# Patient Record
Sex: Male | Born: 1960 | Race: White | Hispanic: No | Smoking: Never smoker
Health system: Southern US, Community
[De-identification: ages and names within clinical notes are randomized; demographics above are authoritative.]

## PROBLEM LIST (undated history)

## (undated) DIAGNOSIS — I2510 Atherosclerotic heart disease of native coronary artery without angina pectoris: Secondary | ICD-10-CM

## (undated) DIAGNOSIS — M109 Gout, unspecified: Secondary | ICD-10-CM

## (undated) DIAGNOSIS — I255 Ischemic cardiomyopathy: Secondary | ICD-10-CM

## (undated) DIAGNOSIS — G4730 Sleep apnea, unspecified: Secondary | ICD-10-CM

## (undated) DIAGNOSIS — E785 Hyperlipidemia, unspecified: Secondary | ICD-10-CM

## (undated) HISTORY — DX: Gout, unspecified: M10.9

## (undated) HISTORY — PX: CARDIAC CATHETERIZATION: SHX172

## (undated) HISTORY — PX: KNEE SURGERY: SHX244

## (undated) HISTORY — PX: KNEE ARTHROSCOPY: SHX127

## (undated) HISTORY — DX: Sleep apnea, unspecified: G47.30

## (undated) HISTORY — PX: TONSILLECTOMY AND ADENOIDECTOMY: SUR1326

---

## 1998-10-21 ENCOUNTER — Emergency Department (HOSPITAL_COMMUNITY): Admission: EM | Admit: 1998-10-21 | Discharge: 1998-10-21 | Payer: Self-pay | Admitting: Emergency Medicine

## 1998-11-30 ENCOUNTER — Emergency Department (HOSPITAL_COMMUNITY): Admission: EM | Admit: 1998-11-30 | Discharge: 1998-11-30 | Payer: Self-pay | Admitting: Internal Medicine

## 1999-01-09 ENCOUNTER — Ambulatory Visit (HOSPITAL_COMMUNITY): Admission: RE | Admit: 1999-01-09 | Discharge: 1999-01-09 | Payer: Self-pay

## 1999-10-29 ENCOUNTER — Encounter: Payer: Self-pay | Admitting: Emergency Medicine

## 1999-10-29 ENCOUNTER — Emergency Department (HOSPITAL_COMMUNITY): Admission: EM | Admit: 1999-10-29 | Discharge: 1999-10-29 | Payer: Self-pay | Admitting: *Deleted

## 2000-06-23 ENCOUNTER — Encounter: Admission: RE | Admit: 2000-06-23 | Discharge: 2000-09-21 | Payer: Self-pay | Admitting: Internal Medicine

## 2003-01-13 ENCOUNTER — Encounter: Payer: Self-pay | Admitting: Internal Medicine

## 2003-01-13 ENCOUNTER — Encounter: Admission: RE | Admit: 2003-01-13 | Discharge: 2003-01-13 | Payer: Self-pay | Admitting: Internal Medicine

## 2004-03-29 ENCOUNTER — Emergency Department (HOSPITAL_COMMUNITY): Admission: EM | Admit: 2004-03-29 | Discharge: 2004-03-29 | Payer: Self-pay | Admitting: Emergency Medicine

## 2006-08-18 ENCOUNTER — Emergency Department (HOSPITAL_COMMUNITY): Admission: EM | Admit: 2006-08-18 | Discharge: 2006-08-19 | Payer: Self-pay | Admitting: Emergency Medicine

## 2009-05-06 HISTORY — PX: SHOULDER SURGERY: SHX246

## 2009-10-05 ENCOUNTER — Ambulatory Visit (HOSPITAL_COMMUNITY): Admission: RE | Admit: 2009-10-05 | Discharge: 2009-10-05 | Payer: Self-pay | Admitting: Specialist

## 2010-07-23 LAB — CBC
MCHC: 34.3 g/dL (ref 30.0–36.0)
RBC: 5.17 MIL/uL (ref 4.22–5.81)
WBC: 5 10*3/uL (ref 4.0–10.5)

## 2010-07-23 LAB — BASIC METABOLIC PANEL
CO2: 28 mEq/L (ref 19–32)
Calcium: 9.3 mg/dL (ref 8.4–10.5)
GFR calc non Af Amer: >60 mL/min (ref >60)
Potassium: 4.1 mEq/L (ref 3.5–5.1)

## 2011-11-28 ENCOUNTER — Ambulatory Visit
Admission: RE | Admit: 2011-11-28 | Discharge: 2011-11-28 | Disposition: A | Discharge status: Home / Self Care | Payer: BC Managed Care – PPO | Source: Ambulatory Visit | Attending: Internal Medicine | Admitting: Internal Medicine

## 2011-11-28 ENCOUNTER — Other Ambulatory Visit: Payer: Self-pay | Admitting: Internal Medicine

## 2011-11-28 DIAGNOSIS — R609 Edema, unspecified: Secondary | ICD-10-CM

## 2011-11-28 DIAGNOSIS — R52 Pain, unspecified: Secondary | ICD-10-CM

## 2013-01-22 ENCOUNTER — Ambulatory Visit: Payer: BC Managed Care – PPO | Admitting: Family Medicine

## 2013-02-17 ENCOUNTER — Telehealth: Payer: Self-pay

## 2013-02-17 NOTE — Telephone Encounter (Signed)
New Patient-LM for CB 

## 2013-02-18 ENCOUNTER — Encounter: Payer: Self-pay | Admitting: Family Medicine

## 2013-02-18 ENCOUNTER — Ambulatory Visit (INDEPENDENT_AMBULATORY_CARE_PROVIDER_SITE_OTHER): Payer: BC Managed Care – PPO | Admitting: Family Medicine

## 2013-02-18 VITALS — BP 114/64 | HR 86 | Temp 97.8°F | Ht 71.0 in | Wt 222.0 lb

## 2013-02-18 DIAGNOSIS — Z23 Encounter for immunization: Secondary | ICD-10-CM

## 2013-02-18 DIAGNOSIS — E669 Obesity, unspecified: Secondary | ICD-10-CM | POA: Insufficient documentation

## 2013-02-18 DIAGNOSIS — M109 Gout, unspecified: Secondary | ICD-10-CM

## 2013-02-18 DIAGNOSIS — Z0000 Encounter for general adult medical examination without abnormal findings: Secondary | ICD-10-CM

## 2013-02-18 LAB — HEPATIC FUNCTION PANEL
ALT: 23 U/L (ref 0–53)
Bilirubin, Direct: 0.1 mg/dL (ref 0.0–0.3)
Total Bilirubin: 0.9 mg/dL (ref 0.3–1.2)

## 2013-02-18 LAB — CBC WITH DIFFERENTIAL/PLATELET
Basophils Absolute: 0 10*3/uL (ref 0.0–0.1)
Basophils Relative: 0.5 % (ref 0.0–3.0)
Eosinophils Relative: 2 % (ref 0.0–5.0)
HCT: 45.6 % (ref 39.0–52.0)
Lymphocytes Relative: 21.7 % (ref 12.0–46.0)
MCHC: 34.8 g/dL (ref 30.0–36.0)
MCV: 85.8 fl (ref 78.0–100.0)
Monocytes Absolute: 0.6 10*3/uL (ref 0.1–1.0)
Monocytes Relative: 11.2 % (ref 3.0–12.0)
Platelets: 209 10*3/uL (ref 150.0–400.0)
RDW: 12.6 % (ref 11.5–14.6)
WBC: 5.4 10*3/uL (ref 4.5–10.5)

## 2013-02-18 LAB — POCT URINALYSIS DIPSTICK
Glucose, UA: NEGATIVE
Protein, UA: NEGATIVE
Spec Grav, UA: 1.01
Urobilinogen, UA: 0.2
pH, UA: 6

## 2013-02-18 LAB — BASIC METABOLIC PANEL
Chloride: 101 mEq/L (ref 96–112)
GFR: 83.82 mL/min (ref >60.00)
Sodium: 139 mEq/L (ref 135–145)

## 2013-02-18 LAB — LIPID PANEL
Cholesterol: 228 mg/dL — ABNORMAL HIGH (ref 0–200)
Total CHOL/HDL Ratio: 6
Triglycerides: 170 mg/dL — ABNORMAL HIGH (ref 0.0–149.0)

## 2013-02-18 LAB — URIC ACID: Uric Acid, Serum: 7.8 mg/dL (ref 4.0–7.8)

## 2013-02-18 LAB — TSH: TSH: 1.91 u[IU]/mL (ref 0.35–5.50)

## 2013-02-18 LAB — PSA: PSA: 0.82 ng/mL (ref 0.10–4.00)

## 2013-02-18 MED ORDER — ALLOPURINOL 100 MG PO TABS: 100.0000 mg | ORAL_TABLET | Freq: Every day | ORAL | Status: DC

## 2013-02-18 NOTE — Assessment & Plan Note (Signed)
Check uric acid. 

## 2013-02-18 NOTE — Progress Notes (Signed)
Subjective:    Patient ID: Leroy Sou., male    DOB: 07-01-1960, 52 y.o.   MRN: [id number]  HPI Pt here for cpe  No complaintsl  Past Medical History  Diagnosis Date  . Gout    Family History  Problem Relation Age of Onset  . Heart disease Maternal Grandfather   . Diabetes Paternal Grandmother   . Heart disease Mother     Blockage   History   Social History  . Marital Status: Married    Spouse Name: N/A    Number of Children: N/A  . Years of Education: N/A   Occupational History  . Not on file.   Social History Main Topics  . Smoking status: Never Smoker   . Smokeless tobacco: Never Used  . Alcohol Use: Yes     Comment: Rarely  . Drug Use: No  . Sexual Activity: Yes    Partners: Female   Other Topics Concern  . Not on file   Social History Narrative   Exercise-- bike, coaching basketball,  softball   Family History  Problem Relation Age of Onset  . Heart disease Maternal Grandfather   . Diabetes Paternal Grandmother   . Heart disease Mother     Blockage   No current outpatient prescriptions on file prior to visit.   No current facility-administered medications on file prior to visit.     Review of Systems                                                                                                                                                                                                                                                                         \\\\\\\\\\\   Review of Systems  Constitutional: Negative for activity change, appetite change and fatigue.  HENT: Negative for hearing loss, congestion, tinnitus and ear discharge.  dentist q69m Eyes: Negative for visual disturbance (see optho q1y -- vision corrected to 20/20 with glasses).  Respiratory: Negative for cough, chest tightness and shortness of breath.   Cardiovascular: Negative for chest pain, palpitations and leg swelling.  Gastrointestinal: Negative for abdominal  pain, diarrhea, constipation and abdominal distention.  Genitourinary: Negative for urgency, frequency, decreased urine volume and difficulty urinating.  Musculoskeletal: Negative for back pain, arthralgias and gait problem.  Skin: Negative for color change, pallor and rash.  Neurological: Negative for dizziness, light-headedness, numbness and headaches.  Hematological: Negative for adenopathy. Does not bruise/bleed easily.  Psychiatric/Behavioral: Negative for suicidal ideas, confusion, sleep disturbance, self-injury, dysphoric mood, decreased concentration and agitation.       }     Objective:   Physical Exam BP 114/64  Pulse 86  Temp(Src) 97.8 F (36.6 C) (Oral)  Ht 5\' 11"  (1.803 m)  Wt 222 lb (100.699 kg)  BMI 30.98 kg/m2  SpO2 98% General appearance: alert, cooperative, appears stated age and no distress Head: Normocephalic, without obvious abnormality, atraumatic Eyes: conjunctivae/corneas clear. PERRL, EOM's intact. Fundi benign. Ears: normal TM's and external ear canals both ears Nose: Nares normal. Septum midline. Mucosa normal. No drainage or sinus tenderness. Throat: lips, mucosa, and tongue normal; teeth and gums normal Neck: no adenopathy, no carotid bruit, no JVD, supple, symmetrical, trachea midline and thyroid not enlarged, symmetric, no tenderness/mass/nodules Back: symmetric, no curvature. ROM normal. No CVA tenderness. Lungs: clear to auscultation bilaterally Chest wall: no tenderness Heart: regular rate and rhythm, S1, S2 normal, no murmur, click, rub or gallop Abdomen: soft, non-tender; bowel sounds normal; no masses,  no organomegaly Male genitalia: normal, penis: no lesions or discharge. testes: no masses or tenderness. no hernias Rectal: normal tone, normal prostate, no masses or tenderness and soft brown guaiac negative stool noted Extremities: extremities normal, atraumatic, no cyanosis or edema Pulses: 2+ and symmetric Skin: Skin color, texture,  turgor normal. No rashes or lesions Lymph nodes: Cervical, supraclavicular, and axillary nodes normal. Neurologic: Alert and oriented X 3, normal strength and tone. Normal symmetric reflexes. Normal coordination and gait Psych- no anxietyl, , no depression        Assessment & Plan:  cpe---ghm utd     Check labs    See avs

## 2013-02-18 NOTE — Telephone Encounter (Signed)
Unable to reach prior to visit  

## 2013-02-18 NOTE — Patient Instructions (Signed)
Preventive Care for Adults, Male  A healthy lifestyle and preventive care can promote health and wellness. Preventive health guidelines for men include the following key practices:  · A routine yearly physical is a good way to check with your caregiver about your health and preventative screening. It is a chance to share any concerns and updates on your health, and to receive a thorough exam.  · Visit your dentist for a routine exam and preventative care every 6 months. Brush your teeth twice a day and floss once a day. Good oral hygiene prevents tooth decay and gum disease.  · The frequency of eye exams is based on your age, health, family medical history, use of contact lenses, and other factors. Follow your caregiver's recommendations for frequency of eye exams.  · Eat a healthy diet. Foods like vegetables, fruits, whole grains, low-fat dairy products, and lean protein foods contain the nutrients you need without too many calories. Decrease your intake of foods high in solid fats, added sugars, and salt. Eat the right amount of calories for you. Get information about a proper diet from your caregiver, if necessary.  · Regular physical exercise is one of the most important things you can do for your health. Most adults should get at least 150 minutes of moderate-intensity exercise (any activity that increases your heart rate and causes you to sweat) each week. In addition, most adults need muscle-strengthening exercises on 2 or more days a week.  · Maintain a healthy weight. The body mass index (BMI) is a screening tool to identify possible weight problems. It provides an estimate of body fat based on height and weight. Your caregiver can help determine your BMI, and can help you achieve or maintain a healthy weight. For adults 20 years and older:  · A BMI below 18.5 is considered underweight.  · A BMI of 18.5 to 24.9 is normal.  · A BMI of 25 to 29.9 is considered overweight.  · A BMI of 30 and above is  considered obese.  · Maintain normal blood lipids and cholesterol levels by exercising and minimizing your intake of saturated fat. Eat a balanced diet with plenty of fruit and vegetables. Blood tests for lipids and cholesterol should begin at age 20 and be repeated every 5 years. If your lipid or cholesterol levels are high, you are over 50, or you are a high risk for heart disease, you may need your cholesterol levels checked more frequently. Ongoing high lipid and cholesterol levels should be treated with medicines if diet and exercise are not effective.  · If you smoke, find out from your caregiver how to quit. If you do not use tobacco, do not start.  · If you choose to drink alcohol, do not exceed 2 drinks per day. One drink is considered to be 12 ounces (355 mL) of beer, 5 ounces (148 mL) of wine, or 1.5 ounces (44 mL) of liquor.  · Avoid use of street drugs. Do not share needles with anyone. Ask for help if you need support or instructions about stopping the use of drugs.  · High blood pressure causes heart disease and increases the risk of stroke. Your blood pressure should be checked at least every 1 to 2 years. Ongoing high blood pressure should be treated with medicines, if weight loss and exercise are not effective.  · If you are 45 to 50 years old, ask your caregiver if you should take aspirin to prevent heart disease.  · Diabetes screening involves taking   a blood sample to check your fasting blood sugar level. This should be done once every 3 years, after age 45, if you are within normal weight and without risk factors for diabetes. Testing should be considered at a younger age or be carried out more frequently if you are overweight and have at least 1 risk factor for diabetes.  · Colorectal cancer can be detected and often prevented. Most routine colorectal cancer screening begins at the age of 50 and continues through age 75. However, your caregiver may recommend screening at an earlier age if you  have risk factors for colon cancer. On a yearly basis, your caregiver may provide home test kits to check for hidden blood in the stool. Use of a small camera at the end of a tube, to directly examine the colon (sigmoidoscopy or colonoscopy), can detect the earliest forms of colorectal cancer. Talk to your caregiver about this at age 50, when routine screening begins.  Direct examination of the colon should be repeated every 5 to 10 years through age 75, unless early forms of pre-cancerous polyps or small growths are found.  · Hepatitis C blood testing is recommended for all people born from 1945 through 1965 and any individual with known risks for hepatitis C.  · Practice safe sex. Use condoms and avoid high-risk sexual practices to reduce the spread of sexually transmitted infections (STIs). STIs include gonorrhea, chlamydia, syphilis, trichomonas, herpes, HPV, and human immunodeficiency virus (HIV). Herpes, HIV, and HPV are viral illnesses that have no cure. They can result in disability, cancer, and death.  · A one-time screening for abdominal aortic aneurysm (AAA) and surgical repair of large AAAs by sound wave imaging (ultrasonography) is recommended for ages 65 to 75 years who are current or former smokers.  · Healthy men should no longer receive prostate-specific antigen (PSA) blood tests as part of routine cancer screening. Consult with your caregiver about prostate cancer screening.  · Testicular cancer screening is not recommended for adult males who have no symptoms. Screening includes self-exam, caregiver exam, and other screening tests. Consult with your caregiver about any symptoms you have or any concerns you have about testicular cancer.  · Use sunscreen with skin protection factor (SPF) of 30 or more. Apply sunscreen liberally and repeatedly throughout the day. You should seek shade when your shadow is shorter than you. Protect yourself by wearing long sleeves, pants, a wide-brimmed hat, and  sunglasses year round, whenever you are outdoors.  · Once a month, do a whole body skin exam, using a mirror to look at the skin on your back. Notify your caregiver of new moles, moles that have irregular borders, moles that are larger than a pencil eraser, or moles that have changed in shape or color.  · Stay current with required immunizations.  · Influenza. You need a dose every fall (or winter). The composition of the flu vaccine changes each year, so being vaccinated once is not enough.  · Pneumococcal polysaccharide. You need 1 to 2 doses if you smoke cigarettes or if you have certain chronic medical conditions. You need 1 dose at age 65 (or older) if you have never been vaccinated.  · Tetanus, diphtheria, pertussis (Tdap, Td). Get 1 dose of Tdap vaccine if you are younger than age 65 years, are over 65 and have contact with an infant, are a healthcare worker, or simply want to be protected from whooping cough. After that, you need a Td booster dose every 10 years. Consult your   caregiver if you have not had at least 3 tetanus and diphtheria-containing shots sometime in your life or have a deep or dirty wound.  · HPV. This vaccine is recommended for males 13 through 50 years of age. This vaccine may be given to men 22 through 50 years of age who have not completed the 3 dose series. It is recommended for men through age 26 who have sex with men or whose immune system is weakened because of HIV infection, other illness, or medications. The vaccine is given in 3 doses over 6 months.  · Measles, mumps, rubella (MMR). You need at least 1 dose of MMR if you were born in 1957 or later. You may also need a 2nd dose.  · Meningococcal. If you are age 19 to 21 years and a first-year college student living in a residence hall, or have one of several medical conditions, you need to get vaccinated against meningococcal disease. You may also need additional booster doses.  · Zoster (shingles). If you are age 60 years or  older, you should get this vaccine.  · Varicella (chickenpox). If you have never had chickenpox or you were vaccinated but received only 1 dose, talk to your caregiver to find out if you need this vaccine.  · Hepatitis A. You need this vaccine if you have a specific risk factor for hepatitis A virus infection, or you simply wish to be protected from this disease. The vaccine is usually given as 2 doses, 6 to 18 months apart.  · Hepatitis B. You need this vaccine if you have a specific risk factor for hepatitis B virus infection or you simply wish to be protected from this disease. The vaccine is given in 3 doses, usually over 6 months.  Preventative Service / Frequency  Ages 19 to 39  · Blood pressure check.** / Every 1 to 2 years.  · Lipid and cholesterol check.** / Every 5 years beginning at age 20.  · Hepatitis C blood test.** / For any individual with known risks for hepatitis C.  · Skin self-exam. / Monthly.  · Influenza immunization.** / Every year.  · Pneumococcal polysaccharide immunization.** / 1 to 2 doses if you smoke cigarettes or if you have certain chronic medical conditions.  · Tetanus, diphtheria, pertussis (Tdap,Td) immunization. / A one-time dose of Tdap vaccine. After that, you need a Td booster dose every 10 years.  · HPV immunization. / 3 doses over 6 months, if 26 and younger.  · Measles, mumps, rubella (MMR) immunization. / You need at least 1 dose of MMR if you were born in 1957 or later. You may also need a 2nd dose.  · Meningococcal immunization. / 1 dose if you are age 19 to 21 years and a first-year college student living in a residence hall, or have one of several medical conditions, you need to get vaccinated against meningococcal disease. You may also need additional booster doses.  · Varicella immunization.** / Consult your caregiver.  · Hepatitis A immunization.** / Consult your caregiver. 2 doses, 6 to 18 months apart.  · Hepatitis B immunization.** / Consult your caregiver. 3 doses  usually over 6 months.  Ages 40 to 64  · Blood pressure check.** / Every 1 to 2 years.  · Lipid and cholesterol check.** / Every 5 years beginning at age 20.  · Fecal occult blood test (FOBT) of stool. / Every year beginning at age 50 and continuing until age 75. You may not have   to do this test if you get colonoscopy every 10 years.  · Flexible sigmoidoscopy** or colonoscopy.** / Every 5 years for a flexible sigmoidoscopy or every 10 years for a colonoscopy beginning at age 50 and continuing until age 75.  · Hepatitis C blood test.** / For all people born from 1945 through 1965 and any individual with known risks for hepatitis C.  · Skin self-exam. / Monthly.  · Influenza immunization.** / Every year.  · Pneumococcal polysaccharide immunization.** / 1 to 2 doses if you smoke cigarettes or if you have certain chronic medical conditions.  · Tetanus, diphtheria, pertussis (Tdap/Td) immunization.** / A one-time dose of Tdap vaccine. After that, you need a Td booster dose every 10 years.  · Measles, mumps, rubella (MMR) immunization.  / You need at least 1 dose of MMR if you were born in 1957 or later. You may also need a 2nd dose.  · Varicella immunization.**/ Consult your caregiver.  · Meningococcal immunization.** / Consult your caregiver.  · Hepatitis A immunization.** / Consult your caregiver. 2 doses, 6 to 18 months apart.  · Hepatitis B immunization.** / Consult your caregiver. 3 doses, usually over 6 months.  Ages 65 and over  · Blood pressure check.** / Every 1 to 2 years.  · Lipid and cholesterol check.**/ Every 5 years beginning at age 20.  · Fecal occult blood test (FOBT) of stool. / Every year beginning at age 50 and continuing until age 75. You may not have to do this test if you get colonoscopy every 10 years.  · Flexible sigmoidoscopy** or colonoscopy.** / Every 5 years for a flexible sigmoidoscopy or every 10 years for a colonoscopy beginning at age 50 and continuing until age 75.  · Hepatitis C blood  test.** / For all people born from 1945 through 1965 and any individual with known risks for hepatitis C.  · Abdominal aortic aneurysm (AAA) screening.** / A one-time screening for ages 65 to 75 years who are current or former smokers.  · Skin self-exam. / Monthly.  · Influenza immunization.** / Every year.  · Pneumococcal polysaccharide immunization.** / 1 dose at age 65 (or older) if you have never been vaccinated.  · Tetanus, diphtheria, pertussis (Tdap, Td) immunization. / A one-time dose of Tdap vaccine if you are over 65 and have contact with an infant, are a healthcare worker, or simply want to be protected from whooping cough. After that, you need a Td booster dose every 10 years.  · Varicella immunization. ** / Consult your caregiver.  · Meningococcal immunization.** / Consult your caregiver.  · Hepatitis A immunization. ** / Consult your caregiver. 2 doses, 6 to 18 months apart.  · Hepatitis B immunization.** / Check with your caregiver. 3 doses, usually over 6 months.  **Family history and personal history of risk and conditions may change your caregiver's recommendations.  Document Released: 06/18/2001 Document Revised: 07/15/2011 Document Reviewed: 09/17/2010  [employer]® Patient Information ©2014 [employer].

## 2013-04-06 ENCOUNTER — Telehealth: Payer: Self-pay | Admitting: Family Medicine

## 2013-04-06 DIAGNOSIS — Z0000 Encounter for general adult medical examination without abnormal findings: Secondary | ICD-10-CM

## 2013-04-06 DIAGNOSIS — E785 Hyperlipidemia, unspecified: Secondary | ICD-10-CM

## 2013-04-06 MED ORDER — ALLOPURINOL 100 MG PO TABS: 200.0000 mg | ORAL_TABLET | Freq: Every day | ORAL | Status: DC

## 2013-04-06 NOTE — Telephone Encounter (Signed)
New Rx sent. Orders are in     [place]

## 2013-04-06 NOTE — Telephone Encounter (Addendum)
Patient wife called to request a refill for allopurinol (ZYLOPRIM) 100 MG tablet but states that it suppose to be 2 tablets twice a day according to dr Laury Axon message about his recent lab work. Also patient wife states that he is due for a 3 month lab apt in January.   Pharmacy [facility] on [address]

## 2013-05-21 ENCOUNTER — Other Ambulatory Visit: Payer: BC Managed Care – PPO

## 2013-05-25 ENCOUNTER — Other Ambulatory Visit (INDEPENDENT_AMBULATORY_CARE_PROVIDER_SITE_OTHER): Payer: BC Managed Care – PPO

## 2013-05-25 DIAGNOSIS — E785 Hyperlipidemia, unspecified: Secondary | ICD-10-CM

## 2013-05-25 LAB — BASIC METABOLIC PANEL
BUN: 12 mg/dL (ref 6–23)
CALCIUM: 9.2 mg/dL (ref 8.4–10.5)
CO2: 28 mEq/L (ref 19–32)
Chloride: 104 mEq/L (ref 96–112)
Creatinine, Ser: 1 mg/dL (ref 0.4–1.5)
GFR: 82.78 mL/min (ref >60.00)
Glucose, Bld: 121 mg/dL — ABNORMAL HIGH (ref 70–99)
POTASSIUM: 4.1 meq/L (ref 3.5–5.1)
SODIUM: 138 meq/L (ref 135–145)

## 2013-05-25 LAB — LIPID PANEL
CHOL/HDL RATIO: 5
CHOLESTEROL: 202 mg/dL — AB (ref 0–200)
HDL: 38.2 mg/dL — AB (ref >39.00)
TRIGLYCERIDES: 153 mg/dL — AB (ref 0.0–149.0)
VLDL: 30.6 mg/dL (ref 0.0–40.0)

## 2013-05-25 LAB — HEPATIC FUNCTION PANEL
ALK PHOS: 98 U/L (ref 39–117)
ALT: 36 U/L (ref 0–53)
AST: 36 U/L (ref 0–37)
Albumin: 4.3 g/dL (ref 3.5–5.2)
BILIRUBIN TOTAL: 1.1 mg/dL (ref 0.3–1.2)
Bilirubin, Direct: 0.1 mg/dL (ref 0.0–0.3)
Total Protein: 7.2 g/dL (ref 6.0–8.3)

## 2013-05-25 LAB — LDL CHOLESTEROL, DIRECT: LDL DIRECT: 142 mg/dL

## 2013-05-27 ENCOUNTER — Ambulatory Visit (INDEPENDENT_AMBULATORY_CARE_PROVIDER_SITE_OTHER): Payer: BC Managed Care – PPO | Admitting: Physician Assistant

## 2013-05-27 ENCOUNTER — Encounter: Payer: Self-pay | Admitting: Physician Assistant

## 2013-05-27 VITALS — BP 118/80 | HR 60 | Temp 98.4°F | Wt 222.8 lb

## 2013-05-27 DIAGNOSIS — J209 Acute bronchitis, unspecified: Secondary | ICD-10-CM

## 2013-05-27 MED ORDER — HYDROCOD POLST-CHLORPHEN POLST 10-8 MG/5ML PO LQCR: 5.0000 mL | Freq: Two times a day (BID) | ORAL | Status: DC | PRN

## 2013-05-27 MED ORDER — AZITHROMYCIN 250 MG PO TABS: ORAL_TABLET | ORAL | Status: DC

## 2013-05-27 NOTE — Patient Instructions (Signed)
Take medications as prescribed.  Use Mucinex for chest congestion.  Increase fluid intake.  Rest.  Saline nasal spray.  Take a multivitamin daily. Please call or return to clinic if symptoms not improving.

## 2013-05-28 DIAGNOSIS — J209 Acute bronchitis, unspecified: Secondary | ICD-10-CM | POA: Insufficient documentation

## 2013-05-28 NOTE — Progress Notes (Signed)
Patient presents to clinic today c/o sinus pressure and sinus pain for the past few days. Patient also endorses a cough that is occasionally productive. Patient endorses fever, and bilateral ear pain and tooth pain. Patient denies recent travel sick contact. Denies history of asthma or significant allergies. Patient does state he has pneumonia in the past as a child to get checked out in the office as soon as the symptoms start, to prevent getting "full blown" pneumonia.  Past Medical History  Diagnosis Date  . Gout     Current Outpatient Prescriptions on File Prior to Visit  Medication Sig Dispense Refill  . allopurinol (ZYLOPRIM) 100 MG tablet Take 2 tablets (200 mg total) by mouth daily.  180 tablet  1  . indomethacin (INDOCIN) 50 MG capsule Take 50 mg by mouth 2 (two) times daily with a meal.       No current facility-administered medications on file prior to visit.    No Known Allergies  Family History  Problem Relation Age of Onset  . Heart disease Maternal Grandfather   . Diabetes Paternal Grandmother   . Heart disease Mother     Blockage    History   Social History  . Marital Status: Married    Spouse Name: N/A    Number of Children: N/A  . Years of Education: N/A   Social History Main Topics  . Smoking status: Never Smoker   . Smokeless tobacco: Never Used  . Alcohol Use: Yes     Comment: Rarely  . Drug Use: No  . Sexual Activity: Yes    Partners: Female   Other Topics Concern  . None   Social History Narrative   Exercise-- bike, coaching basketball,  softball   Review of Systems - See HPI.  All other ROS are negative.  Filed Vitals:   05/27/13 0906  BP: 118/80  Pulse: 60  Temp: 98.4 F (36.9 C)   Physical Exam  Vitals reviewed. Constitutional: He is oriented to person, place, and time and well-developed, well-nourished, and in no distress.  HENT:  Head: Normocephalic and atraumatic.  Right Ear: External ear normal.  Left Ear: External ear  normal.  Nose: Nose normal.  Mouth/Throat: Oropharynx is clear and moist. No oropharyngeal exudate.  Tympanic membranes within normal limits bilaterally. Tenderness noted on percussion of sinuses.  Eyes: Conjunctivae are normal. Pupils are equal, round, and reactive to light.  Neck: Neck supple.  Cardiovascular: Normal rate, regular rhythm, normal heart sounds and intact distal pulses.   Pulmonary/Chest: Effort normal and breath sounds normal. No respiratory distress. He has no wheezes. He has no rales. He exhibits no tenderness.  Lymphadenopathy:    He has no cervical adenopathy.  Neurological: He is alert and oriented to person, place, and time.  Skin: Skin is warm and dry. No rash noted.  Psychiatric: Affect normal.    Recent Results (from the past 2160 hour(s))  LIPID PANEL     Status: Abnormal   Collection Time    05/25/13  8:44 AM      Result Value Range   Cholesterol 202 (*) 0 - 200 mg/dL   Comment: ATP III Classification       Desirable:  < 200 mg/dL               Borderline High:  200 - 239 mg/dL          High:  > = 240 mg/dL   Triglycerides 153.0 (*) 0.0 - 149.0 mg/dL  Comment: Normal:  <150 mg/dLBorderline High:  150 - 199 mg/dL   HDL 38.20 (*) >39.00 mg/dL   VLDL 30.6  0.0 - 40.0 mg/dL   Total CHOL/HDL Ratio 5     Comment:                Men          Women1/2 Average Risk     3.4          3.3Average Risk          5.0          4.42X Average Risk          9.6          7.13X Average Risk          15.0          11.0                      HEPATIC FUNCTION PANEL     Status: None   Collection Time    05/25/13  8:44 AM      Result Value Range   Total Bilirubin 1.1  0.3 - 1.2 mg/dL   Bilirubin, Direct 0.1  0.0 - 0.3 mg/dL   Alkaline Phosphatase 98  39 - 117 U/L   AST 36  0 - 37 U/L   ALT 36  0 - 53 U/L   Total Protein 7.2  6.0 - 8.3 g/dL   Albumin 4.3  3.5 - 5.2 g/dL  BASIC METABOLIC PANEL     Status: Abnormal   Collection Time    05/25/13  8:44 AM      Result Value Range    Sodium 138  135 - 145 mEq/L   Potassium 4.1  3.5 - 5.1 mEq/L   Chloride 104  96 - 112 mEq/L   CO2 28  19 - 32 mEq/L   Glucose, Bld 121 (*) 70 - 99 mg/dL   BUN 12  6 - 23 mg/dL   Creatinine, Ser 1.0  0.4 - 1.5 mg/dL   Calcium 9.2  8.4 - 10.5 mg/dL   GFR 82.78  >60.00 mL/min  LDL CHOLESTEROL, DIRECT     Status: None   Collection Time    05/25/13  8:44 AM      Result Value Range   Direct LDL 142.0     Comment: Optimal:  <100 mg/dLNear or Above Optimal:  100-129 mg/dLBorderline High:  130-159 mg/dLHigh:  160-189 mg/dLVery High:  >190 mg/dL    Assessment/Plan: No problem-specific assessment & plan notes found for this encounter.

## 2013-05-28 NOTE — Assessment & Plan Note (Signed)
Rx azithromycin. Rx Tussionex. Increase fluid intake. Rest. Saline nasal spray. Probiotic. Mucinex as needed for congestion. Humidifier in bedroom. Call or return to clinic if symptoms not improving.

## 2013-07-26 ENCOUNTER — Other Ambulatory Visit: Payer: Self-pay | Admitting: Family Medicine

## 2013-09-13 ENCOUNTER — Ambulatory Visit (INDEPENDENT_AMBULATORY_CARE_PROVIDER_SITE_OTHER): Payer: No Typology Code available for payment source | Admitting: Interventional Cardiology

## 2013-09-13 ENCOUNTER — Encounter: Payer: Self-pay | Admitting: Interventional Cardiology

## 2013-09-13 VITALS — BP 130/70 | HR 68

## 2013-09-13 DIAGNOSIS — R9431 Abnormal electrocardiogram [ECG] [EKG]: Secondary | ICD-10-CM

## 2013-09-13 DIAGNOSIS — K219 Gastro-esophageal reflux disease without esophagitis: Secondary | ICD-10-CM

## 2013-09-13 DIAGNOSIS — R0789 Other chest pain: Secondary | ICD-10-CM

## 2013-09-13 DIAGNOSIS — R079 Chest pain, unspecified: Secondary | ICD-10-CM

## 2013-09-13 LAB — TROPONIN I: Troponin I: 0.01 ng/mL (ref <0.06)

## 2013-09-13 MED ORDER — ASPIRIN EC 81 MG PO TBEC: 81.0000 mg | DELAYED_RELEASE_TABLET | Freq: Every day | ORAL | Status: DC

## 2013-09-13 NOTE — Patient Instructions (Signed)
START Aspirin 81mg  daily  Lab Today Troponin  Your physician has requested that you have en exercise stress myoview. For further information please visit [email or web address]. Please follow instruction sheet, as given.

## 2013-09-13 NOTE — Progress Notes (Signed)
Patient ID: Leroy Anger., male   DOB: [birth year], 53 y.o.   MRN: [id number]   Date: 09/13/2013 ID: Leroy Anger., DOB 24-Jul-1960, MRN [id number] PCP: Garnet Koyanagi, DO  Reason: Chest discomfort  ASSESSMENT;  1. Chest discomfort described as a pressure radiating across the chest, present since 09/10/13 2. History of "indigestion" since the mid to late 1980s with a very similar quality of discomfort 3. Hyperlipidemia 4. Right bundle branch block with inferior Q waves. Q waves are more prominent on this EKG done on the prior study done greater than a year ago.   PLAN:  1. aspirin 81 mg per day 2. Troponin I 3. Stress Cardiolite to be done as soon as possible   SUBJECTIVE: Leroy Chaloux. is a 53 y.o. male who is a 53 year old gentleman in good health with the exception of gout. He has also had other musculoskeletal/joint problems related to an active lifestyle and sports . He has a long-standing history of "indigestion". He describes this as being a relatively severe midsternal sensation of tightness. Starting 3 days ago the discomfort had an additional component which included radiation across the precordium and into the back. His been continuously present and is gradually decreasing in intensity. There is no diaphoresis with extreme fatigue. No nausea or vomiting. He feels better today than he did through the weekend.    No Known Allergies  Current Outpatient Prescriptions on File Prior to Visit  Medication Sig Dispense Refill  . allopurinol (ZYLOPRIM) 100 MG tablet Take 2 tablets (200 mg total) by mouth daily.  180 tablet  1  . azithromycin (ZITHROMAX) 250 MG tablet Take 2 tablets on Day 1.  Then take 1 tablet daily.  6 tablet  0  . chlorpheniramine-HYDROcodone (TUSSIONEX PENNKINETIC ER) 10-8 MG/5ML LQCR Take 5 mLs by mouth every 12 (twelve) hours as needed.  115 mL  0  . indomethacin (INDOCIN) 50 MG capsule TAKE 1 CAPSULE BY MOUTH TWICE DAILY WITH FOOD AS NEEDED  60  capsule  0   No current facility-administered medications on file prior to visit.    Past Medical History  Diagnosis Date  . Gout     Past Surgical History  Procedure Laterality Date  . Tonsillectomy and adenoidectomy    . Shoulder surgery Right 2011  . Knee surgery Bilateral     x's 4-- 2 surgeries on both knees    History   Social History  . Marital Status: Married    Spouse Name: N/A    Number of Children: N/A  . Years of Education: N/A   Occupational History  . Not on file.   Social History Main Topics  . Smoking status: Never Smoker   . Smokeless tobacco: Never Used  . Alcohol Use: Yes     Comment: Rarely  . Drug Use: No  . Sexual Activity: Yes    Partners: Female   Other Topics Concern  . Not on file   Social History Narrative   Exercise-- bike, coaching basketball,  softball    Family History  Problem Relation Age of Onset  . Heart disease Maternal Grandfather   . Diabetes Paternal Grandmother   . Heart disease Mother     Blockage    ROS:  denies dyspnea, palpitations, syncope, orthopnea, PND, transient neurological complaints, edema, claudication, and abdominal pain. . Other systems negative for complaints.  OBJECTIVE: BP 130/70  Pulse 68,  General: No acute distress,  any appearing  HEENT: normal without pallor  or jaundice Neck: JVD flat. Carotids absent Chest: Clear Cardiac: Murmur: None. Gallop: None. Rhythm: Normal. Other: Normal Abdomen: Bruit: Absent. Pulsation: Absent Extremities: Edema: None. Pulses: 2+ and symmetric upper and lower Neuro: Normal Psych: Normal  ECG: ECG reveals normal sinus rhythm, right bundle branch block, inferior Q waves compatible with prior infarction. Compared to a prior tracing the inferior Q waves are more prominent.

## 2013-09-14 ENCOUNTER — Ambulatory Visit (HOSPITAL_COMMUNITY): Payer: No Typology Code available for payment source | Attending: Cardiovascular Disease | Admitting: Radiology

## 2013-09-14 VITALS — BP 119/74 | HR 55 | Ht 74.0 in | Wt 216.0 lb

## 2013-09-14 DIAGNOSIS — R079 Chest pain, unspecified: Secondary | ICD-10-CM | POA: Insufficient documentation

## 2013-09-14 DIAGNOSIS — R0602 Shortness of breath: Secondary | ICD-10-CM

## 2013-09-14 MED ORDER — TECHNETIUM TC 99M SESTAMIBI GENERIC - CARDIOLITE
10.8000 | Freq: Once | INTRAVENOUS | Status: AC | PRN
  Administered 2013-09-14: 11 via INTRAVENOUS

## 2013-09-14 MED ORDER — TECHNETIUM TC 99M SESTAMIBI GENERIC - CARDIOLITE
33.0000 | Freq: Once | INTRAVENOUS | Status: AC | PRN
  Administered 2013-09-14: 33 via INTRAVENOUS

## 2013-09-14 NOTE — Progress Notes (Signed)
[facility] [address] [place], [place] [address] [phone]    Cardiology Nuclear Med Study  Novak Stgermaine. is a 53 y.o. male     MRN : [id number]     DOB: Oct 13, 1960  Procedure Date: 09/14/2013  Nuclear Med Background Indication for Stress Test:  Evaluation for Ischemia History:  No known CAD Cardiac Risk Factors: none  Symptoms:  Chest Pain, Chest Pain with Exertion (last date of chest discomfort was today 2/10), DOE and Fatigue   Nuclear Pre-Procedure Caffeine/Decaff Intake:  7:00pm NPO After: 8:00am   Lungs:  clear O2 Sat: 97% on room air. IV 0.9% NS with Angio Cath:  20g  IV Site: R Hand  IV Started by:  Matilde Haymaker, RN  Chest Size (in):  48 Cup Size: n/a  Height: 6\' 2"  (1.88 m)  Weight:  216 lb (97.977 kg)  BMI:  Body mass index is 27.72 kg/(m^2). Tech Comments:  n/a    Nuclear Med Study 1 or 2 day study: 1 day  Stress Test Type:  Stress  Reading MD: n/a  Order Authorizing Provider:  Mallie Mussel Smith,MD  Resting Radionuclide: Technetium 62m Sestamibi  Resting Radionuclide Dose: 11.0 mCi   Stress Radionuclide:  Technetium 2m Sestamibi  Stress Radionuclide Dose:33.0 mCi           Stress Protocol Rest HR: 55 Stress HR: 155  Rest BP: 119/74 Stress BP: 186/64  Exercise Time (min): 9:00 METS: 10.1           Dose of Adenosine (mg):  n/a Dose of Lexiscan: n/a mg  Dose of Atropine (mg): n/a Dose of Dobutamine: n/a mcg/kg/min (at max HR)  Stress Test Technologist: Glade Lloyd, BS-ES  Nuclear Technologist:  Charlton Amor, CNMT     Rest Procedure:  Myocardial perfusion imaging was performed at rest 45 minutes following the intravenous administration of Technetium 35m Sestamibi. Rest ECG: NSR-RBBB  Stress Procedure:  The patient exercised on the treadmill utilizing the Bruce Protocol for 9:00 minutes. The patient stopped due to fatigue and 6/10 chest tightness.  Technetium 55m Sestamibi was injected at peak exercise and  myocardial perfusion imaging was performed after a brief delay. Stress ECG: At rest, nonspecific T-wave change in V3, when standing, mild T wave inversion noted in V3 which was accentuated during exercise.   QPS Raw Data Images:  Mild diaphragmatic attenuation; normal left ventricular size. Stress Images:  There is decreased uptake, basal inferior distribution, likely diaphragmatic attenuation. Rest Images:  There is decreased uptake basal inferior distribution, likely diaphragmatic attenuation. Subtraction (SDS):  1. No significant ischemia identified. Transient Ischemic Dilatation (Normal <1.22):  0.91 Lung/Heart Ratio (Normal <0.45):  0.36  Quantitative Gated Spect Images QGS EDV:  116 ml QGS ESV:  51 ml  Impression Exercise Capacity:  Good exercise capacity. BP Response:  Normal blood pressure response. Clinical Symptoms:  At the beginning of study experienced 3/10 chest tightness, throughout exercise had mild increased shortness of breath and 6/10 chest tightness. Chest tightness resolved during rest. He began study with mild midsternal dull ache which returned back to baseline. ECG Impression:  No significant ST segment change suggestive of ischemia. Comparison with Prior Nuclear Study: No previous nuclear study performed  Overall Impression:  Low risk stress nuclear study with no areas of ischemia identified..  LV Ejection Fraction: 56%.  LV Wall Motion:  NL LV Function; NL Wall Motion. Discussed with Dr. Tamala Julian.   Candee Furbish, MD

## 2013-09-30 ENCOUNTER — Ambulatory Visit (INDEPENDENT_AMBULATORY_CARE_PROVIDER_SITE_OTHER): Payer: No Typology Code available for payment source | Admitting: Interventional Cardiology

## 2013-09-30 ENCOUNTER — Encounter: Payer: Self-pay | Admitting: Interventional Cardiology

## 2013-09-30 VITALS — BP 122/68 | HR 76 | Ht 74.0 in | Wt 215.0 lb

## 2013-09-30 DIAGNOSIS — R9431 Abnormal electrocardiogram [ECG] [EKG]: Secondary | ICD-10-CM

## 2013-09-30 DIAGNOSIS — R079 Chest pain, unspecified: Secondary | ICD-10-CM

## 2013-09-30 DIAGNOSIS — E669 Obesity, unspecified: Secondary | ICD-10-CM

## 2013-09-30 NOTE — Patient Instructions (Addendum)
Your physician recommends that you continue on your current medications as directed. Please refer to the Current Medication list given to you today.  You are scheduled for a cardiac catheterization on 10/19/13 with Dr. Tamala Julian   Go to [facility] [place] on 6/16 at 7:00 am Enter thru the [facility] entrance A No food or drink after midnight on 10/18/13 You may take your medications with a sip of water on the day of your procedure.   You will need to come back on 10/12/13 for labs, ok to eat and drink that day You will need to get a chest Xray at [facility]

## 2013-09-30 NOTE — Progress Notes (Signed)
Patient ID: Leroy D Rodeheaver Jr., male   DOB: 06/20/1960, 53 y.o.   MRN: [id number]    [address] [place],   [address] Phone: [phone] Fax:  [phone]  Date:  09/30/2013   ID:  Leroy D Amer Jr., DOB 08/02/1960, MRN [id number]  PCP:  Yvonne Lowne, DO   ASSESSMENT:  1. Chest pressure, recurrent with recent history of low risk myocardial perfusion study but EKG demonstrating inferior Q waves compatible with infarction 2. Gout 3. History of gastric esophageal reflux  PLAN:  1. I have recommended that Lamontae undergo coronary angiography given ongoing complaints and no suitable explanation for the chest tightness with exertion. The appearance of the baseline EKG which shows right bundle with inferior Q waves and the "diaphragm attenuation" all myocardial perfusion imaging could indicate right coronary artery disease. I have recommended angiography to define anatomy and help guide therapy. The procedure including the risk of stroke, death, myocardial infarction, bleeding, kidney injury, limb ischemia, among others were discussed in detail with the patient and accepted.   SUBJECTIVE: Leroy D Pellicano Jr. is a 53 y.o. male who over the past month has suffered with exertion related chest pressure. Myocardial perfusion imaging done recently demonstrated a moderate inferior wall defect in that was interpreted as diaphragm attenuation. Over the past 2 weeks the patient's symptoms have gradually improved. The patient's electrocardiogram done on the first visit with me demonstrated inferior Q waves in the setting of a right bundle branch block. The Q waves are new. He has some exertional fatigue. There is mild dyspnea on exertion.   Wt Readings from Last 3 Encounters:  09/30/13 215 lb (97.523 kg)  09/14/13 216 lb (97.977 kg)  05/27/13 222 lb 12 oz (101.039 kg)     Past Medical History  Diagnosis Date  . Gout     Current Outpatient Prescriptions  Medication Sig  Dispense Refill  . allopurinol (ZYLOPRIM) 100 MG tablet Take 2 tablets (200 mg total) by mouth daily.  180 tablet  1  . aspirin EC 81 MG tablet Take 1 tablet (81 mg total) by mouth daily.      . indomethacin (INDOCIN) 50 MG capsule TAKE 1 CAPSULE BY MOUTH TWICE DAILY WITH FOOD AS NEEDED  60 capsule  0   No current facility-administered medications for this visit.    Allergies:   No Known Allergies  Social History:  The patient  reports that he has never smoked. He has never used smokeless tobacco. He reports that he drinks alcohol. He reports that he does not use illicit drugs.   ROS:  Please see the history of present illness.   No palpitations or syncope. No claudication.   All other systems reviewed and negative.   OBJECTIVE: VS:  BP 122/68  Pulse 76  Ht 6' 2" (1.88 m)  Wt 215 lb (97.523 kg)  BMI 27.59 kg/m2 Well nourished, well developed, in no acute distress, younger and mildly obese HEENT: normal Neck: JVD flat. Carotid bruit absent  Cardiac:  normal S1, S2; RRR; no murmur Lungs:  clear to auscultation bilaterally, no wheezing, rhonchi or rales Abd: soft, nontender, no hepatomegaly Ext: Edema absent. Pulses 2+ and symmetric Skin: warm and dry Neuro:  CNs 2-12 intact, no focal abnormalities noted  EKG:  Unable to find EKG performed on the last office visit this needs to be repeated prior to cath       Signed, Henry W. B. Smith III, MD 09/30/2013 5:07 PM   

## 2013-10-08 ENCOUNTER — Other Ambulatory Visit: Payer: Self-pay | Admitting: Family Medicine

## 2013-10-12 ENCOUNTER — Other Ambulatory Visit: Payer: No Typology Code available for payment source

## 2013-10-13 ENCOUNTER — Other Ambulatory Visit (INDEPENDENT_AMBULATORY_CARE_PROVIDER_SITE_OTHER): Payer: No Typology Code available for payment source

## 2013-10-13 ENCOUNTER — Ambulatory Visit (INDEPENDENT_AMBULATORY_CARE_PROVIDER_SITE_OTHER)
Admission: RE | Admit: 2013-10-13 | Discharge: 2013-10-13 | Disposition: A | Discharge status: Home / Self Care | Payer: No Typology Code available for payment source | Source: Ambulatory Visit | Attending: Interventional Cardiology | Admitting: Interventional Cardiology

## 2013-10-13 DIAGNOSIS — R079 Chest pain, unspecified: Secondary | ICD-10-CM

## 2013-10-13 LAB — BASIC METABOLIC PANEL
BUN: 12 mg/dL (ref 6–23)
CO2: 27 meq/L (ref 19–32)
Calcium: 9.3 mg/dL (ref 8.4–10.5)
Chloride: 104 mEq/L (ref 96–112)
Creatinine, Ser: 1 mg/dL (ref 0.4–1.5)
GFR: 85.58 mL/min (ref >60.00)
GLUCOSE: 103 mg/dL — AB (ref 70–99)
Potassium: 4 mEq/L (ref 3.5–5.1)
SODIUM: 138 meq/L (ref 135–145)

## 2013-10-13 LAB — CBC WITH DIFFERENTIAL/PLATELET
BASOS ABS: 0 10*3/uL (ref 0.0–0.1)
BASOS PCT: 0.7 % (ref 0.0–3.0)
Eosinophils Absolute: 0.3 10*3/uL (ref 0.0–0.7)
Eosinophils Relative: 4.8 % (ref 0.0–5.0)
HCT: 45.1 % (ref 39.0–52.0)
Hemoglobin: 15.5 g/dL (ref 13.0–17.0)
Lymphocytes Relative: 21.6 % (ref 12.0–46.0)
Lymphs Abs: 1.2 10*3/uL (ref 0.7–4.0)
MCHC: 34.3 g/dL (ref 30.0–36.0)
MCV: 87.1 fl (ref 78.0–100.0)
Monocytes Absolute: 0.6 10*3/uL (ref 0.1–1.0)
Monocytes Relative: 11.3 % (ref 3.0–12.0)
NEUTROS ABS: 3.4 10*3/uL (ref 1.4–7.7)
NEUTROS PCT: 61.6 % (ref 43.0–77.0)
PLATELETS: 224 10*3/uL (ref 150.0–400.0)
RBC: 5.18 Mil/uL (ref 4.22–5.81)
RDW: 13.1 % (ref 11.5–15.5)
WBC: 5.6 10*3/uL (ref 4.0–10.5)

## 2013-10-13 LAB — PROTIME-INR
INR: 1 ratio (ref 0.8–1.0)
Prothrombin Time: 11.3 s (ref 9.6–13.1)

## 2013-10-14 ENCOUNTER — Encounter (HOSPITAL_COMMUNITY): Payer: Self-pay | Admitting: Pharmacy Technician

## 2013-10-19 ENCOUNTER — Ambulatory Visit (HOSPITAL_COMMUNITY)
Admission: RE | Admit: 2013-10-19 | Discharge: 2013-10-19 | Disposition: A | Discharge status: Home / Self Care | Payer: No Typology Code available for payment source | Source: Ambulatory Visit | Attending: Interventional Cardiology | Admitting: Interventional Cardiology

## 2013-10-19 ENCOUNTER — Encounter (HOSPITAL_COMMUNITY)
Admission: RE | Disposition: A | Discharge status: Home / Self Care | Payer: Self-pay | Source: Ambulatory Visit | Attending: Interventional Cardiology

## 2013-10-19 DIAGNOSIS — R079 Chest pain, unspecified: Secondary | ICD-10-CM

## 2013-10-19 DIAGNOSIS — R0789 Other chest pain: Secondary | ICD-10-CM | POA: Insufficient documentation

## 2013-10-19 DIAGNOSIS — M109 Gout, unspecified: Secondary | ICD-10-CM | POA: Insufficient documentation

## 2013-10-19 DIAGNOSIS — Z7982 Long term (current) use of aspirin: Secondary | ICD-10-CM | POA: Insufficient documentation

## 2013-10-19 HISTORY — PX: LEFT AND RIGHT HEART CATHETERIZATION WITH CORONARY ANGIOGRAM: SHX5449

## 2013-10-19 SURGERY — LEFT AND RIGHT HEART CATHETERIZATION WITH CORONARY ANGIOGRAM
Anesthesia: LOCAL

## 2013-10-19 MED ORDER — LIDOCAINE HCL (PF) 1 % IJ SOLN
INTRAMUSCULAR | Status: AC
  Filled 2013-10-19: qty 30

## 2013-10-19 MED ORDER — OXYCODONE-ACETAMINOPHEN 5-325 MG PO TABS: 1.0000 | ORAL_TABLET | ORAL | Status: DC | PRN

## 2013-10-19 MED ORDER — VERAPAMIL HCL 2.5 MG/ML IV SOLN
INTRAVENOUS | Status: AC
  Filled 2013-10-19: qty 2

## 2013-10-19 MED ORDER — HEPARIN (PORCINE) IN NACL 2-0.9 UNIT/ML-% IJ SOLN
INTRAMUSCULAR | Status: AC
  Filled 2013-10-19: qty 1500

## 2013-10-19 MED ORDER — FENTANYL CITRATE 0.05 MG/ML IJ SOLN
INTRAMUSCULAR | Status: AC
  Filled 2013-10-19: qty 2

## 2013-10-19 MED ORDER — NITROGLYCERIN 0.2 MG/ML ON CALL CATH LAB
INTRAVENOUS | Status: AC
  Filled 2013-10-19: qty 1

## 2013-10-19 MED ORDER — SODIUM CHLORIDE 0.9 % IV SOLN: 250.0000 mL | INTRAVENOUS | Status: DC | PRN

## 2013-10-19 MED ORDER — SODIUM CHLORIDE 0.9 % IJ SOLN: 3.0000 mL | Freq: Two times a day (BID) | INTRAMUSCULAR | Status: DC

## 2013-10-19 MED ORDER — ASPIRIN 81 MG PO CHEW
81.0000 mg | CHEWABLE_TABLET | ORAL | Status: AC
  Administered 2013-10-19: 81 mg via ORAL
  Filled 2013-10-19: qty 1

## 2013-10-19 MED ORDER — SODIUM CHLORIDE 0.9 % IJ SOLN
3.0000 mL | INTRAMUSCULAR | Status: DC | PRN
  Administered 2013-10-19: 3 mL via INTRAVENOUS

## 2013-10-19 MED ORDER — MIDAZOLAM HCL 2 MG/2ML IJ SOLN
INTRAMUSCULAR | Status: AC
  Filled 2013-10-19: qty 2

## 2013-10-19 MED ORDER — SODIUM CHLORIDE 0.9 % IV SOLN: 1.0000 mL/kg/h | INTRAVENOUS | Status: AC

## 2013-10-19 MED ORDER — HEPARIN SODIUM (PORCINE) 1000 UNIT/ML IJ SOLN
INTRAMUSCULAR | Status: AC
  Filled 2013-10-19: qty 1

## 2013-10-19 MED ORDER — SODIUM CHLORIDE 0.9 % IV SOLN
INTRAVENOUS | Status: DC
  Administered 2013-10-19: 08:00:00 via INTRAVENOUS

## 2013-10-19 NOTE — Discharge Instructions (Signed)

## 2013-10-19 NOTE — Interval H&P Note (Signed)
Cath Lab Visit (complete for each Cath Lab visit)  Clinical Evaluation Leading to the Procedure:   ACS: no  Non-ACS:    Anginal Classification: CCS II  Anti-ischemic medical therapy: Minimal Therapy (1 class of medications)  Non-Invasive Test Results: Low-risk stress test findings: cardiac mortality <1%/year  Prior CABG: No previous CABG      History and Physical Interval Note:  10/19/2013 10:06 AM  Leroy Campbell.  has presented today for surgery, with the diagnosis of Chest Pain  The various methods of treatment have been discussed with the patient and family. After consideration of risks, benefits and other options for treatment, the patient has consented to  Procedure(s): LEFT AND RIGHT HEART CATHETERIZATION WITH CORONARY ANGIOGRAM (N/A) as a surgical intervention .  The patient's history has been reviewed, patient examined, no change in status, stable for surgery.  I have reviewed the patient's chart and labs.  Questions were answered to the patient's satisfaction.     Sinclair Grooms

## 2013-10-19 NOTE — CV Procedure (Signed)
     Left Heart Catheterization with Coronary Angiography  Report  Leroy Campbell.  53 y.o.  male 12/04/1960  Procedure Date: 10/19/2013 Referring Physician: Darreld Mclean. Etter Sjogren, MD Primary Cardiologist:: [facility] Blenda Bridegroom, M.D.  INDICATIONS: Anginal quality chest pain, inferior Q waves on EKG, low risk myocardial perfusion study. Symptoms of exertional chest tightness. This study is needed to define anatomy and help guide therapy.  PROCEDURE: 1. Left heart catheterization; 2. Left ventriculography; 3. Coronary angiography  CONSENT:  The risks, benefits, and details of the procedure were explained in detail to the patient. Risks including death, stroke, heart attack, kidney injury, allergy, limb ischemia, bleeding and radiation injury were discussed.  The patient verbalized understanding and wanted to proceed.  Informed written consent was obtained.  PROCEDURE TECHNIQUE:  After Xylocaine anesthesia a a 5 French Slender sheath was placed in the right radial artery with an angiocath and the modified Seldinger technique.  Coronary angiography was done using a 5 F JL 3.5 and JR 4 catheter.  Left ventriculography was done using the JR 4 catheter and hand injection.   Digital images were reviewed and the case was terminated.   CONTRAST:  Total of 75 cc.  COMPLICATIONS:  None   HEMODYNAMICS:  Aortic pressure 116/73 mmHg; LV pressure 127/14 mmHg; LVEDP 23 mmHg  ANGIOGRAPHIC DATA:   The left main coronary artery is normal.  The left anterior descending artery is widely patent and transapical. The first diagonal and mid LAD contains minimal luminal irregularity.  The left circumflex artery is relatively small and normal. 3 small obtuse marginal branches arise proximally and a larger fourth obtuse marginal distally. All branches are clear of any significant obstruction.  The right coronary artery is dominant and normal.  LEFT VENTRICULOGRAM:  Left ventricular angiogram was done in the 30 RAO  projection and revealed normal size and contractility with an estimated ejection fraction of 60%.   IMPRESSIONS:  1. Widely patent coronary arteries with no evidence of obstructive disease.  2. Normal left ventricular function with ejection fraction of   RECOMMENDATION:  Risk factor modification given his young age to prevent future problems.  No limitations and no further cardiac evaluation necessary.Marland Kitchen

## 2013-10-19 NOTE — H&P (View-Only) (Signed)
Patient ID: Leroy Campbell., male   DOB: December 07, 1960, 53 y.o.   MRN: [id number]    [address], [address] [place], [place]  [address] Phone: [phone] Fax:  [phone]  Date:  09/30/2013   ID:  Leroy Campbell., DOB August 09, 1960, MRN [id number]  PCP:  Garnet Koyanagi, DO   ASSESSMENT:  1. Chest pressure, recurrent with recent history of low risk myocardial perfusion study but EKG demonstrating inferior Q waves compatible with infarction 2. Gout 3. History of gastric esophageal reflux  PLAN:  1. I have recommended that Jeneen Rinks undergo coronary angiography given ongoing complaints and no suitable explanation for the chest tightness with exertion. The appearance of the baseline EKG which shows right bundle with inferior Q waves and the "diaphragm attenuation" all myocardial perfusion imaging could indicate right coronary artery disease. I have recommended angiography to define anatomy and help guide therapy. The procedure including the risk of stroke, death, myocardial infarction, bleeding, kidney injury, limb ischemia, among others were discussed in detail with the patient and accepted.   SUBJECTIVE: Leroy Campbell. is a 53 y.o. male who over the past month has suffered with exertion related chest pressure. Myocardial perfusion imaging done recently demonstrated a moderate inferior wall defect in that was interpreted as diaphragm attenuation. Over the past 2 weeks the patient's symptoms have gradually improved. The patient's electrocardiogram done on the first visit with me demonstrated inferior Q waves in the setting of a right bundle branch block. The Q waves are new. He has some exertional fatigue. There is mild dyspnea on exertion.   Wt Readings from Last 3 Encounters:  09/30/13 215 lb (97.523 kg)  09/14/13 216 lb (97.977 kg)  05/27/13 222 lb 12 oz (101.039 kg)     Past Medical History  Diagnosis Date  . Gout     Current Outpatient Prescriptions  Medication Sig  Dispense Refill  . allopurinol (ZYLOPRIM) 100 MG tablet Take 2 tablets (200 mg total) by mouth daily.  180 tablet  1  . aspirin EC 81 MG tablet Take 1 tablet (81 mg total) by mouth daily.      . indomethacin (INDOCIN) 50 MG capsule TAKE 1 CAPSULE BY MOUTH TWICE DAILY WITH FOOD AS NEEDED  60 capsule  0   No current facility-administered medications for this visit.    Allergies:   No Known Allergies  Social History:  The patient  reports that he has never smoked. He has never used smokeless tobacco. He reports that he drinks alcohol. He reports that he does not use illicit drugs.   ROS:  Please see the history of present illness.   No palpitations or syncope. No claudication.   All other systems reviewed and negative.   OBJECTIVE: VS:  BP 122/68  Pulse 76  Ht 6\' 2"  (1.88 m)  Wt 215 lb (97.523 kg)  BMI 27.59 kg/m2 Well nourished, well developed, in no acute distress, younger and mildly obese HEENT: normal Neck: JVD flat. Carotid bruit absent  Cardiac:  normal S1, S2; RRR; no murmur Lungs:  clear to auscultation bilaterally, no wheezing, rhonchi or rales Abd: soft, nontender, no hepatomegaly Ext: Edema absent. Pulses 2+ and symmetric Skin: warm and dry Neuro:  CNs 2-12 intact, no focal abnormalities noted  EKG:  Unable to find EKG performed on the last office visit this needs to be repeated prior to cath       Signed, Illene Labrador III, MD 09/30/2013 5:07 PM

## 2013-10-22 ENCOUNTER — Telehealth: Payer: Self-pay

## 2013-10-22 NOTE — Telephone Encounter (Signed)
Message copied by Lamar Laundry on Fri Oct 22, 2013  9:46 AM ------      Message from: Daneen Schick      Created: Wed Oct 13, 2013  7:40 PM       okay ------

## 2014-01-10 ENCOUNTER — Other Ambulatory Visit: Payer: Self-pay | Admitting: Family Medicine

## 2014-04-14 ENCOUNTER — Encounter (HOSPITAL_COMMUNITY): Payer: Self-pay | Admitting: Interventional Cardiology

## 2014-04-18 ENCOUNTER — Other Ambulatory Visit: Payer: Self-pay | Admitting: Family Medicine

## 2014-05-04 ENCOUNTER — Other Ambulatory Visit: Payer: Self-pay | Admitting: Family Medicine

## 2014-05-05 ENCOUNTER — Other Ambulatory Visit: Payer: Self-pay | Admitting: Family Medicine

## 2014-05-05 NOTE — Telephone Encounter (Signed)
He needs and OV.     KP

## 2014-05-05 NOTE — Telephone Encounter (Signed)
Patient states that he is out and states that this medication is working great.

## 2014-05-09 NOTE — Telephone Encounter (Signed)
Appointment scheduled  For 05/17/14

## 2014-05-17 ENCOUNTER — Ambulatory Visit (INDEPENDENT_AMBULATORY_CARE_PROVIDER_SITE_OTHER): Payer: 59 | Admitting: Family Medicine

## 2014-05-17 ENCOUNTER — Ambulatory Visit (INDEPENDENT_AMBULATORY_CARE_PROVIDER_SITE_OTHER): Payer: Self-pay | Admitting: *Deleted

## 2014-05-17 ENCOUNTER — Encounter: Payer: Self-pay | Admitting: Family Medicine

## 2014-05-17 VITALS — BP 127/84 | HR 72 | Temp 97.8°F | Resp 16 | Wt 212.8 lb

## 2014-05-17 DIAGNOSIS — M10071 Idiopathic gout, right ankle and foot: Secondary | ICD-10-CM

## 2014-05-17 DIAGNOSIS — Z23 Encounter for immunization: Secondary | ICD-10-CM

## 2014-05-17 LAB — BASIC METABOLIC PANEL
BUN: 12 mg/dL (ref 6–23)
CO2: 26 mEq/L (ref 19–32)
Calcium: 9.6 mg/dL (ref 8.4–10.5)
Chloride: 105 mEq/L (ref 96–112)
Creatinine, Ser: 0.9 mg/dL (ref 0.4–1.5)
GFR: 91.84 mL/min (ref >60.00)
Glucose, Bld: 108 mg/dL — ABNORMAL HIGH (ref 70–99)
POTASSIUM: 4.6 meq/L (ref 3.5–5.1)
Sodium: 137 mEq/L (ref 135–145)

## 2014-05-17 LAB — URIC ACID: Uric Acid, Serum: 7 mg/dL (ref 4.0–7.8)

## 2014-05-17 MED ORDER — INDOMETHACIN 50 MG PO CAPS: 50.0000 mg | ORAL_CAPSULE | Freq: Two times a day (BID) | ORAL | Status: DC

## 2014-05-17 MED ORDER — ALLOPURINOL 100 MG PO TABS: 200.0000 mg | ORAL_TABLET | Freq: Every day | ORAL | Status: DC

## 2014-05-17 NOTE — Patient Instructions (Signed)

## 2014-05-17 NOTE — Progress Notes (Signed)
Pre visit review using our clinic review tool, if applicable. No additional management support is needed unless otherwise documented below in the visit note. 

## 2014-05-18 NOTE — Progress Notes (Signed)
   Subjective:    Patient ID: Leroy Campbell., male    DOB: Mar 27, 1961, 54 y.o.   MRN: [id number]  HPI Pt here to f/u gout and get refills. No new complaints.  He also would like a flu shot Review of Systems  Constitutional: Negative for fever, chills, diaphoresis, activity change, appetite change, fatigue and unexpected weight change.  Respiratory: Negative for cough, choking and chest tightness.   Cardiovascular: Negative for chest pain and palpitations.  Gastrointestinal: Negative for abdominal pain and abdominal distention.  Neurological: Negative.  Negative for facial asymmetry.       Objective:   Physical Exam BP 127/84 mmHg  Pulse 72  Temp(Src) 97.8 F (36.6 C) (Oral)  Resp 16  Wt 212 lb 12.8 oz (96.525 kg)  SpO2 94% General appearance: alert, cooperative, appears stated age and no distress Neck: no adenopathy, supple, symmetrical, trachea midline and thyroid not enlarged, symmetric, no tenderness/mass/nodules Lungs: clear to auscultation bilaterally Heart: S1, S2 normal Extremities: extremities normal, atraumatic, no cyanosis or edema       Assessment & Plan:  1. Acute idiopathic gout of right foot Check labs  - indomethacin (INDOCIN) 50 MG capsule; Take 1 capsule (50 mg total) by mouth 2 (two) times daily with a meal.  Dispense: 60 capsule; Refill: 2 - allopurinol (ZYLOPRIM) 100 MG tablet; Take 2 tablets (200 mg total) by mouth daily.  Dispense: 60 tablet; Refill: 11 - Basic metabolic panel - Uric acid

## 2014-05-19 ENCOUNTER — Telehealth: Payer: Self-pay | Admitting: *Deleted

## 2014-05-19 DIAGNOSIS — M10071 Idiopathic gout, right ankle and foot: Secondary | ICD-10-CM

## 2014-05-19 MED ORDER — ALLOPURINOL 300 MG PO TABS: 300.0000 mg | ORAL_TABLET | Freq: Every day | ORAL | Status: DC

## 2014-05-19 NOTE — Telephone Encounter (Signed)
-----   Message from Rosalita Chessman, DO sent at 05/18/2014  6:08 PM EST ----- Uric acid goal < 6.0---- increase allopruinol to 300 mg 1 po qd , #30  2 refills Recheck at cpe

## 2014-05-19 NOTE — Telephone Encounter (Signed)
Notified patient of lab results.  Patient Rx sent to [facility] on [address] per patient request.  Patient verbalized understanding and aware of medication change.  eal

## 2014-07-12 ENCOUNTER — Other Ambulatory Visit: Payer: Self-pay | Admitting: Family Medicine

## 2014-07-18 ENCOUNTER — Encounter: Payer: Self-pay | Admitting: *Deleted

## 2014-07-18 ENCOUNTER — Telehealth: Payer: Self-pay | Admitting: *Deleted

## 2014-07-18 NOTE — Telephone Encounter (Signed)
Pre-Visit Call completed with patient and chart updated.   Pre-Visit Info documented in Specialty Comments under [employer].    

## 2014-07-19 ENCOUNTER — Encounter: Payer: Self-pay | Admitting: Family Medicine

## 2014-07-19 ENCOUNTER — Ambulatory Visit (INDEPENDENT_AMBULATORY_CARE_PROVIDER_SITE_OTHER): Payer: 59 | Admitting: Family Medicine

## 2014-07-19 VITALS — BP 112/68 | HR 69 | Temp 98.0°F | Ht 74.0 in | Wt 217.6 lb

## 2014-07-19 DIAGNOSIS — M10071 Idiopathic gout, right ankle and foot: Secondary | ICD-10-CM

## 2014-07-19 DIAGNOSIS — Z0000 Encounter for general adult medical examination without abnormal findings: Secondary | ICD-10-CM

## 2014-07-19 LAB — CBC WITH DIFFERENTIAL/PLATELET
Basophils Absolute: 0 10*3/uL (ref 0.0–0.1)
Basophils Relative: 0.5 % (ref 0.0–3.0)
EOS ABS: 0.3 10*3/uL (ref 0.0–0.7)
EOS PCT: 4.7 % (ref 0.0–5.0)
HCT: 45.8 % (ref 39.0–52.0)
Hemoglobin: 15.8 g/dL (ref 13.0–17.0)
LYMPHS ABS: 1.5 10*3/uL (ref 0.7–4.0)
LYMPHS PCT: 25.7 % (ref 12.0–46.0)
MCHC: 34.4 g/dL (ref 30.0–36.0)
MCV: 86.2 fl (ref 78.0–100.0)
MONO ABS: 0.6 10*3/uL (ref 0.1–1.0)
Monocytes Relative: 9.9 % (ref 3.0–12.0)
Neutro Abs: 3.5 10*3/uL (ref 1.4–7.7)
Neutrophils Relative %: 59.2 % (ref 43.0–77.0)
Platelets: 183 10*3/uL (ref 150.0–400.0)
RBC: 5.31 Mil/uL (ref 4.22–5.81)
RDW: 13.3 % (ref 11.5–15.5)
WBC: 5.9 10*3/uL (ref 4.0–10.5)

## 2014-07-19 LAB — HEPATIC FUNCTION PANEL
ALT: 26 U/L (ref 0–53)
AST: 33 U/L (ref 0–37)
Albumin: 4.9 g/dL (ref 3.5–5.2)
Alkaline Phosphatase: 109 U/L (ref 39–117)
BILIRUBIN DIRECT: 0.1 mg/dL (ref 0.0–0.3)
BILIRUBIN TOTAL: 0.7 mg/dL (ref 0.2–1.2)
Total Protein: 7.2 g/dL (ref 6.0–8.3)

## 2014-07-19 LAB — POCT URINALYSIS DIPSTICK
Bilirubin, UA: NEGATIVE
Blood, UA: NEGATIVE
GLUCOSE UA: NEGATIVE
Ketones, UA: NEGATIVE
Leukocytes, UA: NEGATIVE
Nitrite, UA: NEGATIVE
Urobilinogen, UA: 0.2
pH, UA: 6

## 2014-07-19 LAB — BASIC METABOLIC PANEL
BUN: 14 mg/dL (ref 6–23)
CALCIUM: 9.8 mg/dL (ref 8.4–10.5)
CO2: 33 meq/L — AB (ref 19–32)
CREATININE: 0.97 mg/dL (ref 0.40–1.50)
Chloride: 104 mEq/L (ref 96–112)
GFR: 86.34 mL/min (ref >60.00)
GLUCOSE: 107 mg/dL — AB (ref 70–99)
Potassium: 4.5 mEq/L (ref 3.5–5.1)
Sodium: 140 mEq/L (ref 135–145)

## 2014-07-19 LAB — LIPID PANEL
CHOL/HDL RATIO: 5
Cholesterol: 222 mg/dL — ABNORMAL HIGH (ref 0–200)
HDL: 46.1 mg/dL (ref >39.00)
LDL Cholesterol: 152 mg/dL — ABNORMAL HIGH (ref 0–99)
NONHDL: 175.9
Triglycerides: 122 mg/dL (ref 0.0–149.0)
VLDL: 24.4 mg/dL (ref 0.0–40.0)

## 2014-07-19 LAB — URIC ACID: URIC ACID, SERUM: 6.3 mg/dL (ref 4.0–7.8)

## 2014-07-19 LAB — PSA: PSA: 1.04 ng/mL (ref 0.10–4.00)

## 2014-07-19 LAB — TSH: TSH: 2.07 u[IU]/mL (ref 0.35–4.50)

## 2014-07-19 NOTE — Progress Notes (Signed)
Pre visit review using our clinic review tool, if applicable. No additional management support is needed unless otherwise documented below in the visit note. 

## 2014-07-19 NOTE — Progress Notes (Signed)
Patient ID: Leroy Anger., male    DOB: Apr 02, 1961  Age: 54 y.o. MRN: [id number]    Subjective:  Subjective HPI Leroy Campbell. presents for cpe and labs  Review of Systems Review of Systems  Constitutional: Negative for activity change, appetite change and fatigue.  HENT: Negative for hearing loss, congestion, tinnitus and ear discharge.  dentist q2y Eyes: Negative for visual disturbance (see optho q1y -- vision corrected to 20/20 with glasses).  Respiratory: Negative for cough, chest tightness and shortness of breath.   Cardiovascular: Negative for chest pain, palpitations and leg swelling.  Gastrointestinal: Negative for abdominal pain, diarrhea, constipation and abdominal distention.  Genitourinary: Negative for urgency, frequency, decreased urine volume and difficulty urinating.  Musculoskeletal: Negative for back pain, arthralgias and gait problem.  Skin: Negative for color change, pallor and rash.  Neurological: Negative for dizziness, light-headedness, numbness and headaches.  Hematological: Negative for adenopathy. Does not bruise/bleed easily.  Psychiatric/Behavioral: Negative for suicidal ideas, confusion, sleep disturbance, self-injury, dysphoric mood, decreased concentration and agitation.      History Past Medical History  Diagnosis Date  . Gout     He has past surgical history that includes Tonsillectomy and adenoidectomy; Shoulder surgery (Right, 2011); Knee surgery (Bilateral); and left and right heart catheterization with coronary angiogram (N/A, 10/19/2013).   His family history includes Diabetes in his paternal grandmother; Heart disease in his maternal grandfather and mother.He reports that he has never smoked. He has never used smokeless tobacco. He reports that he drinks alcohol. He reports that he does not use illicit drugs.  Current Outpatient Prescriptions on File Prior to Visit  Medication Sig Dispense Refill  . allopurinol (ZYLOPRIM) 300 MG  tablet Take 1 tablet (300 mg total) by mouth daily. 30 tablet 2  . aspirin EC 81 MG tablet Take 1 tablet (81 mg total) by mouth daily.    . indomethacin (INDOCIN) 50 MG capsule TAKE 1 CAPSULE BY MOUTH TWICE DAILY WITH FOOD AS NEEDED 60 capsule 0   No current facility-administered medications on file prior to visit.      Current Outpatient Prescriptions  Medication Sig Dispense Refill  . allopurinol (ZYLOPRIM) 300 MG tablet Take 1 tablet (300 mg total) by mouth daily. 30 tablet 2  . aspirin EC 81 MG tablet Take 1 tablet (81 mg total) by mouth daily.    . indomethacin (INDOCIN) 50 MG capsule TAKE 1 CAPSULE BY MOUTH TWICE DAILY WITH FOOD AS NEEDED 60 capsule 0   No current facility-administered medications for this visit.    Objective:  Objective Physical Exam BP 112/68 mmHg  Pulse 69  Temp(Src) 98 F (36.7 C) (Oral)  Ht 6\' 2"  (1.88 m)  Wt 217 lb 9.6 oz (98.703 kg)  BMI 27.93 kg/m2  SpO2 95% Wt Readings from Last 3 Encounters:  07/19/14 217 lb 9.6 oz (98.703 kg)  05/17/14 212 lb 12.8 oz (96.525 kg)  10/19/13 209 lb (94.802 kg)   BP 112/68 mmHg  Pulse 69  Temp(Src) 98 F (36.7 C) (Oral)  Ht 6\' 2"  (1.88 m)  Wt 217 lb 9.6 oz (98.703 kg)  BMI 27.93 kg/m2  SpO2 95% General appearance: alert, cooperative, appears stated age and no distress Head: Normocephalic, without obvious abnormality, atraumatic Eyes: conjunctivae/corneas clear. PERRL, EOM's intact. Fundi benign. Ears: normal TM's and external ear canals both ears Nose: Nares normal. Septum midline. Mucosa normal. No drainage or sinus tenderness. Throat: lips, mucosa, and tongue normal; teeth and gums normal Neck: no adenopathy, no  carotid bruit, no JVD, supple, symmetrical, trachea midline and thyroid not enlarged, symmetric, no tenderness/mass/nodules Back: symmetric, no curvature. ROM normal. No CVA tenderness. Lungs: clear to auscultation bilaterally Chest wall: no tenderness Heart: regular rate and rhythm, S1, S2  normal, no murmur, click, rub or gallop Abdomen: soft, non-tender; bowel sounds normal; no masses,  no organomegaly Male genitalia: normal, penis: no lesions or discharge. testes: no masses or tenderness. no hernias Rectal: normal tone, normal prostate, no masses or tenderness-heme + brown stool Extremities: extremities normal, atraumatic, no cyanosis or edema Pulses: 2+ and symmetric Skin: Skin color, texture, turgor normal. No rashes or lesions Lymph nodes: Cervical, supraclavicular, and axillary nodes normal. Neurologic: Alert and oriented X 3, normal strength and tone. Normal symmetric reflexes. Normal coordination and gait Psych- no depression, no anxiety  Lab Results  Component Value Date   WBC 5.6 10/13/2013   HGB 15.5 10/13/2013   HCT 45.1 10/13/2013   PLT 224.0 10/13/2013   GLUCOSE 108* 05/17/2014   CHOL 202* 05/25/2013   TRIG 153.0* 05/25/2013   HDL 38.20* 05/25/2013   LDLDIRECT 142.0 05/25/2013   ALT 36 05/25/2013   AST 36 05/25/2013   NA 137 05/17/2014   K 4.6 05/17/2014   CL 105 05/17/2014   CREATININE 0.9 05/17/2014   BUN 12 05/17/2014   CO2 26 05/17/2014   TSH 1.91 02/18/2013   PSA 0.82 02/18/2013   INR 1.0 10/13/2013    Dg Chest 2 View  10/13/2013   CLINICAL DATA:  Preop  EXAM: CHEST  2 VIEW  COMPARISON:  09/29/2009  FINDINGS: Cardiomediastinal silhouette is stable. No acute infiltrate or pleural effusion. No pulmonary edema. Bony thorax is unremarkable.  IMPRESSION: No active cardiopulmonary disease.   Electronically Signed   By: Lahoma Crocker M.D.   On: 10/13/2013 15:04     Assessment & Plan:  Plan I am having Leroy Campbell maintain his aspirin EC, allopurinol, and indomethacin.  No orders of the defined types were placed in this encounter.    Problem List Items Addressed This Visit    None    Visit Diagnoses    Preventative health care    -  Primary    Relevant Orders    Basic metabolic panel    CBC with Differential/Platelet    Hepatic function  panel    Lipid panel    POCT urinalysis dipstick    TSH    PSA    Idiopathic gout of right ankle, unspecified chronicity        Relevant Orders    Uric acid       Follow-up: Return in about 1 year (around 07/19/2015), or if symptoms worsen or fail to improve.  Garnet Koyanagi, DO

## 2014-07-19 NOTE — Patient Instructions (Signed)
Preventive Care for Adults A healthy lifestyle and preventive care can promote health and wellness. Preventive health guidelines for men include the following key practices:  A routine yearly physical is a good way to check with your health care provider about your health and preventative screening. It is a chance to share any concerns and updates on your health and to receive a thorough exam.  Visit your dentist for a routine exam and preventative care every 6 months. Brush your teeth twice a day and floss once a day. Good oral hygiene prevents tooth decay and gum disease.  The frequency of eye exams is based on your age, health, family medical history, use of contact lenses, and other factors. Follow your health care provider's recommendations for frequency of eye exams.  Eat a healthy diet. Foods such as vegetables, fruits, whole grains, low-fat dairy products, and lean protein foods contain the nutrients you need without too many calories. Decrease your intake of foods high in solid fats, added sugars, and salt. Eat the right amount of calories for you.Get information about a proper diet from your health care provider, if necessary.  Regular physical exercise is one of the most important things you can do for your health. Most adults should get at least 150 minutes of moderate-intensity exercise (any activity that increases your heart rate and causes you to sweat) each week. In addition, most adults need muscle-strengthening exercises on 2 or more days a week.  Maintain a healthy weight. The body mass index (BMI) is a screening tool to identify possible weight problems. It provides an estimate of body fat based on height and weight. Your health care provider can find your BMI and can help you achieve or maintain a healthy weight.For adults 20 years and older:  A BMI below 18.5 is considered underweight.  A BMI of 18.5 to 24.9 is normal.  A BMI of 25 to 29.9 is considered overweight.  A BMI  of 30 and above is considered obese.  Maintain normal blood lipids and cholesterol levels by exercising and minimizing your intake of saturated fat. Eat a balanced diet with plenty of fruit and vegetables. Blood tests for lipids and cholesterol should begin at age 50 and be repeated every 5 years. If your lipid or cholesterol levels are high, you are over 50, or you are at high risk for heart disease, you may need your cholesterol levels checked more frequently.Ongoing high lipid and cholesterol levels should be treated with medicines if diet and exercise are not working.  If you smoke, find out from your health care provider how to quit. If you do not use tobacco, do not start.  Lung cancer screening is recommended for adults aged 73-80 years who are at high risk for developing lung cancer because of a history of smoking. A yearly low-dose CT scan of the lungs is recommended for people who have at least a 30-pack-year history of smoking and are a current smoker or have quit within the past 15 years. A pack year of smoking is smoking an average of 1 pack of cigarettes a day for 1 year (for example: 1 pack a day for 30 years or 2 packs a day for 15 years). Yearly screening should continue until the smoker has stopped smoking for at least 15 years. Yearly screening should be stopped for people who develop a health problem that would prevent them from having lung cancer treatment.  If you choose to drink alcohol, do not have more than  2 drinks per day. One drink is considered to be 12 ounces (355 mL) of beer, 5 ounces (148 mL) of wine, or 1.5 ounces (44 mL) of liquor.  Avoid use of street drugs. Do not share needles with anyone. Ask for help if you need support or instructions about stopping the use of drugs.  High blood pressure causes heart disease and increases the risk of stroke. Your blood pressure should be checked at least every 1-2 years. Ongoing high blood pressure should be treated with  medicines, if weight loss and exercise are not effective.  If you are 45-79 years old, ask your health care provider if you should take aspirin to prevent heart disease.  Diabetes screening involves taking a blood sample to check your fasting blood sugar level. This should be done once every 3 years, after age 45, if you are within normal weight and without risk factors for diabetes. Testing should be considered at a younger age or be carried out more frequently if you are overweight and have at least 1 risk factor for diabetes.  Colorectal cancer can be detected and often prevented. Most routine colorectal cancer screening begins at the age of 50 and continues through age 75. However, your health care provider may recommend screening at an earlier age if you have risk factors for colon cancer. On a yearly basis, your health care provider may provide home test kits to check for hidden blood in the stool. Use of a small camera at the end of a tube to directly examine the colon (sigmoidoscopy or colonoscopy) can detect the earliest forms of colorectal cancer. Talk to your health care provider about this at age 50, when routine screening begins. Direct exam of the colon should be repeated every 5-10 years through age 75, unless early forms of precancerous polyps or small growths are found.  People who are at an increased risk for hepatitis B should be screened for this virus. You are considered at high risk for hepatitis B if:  You were born in a country where hepatitis B occurs often. Talk with your health care provider about which countries are considered high risk.  Your parents were born in a high-risk country and you have not received a shot to protect against hepatitis B (hepatitis B vaccine).  You have HIV or AIDS.  You use needles to inject street drugs.  You live with, or have sex with, someone who has hepatitis B.  You are a man who has sex with other men (MSM).  You get hemodialysis  treatment.  You take certain medicines for conditions such as cancer, organ transplantation, and autoimmune conditions.  Hepatitis C blood testing is recommended for all people born from 1945 through 1965 and any individual with known risks for hepatitis C.  Practice safe sex. Use condoms and avoid high-risk sexual practices to reduce the spread of sexually transmitted infections (STIs). STIs include gonorrhea, chlamydia, syphilis, trichomonas, herpes, HPV, and human immunodeficiency virus (HIV). Herpes, HIV, and HPV are viral illnesses that have no cure. They can result in disability, cancer, and death.  If you are at risk of being infected with HIV, it is recommended that you take a prescription medicine daily to prevent HIV infection. This is called preexposure prophylaxis (PrEP). You are considered at risk if:  You are a man who has sex with other men (MSM) and have other risk factors.  You are a heterosexual man, are sexually active, and are at increased risk for HIV infection.    You take drugs by injection.  You are sexually active with a partner who has HIV.  Talk with your health care provider about whether you are at high risk of being infected with HIV. If you choose to begin PrEP, you should first be tested for HIV. You should then be tested every 3 months for as long as you are taking PrEP.  A one-time screening for abdominal aortic aneurysm (AAA) and surgical repair of large AAAs by ultrasound are recommended for men ages 32 to 67 years who are current or former smokers.  Healthy men should no longer receive prostate-specific antigen (PSA) blood tests as part of routine cancer screening. Talk with your health care provider about prostate cancer screening.  Testicular cancer screening is not recommended for adult males who have no symptoms. Screening includes self-exam, a health care provider exam, and other screening tests. Consult with your health care provider about any symptoms  you have or any concerns you have about testicular cancer.  Use sunscreen. Apply sunscreen liberally and repeatedly throughout the day. You should seek shade when your shadow is shorter than you. Protect yourself by wearing long sleeves, pants, a wide-brimmed hat, and sunglasses year round, whenever you are outdoors.  Once a month, do a whole-body skin exam, using a mirror to look at the skin on your back. Tell your health care provider about new moles, moles that have irregular borders, moles that are larger than a pencil eraser, or moles that have changed in shape or color.  Stay current with required vaccines (immunizations).  Influenza vaccine. All adults should be immunized every year.  Tetanus, diphtheria, and acellular pertussis (Td, Tdap) vaccine. An adult who has not previously received Tdap or who does not know his vaccine status should receive 1 dose of Tdap. This initial dose should be followed by tetanus and diphtheria toxoids (Td) booster doses every 10 years. Adults with an unknown or incomplete history of completing a 3-dose immunization series with Td-containing vaccines should begin or complete a primary immunization series including a Tdap dose. Adults should receive a Td booster every 10 years.  Varicella vaccine. An adult without evidence of immunity to varicella should receive 2 doses or a second dose if he has previously received 1 dose.  Human papillomavirus (HPV) vaccine. Males aged 68-21 years who have not received the vaccine previously should receive the 3-dose series. Males aged 22-26 years may be immunized. Immunization is recommended through the age of 6 years for any male who has sex with males and did not get any or all doses earlier. Immunization is recommended for any person with an immunocompromised condition through the age of 49 years if he did not get any or all doses earlier. During the 3-dose series, the second dose should be obtained 4-8 weeks after the first  dose. The third dose should be obtained 24 weeks after the first dose and 16 weeks after the second dose.  Zoster vaccine. One dose is recommended for adults aged 50 years or older unless certain conditions are present.  Measles, mumps, and rubella (MMR) vaccine. Adults born before [birth year] generally are considered immune to measles and mumps. Adults born in [birth year] or later should have 1 or more doses of MMR vaccine unless there is a contraindication to the vaccine or there is laboratory evidence of immunity to each of the three diseases. A routine second dose of MMR vaccine should be obtained at least 28 days after the first dose for students attending postsecondary  schools, health care workers, or international travelers. People who received inactivated measles vaccine or an unknown type of measles vaccine during 1963-1967 should receive 2 doses of MMR vaccine. People who received inactivated mumps vaccine or an unknown type of mumps vaccine before 1979 and are at high risk for mumps infection should consider immunization with 2 doses of MMR vaccine. Unvaccinated health care workers born before 1957 who lack laboratory evidence of measles, mumps, or rubella immunity or laboratory confirmation of disease should consider measles and mumps immunization with 2 doses of MMR vaccine or rubella immunization with 1 dose of MMR vaccine.  Pneumococcal 13-valent conjugate (PCV13) vaccine. When indicated, a person who is uncertain of his immunization history and has no record of immunization should receive the PCV13 vaccine. An adult aged 19 years or older who has certain medical conditions and has not been previously immunized should receive 1 dose of PCV13 vaccine. This PCV13 should be followed with a dose of pneumococcal polysaccharide (PPSV23) vaccine. The PPSV23 vaccine dose should be obtained at least 8 weeks after the dose of PCV13 vaccine. An adult aged 19 years or older who has certain medical conditions and  previously received 1 or more doses of PPSV23 vaccine should receive 1 dose of PCV13. The PCV13 vaccine dose should be obtained 1 or more years after the last PPSV23 vaccine dose.  Pneumococcal polysaccharide (PPSV23) vaccine. When PCV13 is also indicated, PCV13 should be obtained first. All adults aged 65 years and older should be immunized. An adult younger than age 65 years who has certain medical conditions should be immunized. Any person who resides in a nursing home or long-term care facility should be immunized. An adult smoker should be immunized. People with an immunocompromised condition and certain other conditions should receive both PCV13 and PPSV23 vaccines. People with human immunodeficiency virus (HIV) infection should be immunized as soon as possible after diagnosis. Immunization during chemotherapy or radiation therapy should be avoided. Routine use of PPSV23 vaccine is not recommended for American Indians, [place] Natives, or people younger than 65 years unless there are medical conditions that require PPSV23 vaccine. When indicated, people who have unknown immunization and have no record of immunization should receive PPSV23 vaccine. One-time revaccination 5 years after the first dose of PPSV23 is recommended for people aged 19-64 years who have chronic kidney failure, nephrotic syndrome, asplenia, or immunocompromised conditions. People who received 1-2 doses of PPSV23 before age 65 years should receive another dose of PPSV23 vaccine at age 65 years or later if at least 5 years have passed since the previous dose. Doses of PPSV23 are not needed for people immunized with PPSV23 at or after age 65 years.  Meningococcal vaccine. Adults with asplenia or persistent complement component deficiencies should receive 2 doses of quadrivalent meningococcal conjugate (MenACWY-D) vaccine. The doses should be obtained at least 2 months apart. Microbiologists working with certain meningococcal bacteria,  military recruits, people at risk during an outbreak, and people who travel to or live in countries with a high rate of meningitis should be immunized. A first-year college student up through age 21 years who is living in a residence hall should receive a dose if he did not receive a dose on or after his 16th birthday. Adults who have certain high-risk conditions should receive one or more doses of vaccine.  Hepatitis A vaccine. Adults who wish to be protected from this disease, have certain high-risk conditions, work with hepatitis A-infected animals, work in hepatitis A research labs, or   travel to or work in countries with a high rate of hepatitis A should be immunized. Adults who were previously unvaccinated and who anticipate close contact with an international adoptee during the first 60 days after arrival in the [place] from a country with a high rate of hepatitis A should be immunized.  Hepatitis B vaccine. Adults should be immunized if they wish to be protected from this disease, have certain high-risk conditions, may be exposed to blood or other infectious body fluids, are household contacts or sex partners of hepatitis B positive people, are clients or workers in certain care facilities, or travel to or work in countries with a high rate of hepatitis B.  Haemophilus influenzae type b (Hib) vaccine. A previously unvaccinated person with asplenia or sickle cell disease or having a scheduled splenectomy should receive 1 dose of Hib vaccine. Regardless of previous immunization, a recipient of a hematopoietic stem cell transplant should receive a 3-dose series 6-12 months after his successful transplant. Hib vaccine is not recommended for adults with HIV infection. Preventive Service / Frequency Ages 52 to 17  Blood pressure check.** / Every 1 to 2 years.  Lipid and cholesterol check.** / Every 5 years beginning at age 69.  Hepatitis C blood test.** / For any individual with known risks for  hepatitis C.  Skin self-exam. / Monthly.  Influenza vaccine. / Every year.  Tetanus, diphtheria, and acellular pertussis (Tdap, Td) vaccine.** / Consult your health care provider. 1 dose of Td every 10 years.  Varicella vaccine.** / Consult your health care provider.  HPV vaccine. / 3 doses over 6 months, if 72 or younger.  Measles, mumps, rubella (MMR) vaccine.** / You need at least 1 dose of MMR if you were born in 1957 or later. You may also need a second dose.  Pneumococcal 13-valent conjugate (PCV13) vaccine.** / Consult your health care provider.  Pneumococcal polysaccharide (PPSV23) vaccine.** / 1 to 2 doses if you smoke cigarettes or if you have certain conditions.  Meningococcal vaccine.** / 1 dose if you are age 35 to 60 years and a Market researcher living in a residence hall, or have one of several medical conditions. You may also need additional booster doses.  Hepatitis A vaccine.** / Consult your health care provider.  Hepatitis B vaccine.** / Consult your health care provider.  Haemophilus influenzae type b (Hib) vaccine.** / Consult your health care provider. Ages 35 to 8  Blood pressure check.** / Every 1 to 2 years.  Lipid and cholesterol check.** / Every 5 years beginning at age 57.  Lung cancer screening. / Every year if you are aged 44-80 years and have a 30-pack-year history of smoking and currently smoke or have quit within the past 15 years. Yearly screening is stopped once you have quit smoking for at least 15 years or develop a health problem that would prevent you from having lung cancer treatment.  Fecal occult blood test (FOBT) of stool. / Every year beginning at age 55 and continuing until age 73. You may not have to do this test if you get a colonoscopy every 10 years.  Flexible sigmoidoscopy** or colonoscopy.** / Every 5 years for a flexible sigmoidoscopy or every 10 years for a colonoscopy beginning at age 28 and continuing until age  1.  Hepatitis C blood test.** / For all people born from [birth year] through 1965 and any individual with known risks for hepatitis C.  Skin self-exam. / Monthly.  Influenza vaccine. / Every  year.  Tetanus, diphtheria, and acellular pertussis (Tdap/Td) vaccine.** / Consult your health care provider. 1 dose of Td every 10 years.  Varicella vaccine.** / Consult your health care provider.  Zoster vaccine.** / 1 dose for adults aged 53 years or older.  Measles, mumps, rubella (MMR) vaccine.** / You need at least 1 dose of MMR if you were born in 1957 or later. You may also need a second dose.  Pneumococcal 13-valent conjugate (PCV13) vaccine.** / Consult your health care provider.  Pneumococcal polysaccharide (PPSV23) vaccine.** / 1 to 2 doses if you smoke cigarettes or if you have certain conditions.  Meningococcal vaccine.** / Consult your health care provider.  Hepatitis A vaccine.** / Consult your health care provider.  Hepatitis B vaccine.** / Consult your health care provider.  Haemophilus influenzae type b (Hib) vaccine.** / Consult your health care provider. Ages 77 and over  Blood pressure check.** / Every 1 to 2 years.  Lipid and cholesterol check.**/ Every 5 years beginning at age 85.  Lung cancer screening. / Every year if you are aged 55-80 years and have a 30-pack-year history of smoking and currently smoke or have quit within the past 15 years. Yearly screening is stopped once you have quit smoking for at least 15 years or develop a health problem that would prevent you from having lung cancer treatment.  Fecal occult blood test (FOBT) of stool. / Every year beginning at age 33 and continuing until age 11. You may not have to do this test if you get a colonoscopy every 10 years.  Flexible sigmoidoscopy** or colonoscopy.** / Every 5 years for a flexible sigmoidoscopy or every 10 years for a colonoscopy beginning at age 28 and continuing until age 73.  Hepatitis C blood  test.** / For all people born from [birth year] through 1965 and any individual with known risks for hepatitis C.  Abdominal aortic aneurysm (AAA) screening.** / A one-time screening for ages 50 to 27 years who are current or former smokers.  Skin self-exam. / Monthly.  Influenza vaccine. / Every year.  Tetanus, diphtheria, and acellular pertussis (Tdap/Td) vaccine.** / 1 dose of Td every 10 years.  Varicella vaccine.** / Consult your health care provider.  Zoster vaccine.** / 1 dose for adults aged 34 years or older.  Pneumococcal 13-valent conjugate (PCV13) vaccine.** / Consult your health care provider.  Pneumococcal polysaccharide (PPSV23) vaccine.** / 1 dose for all adults aged 63 years and older.  Meningococcal vaccine.** / Consult your health care provider.  Hepatitis A vaccine.** / Consult your health care provider.  Hepatitis B vaccine.** / Consult your health care provider.  Haemophilus influenzae type b (Hib) vaccine.** / Consult your health care provider. **Family history and personal history of risk and conditions may change your health care provider's recommendations. Document Released: 06/18/2001 Document Revised: 04/27/2013 Document Reviewed: 09/17/2010 [facility] Patient Information 2015 [place], [place]. This information is not intended to replace advice given to you by your health care provider. Make sure you discuss any questions you have with your health care provider.

## 2014-08-21 ENCOUNTER — Other Ambulatory Visit: Payer: Self-pay | Admitting: Family Medicine

## 2014-11-04 ENCOUNTER — Other Ambulatory Visit: Payer: Self-pay | Admitting: Family Medicine

## 2014-11-04 NOTE — Telephone Encounter (Signed)
Relation to pt: self Call back number: [phone] Pharmacy: [id number] - [place], [address] [phone] (Phone) [phone] (Fax)         Reason for call:  Pt requesting a refill indomethacin (INDOCIN) 50 MG capsule. Pt is going out of town in the next hour. Advised pt MD in clinic.

## 2014-11-04 NOTE — Telephone Encounter (Signed)
Rx faxed.    KP 

## 2015-01-25 ENCOUNTER — Other Ambulatory Visit: Payer: Self-pay | Admitting: Family Medicine

## 2015-02-23 ENCOUNTER — Other Ambulatory Visit: Payer: Self-pay | Admitting: Family Medicine

## 2015-07-18 ENCOUNTER — Other Ambulatory Visit: Payer: Self-pay | Admitting: Family Medicine

## 2015-07-18 NOTE — Telephone Encounter (Signed)
Please schedule the patient a CPE with Dr.Lowne.      KP

## 2015-07-19 NOTE — Telephone Encounter (Signed)
Called patient and scheduled CPE for 7/14

## 2015-08-31 ENCOUNTER — Other Ambulatory Visit: Payer: Self-pay | Admitting: Family Medicine

## 2015-09-01 NOTE — Telephone Encounter (Signed)
Refills sent. Sent mychart message to pt.

## 2015-09-01 NOTE — Telephone Encounter (Signed)
Caller name: Self  Can be reached: [phone] Pharmacy:  [id number] - [place], [address] AT [facility] [address] [phone] (Phone) [phone] (Fax)       Reason for call: Refill allopurinol (ZYLOPRIM) 300 MG tablet [id number]

## 2015-09-26 ENCOUNTER — Other Ambulatory Visit: Payer: Self-pay | Admitting: Family Medicine

## 2015-11-17 ENCOUNTER — Encounter: Payer: Self-pay | Admitting: Family Medicine

## 2015-11-17 ENCOUNTER — Ambulatory Visit (INDEPENDENT_AMBULATORY_CARE_PROVIDER_SITE_OTHER): Payer: 59 | Admitting: Family Medicine

## 2015-11-17 VITALS — BP 137/85 | HR 61 | Temp 98.0°F | Ht 74.0 in | Wt 225.8 lb

## 2015-11-17 DIAGNOSIS — M109 Gout, unspecified: Secondary | ICD-10-CM

## 2015-11-17 DIAGNOSIS — K429 Umbilical hernia without obstruction or gangrene: Secondary | ICD-10-CM | POA: Diagnosis not present

## 2015-11-17 DIAGNOSIS — D1730 Benign lipomatous neoplasm of skin and subcutaneous tissue of unspecified sites: Secondary | ICD-10-CM

## 2015-11-17 DIAGNOSIS — Z23 Encounter for immunization: Secondary | ICD-10-CM

## 2015-11-17 DIAGNOSIS — Z0000 Encounter for general adult medical examination without abnormal findings: Secondary | ICD-10-CM

## 2015-11-17 DIAGNOSIS — M10079 Idiopathic gout, unspecified ankle and foot: Secondary | ICD-10-CM | POA: Diagnosis not present

## 2015-11-17 DIAGNOSIS — Z1159 Encounter for screening for other viral diseases: Secondary | ICD-10-CM | POA: Diagnosis not present

## 2015-11-17 LAB — CBC WITH DIFFERENTIAL/PLATELET
BASOS PCT: 0.6 % (ref 0.0–3.0)
Basophils Absolute: 0 10*3/uL (ref 0.0–0.1)
EOS PCT: 3.7 % (ref 0.0–5.0)
Eosinophils Absolute: 0.1 10*3/uL (ref 0.0–0.7)
HEMATOCRIT: 44.3 % (ref 39.0–52.0)
HEMOGLOBIN: 15.2 g/dL (ref 13.0–17.0)
LYMPHS PCT: 25.4 % (ref 12.0–46.0)
Lymphs Abs: 1 10*3/uL (ref 0.7–4.0)
MCHC: 34.2 g/dL (ref 30.0–36.0)
MCV: 85.7 fl (ref 78.0–100.0)
MONO ABS: 0.5 10*3/uL (ref 0.1–1.0)
MONOS PCT: 11.4 % (ref 3.0–12.0)
Neutro Abs: 2.4 10*3/uL (ref 1.4–7.7)
Neutrophils Relative %: 58.9 % (ref 43.0–77.0)
Platelets: 190 10*3/uL (ref 150.0–400.0)
RBC: 5.18 Mil/uL (ref 4.22–5.81)
RDW: 13.3 % (ref 11.5–15.5)
WBC: 4.1 10*3/uL (ref 4.0–10.5)

## 2015-11-17 LAB — LIPID PANEL
CHOL/HDL RATIO: 5
CHOLESTEROL: 193 mg/dL (ref 0–200)
HDL: 35.5 mg/dL — ABNORMAL LOW (ref >39.00)
LDL CALC: 134 mg/dL — AB (ref 0–99)
NonHDL: 157.35
Triglycerides: 118 mg/dL (ref 0.0–149.0)
VLDL: 23.6 mg/dL (ref 0.0–40.0)

## 2015-11-17 LAB — COMPREHENSIVE METABOLIC PANEL
ALBUMIN: 4.4 g/dL (ref 3.5–5.2)
ALK PHOS: 97 U/L (ref 39–117)
ALT: 17 U/L (ref 0–53)
AST: 24 U/L (ref 0–37)
BUN: 13 mg/dL (ref 6–23)
CALCIUM: 8.9 mg/dL (ref 8.4–10.5)
CHLORIDE: 106 meq/L (ref 96–112)
CO2: 29 mEq/L (ref 19–32)
Creatinine, Ser: 0.95 mg/dL (ref 0.40–1.50)
GFR: 87.99 mL/min (ref >60.00)
Glucose, Bld: 116 mg/dL — ABNORMAL HIGH (ref 70–99)
POTASSIUM: 4.4 meq/L (ref 3.5–5.1)
SODIUM: 140 meq/L (ref 135–145)
TOTAL PROTEIN: 6.6 g/dL (ref 6.0–8.3)
Total Bilirubin: 0.7 mg/dL (ref 0.2–1.2)

## 2015-11-17 LAB — POCT URINALYSIS DIPSTICK
GLUCOSE UA: NEGATIVE
KETONES UA: NEGATIVE
Leukocytes, UA: NEGATIVE
Nitrite, UA: NEGATIVE
PROTEIN UA: NEGATIVE
RBC UA: NEGATIVE
Urobilinogen, UA: 0.2
pH, UA: 6

## 2015-11-17 LAB — PSA: PSA: 0.73 ng/mL (ref 0.10–4.00)

## 2015-11-17 LAB — URIC ACID: Uric Acid, Serum: 6.2 mg/dL (ref 4.0–7.8)

## 2015-11-17 LAB — TSH: TSH: 1.92 u[IU]/mL (ref 0.35–4.50)

## 2015-11-17 MED ORDER — INDOMETHACIN 50 MG PO CAPS: ORAL_CAPSULE | ORAL | Status: DC

## 2015-11-17 MED ORDER — ALLOPURINOL 300 MG PO TABS: 300.0000 mg | ORAL_TABLET | Freq: Every day | ORAL | Status: DC

## 2015-11-17 NOTE — Assessment & Plan Note (Signed)
Check labs ghm utd ghm utd See avs

## 2015-11-17 NOTE — Progress Notes (Signed)
Pre visit review using our clinic review tool, if applicable. No additional management support is needed unless otherwise documented below in the visit note. 

## 2015-11-17 NOTE — Progress Notes (Signed)
Patient ID: Leroy Anger., male    DOB: 1960/07/07  Age: 55 y.o. MRN: [id number]    Subjective:  Subjective HPI Leroy Campbell. presents for cpe and labs.  Pt c/o lipomas increasing in size on side and abd and causing pain.  No other complaints.    Review of Systems  Constitutional: Negative.   HENT: Negative for congestion, ear pain, hearing loss, nosebleeds, postnasal drip, rhinorrhea, sinus pressure, sneezing and tinnitus.   Eyes: Negative for photophobia, discharge, itching and visual disturbance.  Respiratory: Negative.   Cardiovascular: Negative.   Gastrointestinal: Negative for abdominal pain, constipation, blood in stool, abdominal distention and anal bleeding.  Endocrine: Negative.   Genitourinary: Negative.   Musculoskeletal: Negative.   Skin: Negative.   Allergic/Immunologic: Negative.   Neurological: Negative for dizziness, weakness, light-headedness, numbness and headaches.  Psychiatric/Behavioral: Negative for suicidal ideas, confusion, sleep disturbance, dysphoric mood, decreased concentration and agitation. The patient is not nervous/anxious.     History Past Medical History  Diagnosis Date  . Gout     He has past surgical history that includes Tonsillectomy and adenoidectomy; Shoulder surgery (Right, 2011); Knee surgery (Bilateral); and left and right heart catheterization with coronary angiogram (N/A, 10/19/2013).   His family history includes Diabetes in his paternal grandmother; Heart disease in his maternal grandfather and mother.He reports that he has never smoked. He has never used smokeless tobacco. He reports that he drinks alcohol. He reports that he does not use illicit drugs.  No current outpatient prescriptions on file prior to visit.   No current facility-administered medications on file prior to visit.     Objective:  Objective Physical Exam  Constitutional: He is oriented to person, place, and time. He appears well-developed and  well-nourished. No distress.  HENT:  Head: Normocephalic and atraumatic.  Right Ear: External ear normal.  Left Ear: External ear normal.  Nose: Nose normal.  Mouth/Throat: Oropharynx is clear and moist. No oropharyngeal exudate.  Eyes: Conjunctivae and EOM are normal. Pupils are equal, round, and reactive to light. Right eye exhibits no discharge. Left eye exhibits no discharge.  Neck: Normal range of motion. Neck supple. No JVD present. No thyromegaly present.  Cardiovascular: Normal rate, regular rhythm and intact distal pulses.  Exam reveals no gallop and no friction rub.   No murmur heard. Pulmonary/Chest: Effort normal and breath sounds normal. No respiratory distress. He has no wheezes. He has no rales. He exhibits no tenderness.  Abdominal: Soft. Bowel sounds are normal. He exhibits no distension and no mass. There is no tenderness. There is no rebound and no guarding. A hernia is present.    Genitourinary: Rectum normal, prostate normal and penis normal. Guaiac negative stool.  Musculoskeletal: Normal range of motion. He exhibits no edema or tenderness.  Lymphadenopathy:    He has no cervical adenopathy.  Neurological: He is alert and oriented to person, place, and time. He displays normal reflexes. He exhibits normal muscle tone.  Skin: Skin is warm and dry. No rash noted. He is not diaphoretic. No erythema. No pallor.  Psychiatric: He has a normal mood and affect. His behavior is normal. Judgment and thought content normal.  Nursing note and vitals reviewed.  BP 137/85 mmHg  Pulse 61  Temp(Src) 98 F (36.7 C) (Oral)  Ht 6\' 2"  (1.88 m)  Wt 225 lb 12.8 oz (102.422 kg)  BMI 28.98 kg/m2  SpO2 98% Wt Readings from Last 3 Encounters:  11/17/15 225 lb 12.8 oz (102.422 kg)  07/19/14 217 lb 9.6 oz (98.703 kg)  05/17/14 212 lb 12.8 oz (96.525 kg)     Lab Results  Component Value Date   WBC 5.9 07/19/2014   HGB 15.8 07/19/2014   HCT 45.8 07/19/2014   PLT 183.0 07/19/2014     GLUCOSE 107* 07/19/2014   CHOL 222* 07/19/2014   TRIG 122.0 07/19/2014   HDL 46.10 07/19/2014   LDLDIRECT 142.0 05/25/2013   LDLCALC 152* 07/19/2014   ALT 26 07/19/2014   AST 33 07/19/2014   NA 140 07/19/2014   K 4.5 07/19/2014   CL 104 07/19/2014   CREATININE 0.97 07/19/2014   BUN 14 07/19/2014   CO2 33* 07/19/2014   TSH 2.07 07/19/2014   PSA 1.04 07/19/2014   INR 1.0 10/13/2013    Dg Chest 2 View  10/13/2013  CLINICAL DATA:  Preop EXAM: CHEST  2 VIEW COMPARISON:  09/29/2009 FINDINGS: Cardiomediastinal silhouette is stable. No acute infiltrate or pleural effusion. No pulmonary edema. Bony thorax is unremarkable. IMPRESSION: No active cardiopulmonary disease. Electronically Signed   By: Lahoma Crocker M.D.   On: 10/13/2013 15:04     Assessment & Plan:  Plan I have discontinued Mr. Bobick aspirin EC. I have also changed his indomethacin and allopurinol.  Meds ordered this encounter  Medications  . indomethacin (INDOCIN) 50 MG capsule    Sig: TAKE 1 CAPSULE(50 MG) BY MOUTH TWICE DAILY WITH A MEAL.    Dispense:  60 capsule    Refill:  5  . allopurinol (ZYLOPRIM) 300 MG tablet    Sig: Take 1 tablet (300 mg total) by mouth daily.    Dispense:  30 tablet    Refill:  5    Problem List Items Addressed This Visit      Unprioritized   Gout   Relevant Medications   indomethacin (INDOCIN) 50 MG capsule   allopurinol (ZYLOPRIM) 300 MG tablet   Other Relevant Orders   Uric acid   Preventative health care - Primary    Check labs ghm utd ghm utd See avs      Relevant Orders   Ambulatory referral to Gastroenterology   Comprehensive metabolic panel   CBC with Differential/Platelet   Lipid panel   POCT urinalysis dipstick (Completed)   TSH   PSA    Other Visit Diagnoses    Need for hepatitis C screening test        Relevant Orders    Hepatitis C antibody    Umbilical hernia without obstruction and without gangrene        Relevant Orders    Ambulatory referral to  General Surgery    Lipoma of skin        Relevant Orders    Ambulatory referral to General Surgery    Need for Tdap vaccination        Relevant Orders    Tdap vaccine greater than or equal to 7yo IM (Completed)       Follow-up: Return in about 1 year (around 11/16/2016), or if symptoms worsen or fail to improve, for annual exam, fasting.  Ann Held, DO

## 2015-11-17 NOTE — Patient Instructions (Signed)

## 2015-11-18 LAB — HEPATITIS C ANTIBODY: HCV Ab: NEGATIVE

## 2015-12-18 ENCOUNTER — Encounter: Payer: Self-pay | Admitting: Family Medicine

## 2016-06-28 ENCOUNTER — Other Ambulatory Visit: Payer: Self-pay | Admitting: Family Medicine

## 2016-06-28 DIAGNOSIS — M109 Gout, unspecified: Secondary | ICD-10-CM

## 2016-08-16 ENCOUNTER — Other Ambulatory Visit: Payer: Self-pay | Admitting: Family Medicine

## 2016-08-16 DIAGNOSIS — M109 Gout, unspecified: Secondary | ICD-10-CM

## 2016-11-04 ENCOUNTER — Other Ambulatory Visit: Payer: Self-pay | Admitting: Family Medicine

## 2016-11-04 DIAGNOSIS — M109 Gout, unspecified: Secondary | ICD-10-CM

## 2016-11-05 ENCOUNTER — Other Ambulatory Visit: Payer: Self-pay | Admitting: *Deleted

## 2016-11-18 ENCOUNTER — Encounter: Payer: 59 | Admitting: Family Medicine

## 2016-11-18 DIAGNOSIS — Z0289 Encounter for other administrative examinations: Secondary | ICD-10-CM

## 2016-11-19 ENCOUNTER — Other Ambulatory Visit: Payer: Self-pay | Admitting: Family Medicine

## 2016-11-19 DIAGNOSIS — M109 Gout, unspecified: Secondary | ICD-10-CM

## 2017-03-25 ENCOUNTER — Other Ambulatory Visit: Payer: Self-pay | Admitting: Family Medicine

## 2017-03-25 DIAGNOSIS — M109 Gout, unspecified: Secondary | ICD-10-CM

## 2017-04-24 ENCOUNTER — Other Ambulatory Visit: Payer: Self-pay | Admitting: *Deleted

## 2017-04-24 DIAGNOSIS — M109 Gout, unspecified: Secondary | ICD-10-CM

## 2017-04-24 MED ORDER — ALLOPURINOL 300 MG PO TABS: ORAL_TABLET | ORAL | 0 refills | Status: DC

## 2017-05-27 ENCOUNTER — Other Ambulatory Visit: Payer: Self-pay | Admitting: Family Medicine

## 2017-05-27 DIAGNOSIS — M109 Gout, unspecified: Secondary | ICD-10-CM

## 2017-07-07 ENCOUNTER — Other Ambulatory Visit: Payer: Self-pay | Admitting: Family Medicine

## 2017-07-07 DIAGNOSIS — M109 Gout, unspecified: Secondary | ICD-10-CM

## 2017-08-04 ENCOUNTER — Other Ambulatory Visit: Payer: Self-pay | Admitting: Family Medicine

## 2017-08-04 DIAGNOSIS — M109 Gout, unspecified: Secondary | ICD-10-CM

## 2017-09-26 ENCOUNTER — Other Ambulatory Visit: Payer: Self-pay | Admitting: Family Medicine

## 2017-09-26 DIAGNOSIS — M109 Gout, unspecified: Secondary | ICD-10-CM

## 2021-04-27 ENCOUNTER — Emergency Department (HOSPITAL_BASED_OUTPATIENT_CLINIC_OR_DEPARTMENT_OTHER): Payer: No Typology Code available for payment source

## 2021-04-27 ENCOUNTER — Emergency Department (HOSPITAL_BASED_OUTPATIENT_CLINIC_OR_DEPARTMENT_OTHER): Payer: No Typology Code available for payment source | Admitting: Radiology

## 2021-04-27 ENCOUNTER — Other Ambulatory Visit: Payer: Self-pay

## 2021-04-27 ENCOUNTER — Inpatient Hospital Stay (HOSPITAL_COMMUNITY)
Admission: EM | Disposition: A | Discharge status: Home / Self Care | Payer: Self-pay | Source: Home / Self Care | Attending: Cardiology

## 2021-04-27 ENCOUNTER — Encounter (HOSPITAL_BASED_OUTPATIENT_CLINIC_OR_DEPARTMENT_OTHER): Payer: Self-pay

## 2021-04-27 ENCOUNTER — Inpatient Hospital Stay (HOSPITAL_BASED_OUTPATIENT_CLINIC_OR_DEPARTMENT_OTHER)
Admission: EM | Admit: 2021-04-27 | Discharge: 2021-04-29 | DRG: 247 | Disposition: A | Discharge status: Home / Self Care | Payer: No Typology Code available for payment source | Attending: Cardiology | Admitting: Cardiology

## 2021-04-27 DIAGNOSIS — Z79899 Other long term (current) drug therapy: Secondary | ICD-10-CM

## 2021-04-27 DIAGNOSIS — Z91030 Bee allergy status: Secondary | ICD-10-CM

## 2021-04-27 DIAGNOSIS — E785 Hyperlipidemia, unspecified: Secondary | ICD-10-CM | POA: Diagnosis present

## 2021-04-27 DIAGNOSIS — I255 Ischemic cardiomyopathy: Secondary | ICD-10-CM | POA: Diagnosis present

## 2021-04-27 DIAGNOSIS — Z955 Presence of coronary angioplasty implant and graft: Secondary | ICD-10-CM | POA: Diagnosis not present

## 2021-04-27 DIAGNOSIS — I2510 Atherosclerotic heart disease of native coronary artery without angina pectoris: Secondary | ICD-10-CM | POA: Diagnosis present

## 2021-04-27 DIAGNOSIS — I4510 Unspecified right bundle-branch block: Secondary | ICD-10-CM | POA: Diagnosis present

## 2021-04-27 DIAGNOSIS — K219 Gastro-esophageal reflux disease without esophagitis: Secondary | ICD-10-CM | POA: Diagnosis present

## 2021-04-27 DIAGNOSIS — R739 Hyperglycemia, unspecified: Secondary | ICD-10-CM | POA: Diagnosis present

## 2021-04-27 DIAGNOSIS — Z8249 Family history of ischemic heart disease and other diseases of the circulatory system: Secondary | ICD-10-CM | POA: Diagnosis not present

## 2021-04-27 DIAGNOSIS — I214 Non-ST elevation (NSTEMI) myocardial infarction: Principal | ICD-10-CM | POA: Diagnosis present

## 2021-04-27 DIAGNOSIS — Z20822 Contact with and (suspected) exposure to covid-19: Secondary | ICD-10-CM | POA: Diagnosis present

## 2021-04-27 DIAGNOSIS — M109 Gout, unspecified: Secondary | ICD-10-CM | POA: Diagnosis present

## 2021-04-27 DIAGNOSIS — I2111 ST elevation (STEMI) myocardial infarction involving right coronary artery: Secondary | ICD-10-CM | POA: Diagnosis not present

## 2021-04-27 DIAGNOSIS — I219 Acute myocardial infarction, unspecified: Secondary | ICD-10-CM

## 2021-04-27 HISTORY — PX: CORONARY STENT INTERVENTION: CATH118234

## 2021-04-27 HISTORY — PX: LEFT HEART CATH AND CORONARY ANGIOGRAPHY: CATH118249

## 2021-04-27 HISTORY — DX: Ischemic cardiomyopathy: I25.5

## 2021-04-27 HISTORY — DX: Acute myocardial infarction, unspecified: I21.9

## 2021-04-27 HISTORY — DX: Hyperlipidemia, unspecified: E78.5

## 2021-04-27 LAB — CBC
HCT: 47.5 % (ref 39.0–52.0)
HCT: 48 % (ref 39.0–52.0)
Hemoglobin: 16.3 g/dL (ref 13.0–17.0)
Hemoglobin: 16.3 g/dL (ref 13.0–17.0)
MCH: 29.4 pg (ref 26.0–34.0)
MCH: 29.9 pg (ref 26.0–34.0)
MCHC: 34.3 g/dL (ref 30.0–36.0)
MCHC: 34 g/dL (ref 30.0–36.0)
MCV: 85.7 fL (ref 80.0–100.0)
MCV: 87.9 fL (ref 80.0–100.0)
Platelets: 170 10*3/uL (ref 150–400)
Platelets: 157 10*3/uL (ref 150–400)
RBC: 5.54 MIL/uL (ref 4.22–5.81)
RBC: 5.46 MIL/uL (ref 4.22–5.81)
RDW: 13 % (ref 11.5–15.5)
RDW: 13.1 % (ref 11.5–15.5)
WBC: 6.5 10*3/uL (ref 4.0–10.5)
WBC: 7 10*3/uL (ref 4.0–10.5)
nRBC: 0 % (ref 0.0–0.2)
nRBC: 0 % (ref 0.0–0.2)

## 2021-04-27 LAB — BRAIN NATRIURETIC PEPTIDE: B Natriuretic Peptide: 52.5 pg/mL (ref 0.0–100.0)

## 2021-04-27 LAB — MRSA NEXT GEN BY PCR, NASAL: MRSA by PCR Next Gen: NOT DETECTED

## 2021-04-27 LAB — BASIC METABOLIC PANEL
Anion gap: 10 (ref 5–15)
BUN: 13 mg/dL (ref 6–20)
CO2: 25 mmol/L (ref 22–32)
Calcium: 9.6 mg/dL (ref 8.9–10.3)
Chloride: 103 mmol/L (ref 98–111)
Creatinine, Ser: 0.95 mg/dL (ref 0.61–1.24)
GFR, Estimated: >60 mL/min (ref >60)
Glucose, Bld: 116 mg/dL — ABNORMAL HIGH (ref 70–99)
Potassium: 4.2 mmol/L (ref 3.5–5.1)
Sodium: 138 mmol/L (ref 135–145)

## 2021-04-27 LAB — HEMOGLOBIN A1C
Hgb A1c MFr Bld: 5.4 % (ref 4.8–5.6)
Mean Plasma Glucose: 108.28 mg/dL

## 2021-04-27 LAB — HIV ANTIBODY (ROUTINE TESTING W REFLEX): HIV Screen 4th Generation wRfx: NONREACTIVE

## 2021-04-27 LAB — POCT ACTIVATED CLOTTING TIME: Activated Clotting Time: 426 seconds

## 2021-04-27 LAB — RESP PANEL BY RT-PCR (FLU A&B, COVID) ARPGX2
Influenza A by PCR: NEGATIVE
Influenza B by PCR: NEGATIVE
SARS Coronavirus 2 by RT PCR: NEGATIVE

## 2021-04-27 LAB — TROPONIN I (HIGH SENSITIVITY)
Troponin I (High Sensitivity): 2649 ng/L (ref <18)
Troponin I (High Sensitivity): 4571 ng/L (ref <18)
Troponin I (High Sensitivity): 7280 ng/L (ref <18)
Troponin I (High Sensitivity): 8436 ng/L (ref <18)

## 2021-04-27 LAB — CREATININE, SERUM
Creatinine, Ser: 1.07 mg/dL (ref 0.61–1.24)
GFR, Estimated: >60 mL/min (ref >60)

## 2021-04-27 LAB — GLUCOSE, CAPILLARY: Glucose-Capillary: 126 mg/dL — ABNORMAL HIGH (ref 70–99)

## 2021-04-27 SURGERY — LEFT HEART CATH AND CORONARY ANGIOGRAPHY
Anesthesia: LOCAL

## 2021-04-27 MED ORDER — HEPARIN (PORCINE) 25000 UT/250ML-% IV SOLN
1300.0000 [IU]/h | INTRAVENOUS | Status: DC
  Administered 2021-04-27: 15:00:00 1300 [IU]/h via INTRAVENOUS
  Filled 2021-04-27: qty 250

## 2021-04-27 MED ORDER — HEPARIN (PORCINE) IN NACL 1000-0.9 UT/500ML-% IV SOLN
INTRAVENOUS | Status: DC | PRN
  Administered 2021-04-27 (×2): 500 mL

## 2021-04-27 MED ORDER — TICAGRELOR 90 MG PO TABS
ORAL_TABLET | ORAL | Status: AC
  Filled 2021-04-27: qty 1

## 2021-04-27 MED ORDER — METOPROLOL SUCCINATE ER 25 MG PO TB24
25.0000 mg | ORAL_TABLET | Freq: Every day | ORAL | Status: DC
  Administered 2021-04-27 – 2021-04-29 (×3): 25 mg via ORAL
  Filled 2021-04-27 (×3): qty 1

## 2021-04-27 MED ORDER — ATORVASTATIN CALCIUM 80 MG PO TABS
80.0000 mg | ORAL_TABLET | Freq: Every day | ORAL | Status: DC
  Administered 2021-04-27 – 2021-04-29 (×3): 80 mg via ORAL
  Filled 2021-04-27 (×3): qty 1

## 2021-04-27 MED ORDER — NITROGLYCERIN 0.4 MG SL SUBL
0.4000 mg | SUBLINGUAL_TABLET | SUBLINGUAL | Status: DC | PRN
  Administered 2021-04-27 (×3): 0.4 mg via SUBLINGUAL
  Filled 2021-04-27 (×2): qty 1

## 2021-04-27 MED ORDER — SODIUM CHLORIDE 0.9% FLUSH: 3.0000 mL | INTRAVENOUS | Status: DC | PRN

## 2021-04-27 MED ORDER — SODIUM CHLORIDE 0.9 % IV SOLN
INTRAVENOUS | Status: AC | PRN
  Administered 2021-04-27: 10 mL/h via INTRAVENOUS

## 2021-04-27 MED ORDER — HYDRALAZINE HCL 20 MG/ML IJ SOLN: 10.0000 mg | INTRAMUSCULAR | Status: AC | PRN

## 2021-04-27 MED ORDER — LIDOCAINE HCL (PF) 1 % IJ SOLN
INTRAMUSCULAR | Status: AC
  Filled 2021-04-27: qty 30

## 2021-04-27 MED ORDER — ACETAMINOPHEN 325 MG PO TABS: 650.0000 mg | ORAL_TABLET | ORAL | Status: DC | PRN

## 2021-04-27 MED ORDER — NITROGLYCERIN IN D5W 200-5 MCG/ML-% IV SOLN
0.0000 ug/min | INTRAVENOUS | Status: DC
Start: 2021-04-27 — End: 2021-04-27
  Administered 2021-04-27: 15:00:00 5 ug/min via INTRAVENOUS
  Filled 2021-04-27: qty 250

## 2021-04-27 MED ORDER — HEPARIN (PORCINE) 25000 UT/250ML-% IV SOLN: 12.0000 [IU]/kg/h | INTRAVENOUS | Status: DC

## 2021-04-27 MED ORDER — IOHEXOL 350 MG/ML SOLN
INTRAVENOUS | Status: DC | PRN
  Administered 2021-04-27: 18:00:00 100 mL

## 2021-04-27 MED ORDER — MIDAZOLAM HCL 2 MG/2ML IJ SOLN
INTRAMUSCULAR | Status: AC
  Filled 2021-04-27: qty 2

## 2021-04-27 MED ORDER — NITROGLYCERIN 1 MG/10 ML FOR IR/CATH LAB
INTRA_ARTERIAL | Status: AC
  Filled 2021-04-27: qty 10

## 2021-04-27 MED ORDER — CHLORHEXIDINE GLUCONATE CLOTH 2 % EX PADS
6.0000 | MEDICATED_PAD | Freq: Every day | CUTANEOUS | Status: DC
  Administered 2021-04-27: 19:00:00 6 via TOPICAL

## 2021-04-27 MED ORDER — NITROGLYCERIN 0.4 MG SL SUBL: 0.4000 mg | SUBLINGUAL_TABLET | SUBLINGUAL | Status: DC | PRN

## 2021-04-27 MED ORDER — HEPARIN SODIUM (PORCINE) 5000 UNIT/ML IJ SOLN
4000.0000 [IU] | Freq: Once | INTRAMUSCULAR | Status: AC
  Administered 2021-04-27: 14:00:00 4000 [IU] via INTRAVENOUS
  Filled 2021-04-27: qty 1

## 2021-04-27 MED ORDER — TICAGRELOR 90 MG PO TABS
90.0000 mg | ORAL_TABLET | Freq: Two times a day (BID) | ORAL | Status: DC
  Administered 2021-04-28 – 2021-04-29 (×3): 90 mg via ORAL
  Filled 2021-04-27 (×3): qty 1

## 2021-04-27 MED ORDER — SODIUM CHLORIDE 0.9 % WEIGHT BASED INFUSION
1.0000 mL/kg/h | INTRAVENOUS | Status: AC
  Administered 2021-04-27: 21:00:00 1 mL/kg/h via INTRAVENOUS

## 2021-04-27 MED ORDER — HEPARIN SODIUM (PORCINE) 1000 UNIT/ML IJ SOLN
INTRAMUSCULAR | Status: DC | PRN
  Administered 2021-04-27: 5000 [IU] via INTRAVENOUS
  Administered 2021-04-27: 6000 [IU] via INTRAVENOUS

## 2021-04-27 MED ORDER — VERAPAMIL HCL 2.5 MG/ML IV SOLN
INTRAVENOUS | Status: AC
  Filled 2021-04-27: qty 2

## 2021-04-27 MED ORDER — SODIUM CHLORIDE 0.9 % IV SOLN: 250.0000 mL | INTRAVENOUS | Status: DC | PRN

## 2021-04-27 MED ORDER — LIDOCAINE HCL (PF) 1 % IJ SOLN
INTRAMUSCULAR | Status: DC | PRN
  Administered 2021-04-27: 2 mL

## 2021-04-27 MED ORDER — ASPIRIN 300 MG RE SUPP: 300.0000 mg | RECTAL | Status: AC

## 2021-04-27 MED ORDER — MIDAZOLAM HCL 2 MG/2ML IJ SOLN
INTRAMUSCULAR | Status: DC | PRN
  Administered 2021-04-27: 2 mg via INTRAVENOUS

## 2021-04-27 MED ORDER — VERAPAMIL HCL 2.5 MG/ML IV SOLN
INTRAVENOUS | Status: DC | PRN
  Administered 2021-04-27: 17:00:00 10 mL via INTRA_ARTERIAL

## 2021-04-27 MED ORDER — ASPIRIN 81 MG PO CHEW
324.0000 mg | CHEWABLE_TABLET | Freq: Once | ORAL | Status: AC
  Administered 2021-04-27: 14:00:00 324 mg via ORAL
  Filled 2021-04-27: qty 4

## 2021-04-27 MED ORDER — HEPARIN (PORCINE) IN NACL 1000-0.9 UT/500ML-% IV SOLN
INTRAVENOUS | Status: AC
  Filled 2021-04-27: qty 1000

## 2021-04-27 MED ORDER — FENTANYL CITRATE (PF) 100 MCG/2ML IJ SOLN
INTRAMUSCULAR | Status: AC
  Filled 2021-04-27: qty 2

## 2021-04-27 MED ORDER — IOHEXOL 350 MG/ML SOLN
100.0000 mL | Freq: Once | INTRAVENOUS | Status: AC | PRN
  Administered 2021-04-27: 14:00:00 80 mL via INTRAVENOUS

## 2021-04-27 MED ORDER — ENOXAPARIN SODIUM 40 MG/0.4ML IJ SOSY: 40.0000 mg | PREFILLED_SYRINGE | INTRAMUSCULAR | Status: DC

## 2021-04-27 MED ORDER — ENOXAPARIN SODIUM 40 MG/0.4ML IJ SOSY
40.0000 mg | PREFILLED_SYRINGE | INTRAMUSCULAR | Status: DC
  Administered 2021-04-28 – 2021-04-29 (×2): 40 mg via SUBCUTANEOUS
  Filled 2021-04-27 (×2): qty 0.4

## 2021-04-27 MED ORDER — ONDANSETRON HCL 4 MG/2ML IJ SOLN: 4.0000 mg | Freq: Four times a day (QID) | INTRAMUSCULAR | Status: DC | PRN

## 2021-04-27 MED ORDER — TICAGRELOR 90 MG PO TABS
ORAL_TABLET | ORAL | Status: DC | PRN
  Administered 2021-04-27: 180 mg via ORAL

## 2021-04-27 MED ORDER — HEPARIN SODIUM (PORCINE) 1000 UNIT/ML IJ SOLN
INTRAMUSCULAR | Status: AC
  Filled 2021-04-27: qty 10

## 2021-04-27 MED ORDER — ASPIRIN 81 MG PO CHEW: 324.0000 mg | CHEWABLE_TABLET | ORAL | Status: AC

## 2021-04-27 MED ORDER — ASPIRIN 81 MG PO CHEW
81.0000 mg | CHEWABLE_TABLET | Freq: Every day | ORAL | Status: DC
  Administered 2021-04-28 – 2021-04-29 (×2): 81 mg via ORAL
  Filled 2021-04-27 (×2): qty 1

## 2021-04-27 MED ORDER — CHLORHEXIDINE GLUCONATE CLOTH 2 % EX PADS
6.0000 | MEDICATED_PAD | Freq: Every day | CUTANEOUS | Status: DC
  Administered 2021-04-28: 09:00:00 6 via TOPICAL

## 2021-04-27 MED ORDER — LABETALOL HCL 5 MG/ML IV SOLN: 10.0000 mg | INTRAVENOUS | Status: AC | PRN

## 2021-04-27 MED ORDER — FENTANYL CITRATE (PF) 100 MCG/2ML IJ SOLN
INTRAMUSCULAR | Status: DC | PRN
  Administered 2021-04-27: 25 ug via INTRAVENOUS

## 2021-04-27 MED ORDER — SODIUM CHLORIDE 0.9% FLUSH
3.0000 mL | Freq: Two times a day (BID) | INTRAVENOUS | Status: DC
  Administered 2021-04-28 – 2021-04-29 (×3): 3 mL via INTRAVENOUS

## 2021-04-27 SURGICAL SUPPLY — 18 items
BALLN SAPPHIRE 2.5X12 (BALLOONS) ×3
BALLN SAPPHIRE ~~LOC~~ 3.75X12 (BALLOONS) ×2 IMPLANT
BALLOON SAPPHIRE 2.5X12 (BALLOONS) IMPLANT
CATH INFINITI 5FR MULTPACK ANG (CATHETERS) ×2 IMPLANT
CATH LAUNCHER 6FR AL1 (CATHETERS) IMPLANT
CATH LAUNCHER 6FR JR4 (CATHETERS) ×2 IMPLANT
CATHETER LAUNCHER 6FR AL1 (CATHETERS) ×3
DEVICE RAD COMP TR BAND LRG (VASCULAR PRODUCTS) ×2 IMPLANT
GLIDESHEATH SLEND SS 6F .021 (SHEATH) ×2 IMPLANT
GUIDEWIRE INQWIRE 1.5J.035X260 (WIRE) IMPLANT
INQWIRE 1.5J .035X260CM (WIRE) ×3
KIT ENCORE 26 ADVANTAGE (KITS) ×2 IMPLANT
KIT HEART LEFT (KITS) ×3 IMPLANT
PACK CARDIAC CATHETERIZATION (CUSTOM PROCEDURE TRAY) ×3 IMPLANT
STENT ONYX FRONTIER 3.5X22 (Permanent Stent) ×2 IMPLANT
TRANSDUCER W/STOPCOCK (MISCELLANEOUS) ×3 IMPLANT
TUBING CIL FLEX 10 FLL-RA (TUBING) ×3 IMPLANT
WIRE ASAHI PROWATER 180CM (WIRE) ×2 IMPLANT

## 2021-04-27 NOTE — ED Provider Notes (Signed)
[place] EMERGENCY DEPT Provider Note   CSN: [id number] Arrival date & time: 04/27/21  1225     History Chief Complaint  Patient presents with   Chest Pain    Leroy Campbell. is a 60 y.o. male.  With past medical history of gout who presents to the emergency department with chest pain.  Patient states that for the past 3 to 4 days he has had symptoms that he feels like his acid reflux.  He states that he has had central to left-sided, nonradiating chest tightness that he noticed mostly after meals.  He states however that yesterday he was walking up the stairs and felt like his blood pressure was increasing, he had shortness of breath, chest tightness, flushing and states that he "did not feel good."  He states that he then went to vacuum his car and again his blood pressure increased his heart rate increased and he felt "poor" with nausea.  He states that he went to Dr. Dalene Seltzer, his primary care physician this morning who took an EKG and sent him here because it was abnormal.  He states that he is currently having chest pain that is central to left-sided, nonradiating and is not short of breath at this time.  He is not currently nauseated.  Denies palpitations, leg swelling, cough or upper respiratory symptoms, abdominal pain or back pain.   Chest Pain Associated symptoms: nausea and shortness of breath   Associated symptoms: no abdominal pain, no cough, no dizziness, no fever, no palpitations and no vomiting       Past Medical History:  Diagnosis Date   Gout     Patient Active Problem List   Diagnosis Date Noted   Preventative health care 11/17/2015   Chest discomfort 09/13/2013   EKG, abnormal 09/13/2013   Acute bronchitis 05/28/2013   Gout 02/18/2013   Obesity (BMI 30-39.9) 02/18/2013    Past Surgical History:  Procedure Laterality Date   KNEE ARTHROSCOPY Bilateral    x2 bilaterally   KNEE SURGERY Bilateral    x's 4-- 2 surgeries on both knees    LEFT AND RIGHT HEART CATHETERIZATION WITH CORONARY ANGIOGRAM N/A 10/19/2013   Procedure: LEFT AND RIGHT HEART CATHETERIZATION WITH CORONARY ANGIOGRAM;  Surgeon: Sinclair Grooms, MD;  Location: [facility] CATH LAB;  Service: Cardiovascular;  Laterality: N/A;   SHOULDER SURGERY Right 05/06/2009   TONSILLECTOMY AND ADENOIDECTOMY         Family History  Problem Relation Age of Onset   Heart disease Maternal Grandfather    Diabetes Paternal Grandmother    Heart disease Mother        Blockage    Social History   Tobacco Use   Smoking status: Never   Smokeless tobacco: Never  Substance Use Topics   Alcohol use: Yes    Comment: Rarely   Drug use: No    Home Medications Prior to Admission medications   Medication Sig Start Date End Date Taking? Authorizing Provider  allopurinol (ZYLOPRIM) 300 MG tablet TAKE (1) TABLET BY MOUTH ONCE DAILY. 09/26/17   Roma Schanz R, DO  indomethacin (INDOCIN) 50 MG capsule TAKE 1 CAPSULE TWICE DAILY WITH A MEAL. 11/19/16   Ann Held, DO    Allergies    Bee venom  Review of Systems   Review of Systems  Constitutional:  Positive for activity change. Negative for fever.  Respiratory:  Positive for shortness of breath. Negative for cough.   Cardiovascular:  Positive  for chest pain. Negative for palpitations and leg swelling.  Gastrointestinal:  Positive for nausea. Negative for abdominal pain, diarrhea and vomiting.  Neurological:  Negative for dizziness, syncope and light-headedness.  All other systems reviewed and are negative.  Physical Exam Updated Vital Signs BP 134/86 (BP Location: Right Arm)    Pulse 71    Temp 98.1 F (36.7 C)    Resp 18    Ht 6' (1.829 m)    Wt 113.4 kg    SpO2 99%    BMI 33.91 kg/m   Physical Exam Vitals and nursing note reviewed.  Constitutional:      General: He is not in acute distress.    Appearance: Normal appearance. He is well-developed and normal weight. He is not ill-appearing or  toxic-appearing.  HENT:     Head: Normocephalic and atraumatic.     Mouth/Throat:     Mouth: Mucous membranes are moist.     Pharynx: Oropharynx is clear.  Eyes:     General: No scleral icterus.    Extraocular Movements: Extraocular movements intact.     Pupils: Pupils are equal, round, and reactive to light.  Neck:     Vascular: No JVD.  Cardiovascular:     Rate and Rhythm: Normal rate and regular rhythm.     Pulses: Normal pulses.          Radial pulses are 2+ on the right side and 2+ on the left side.       Posterior tibial pulses are 2+ on the right side and 2+ on the left side.     Heart sounds: Normal heart sounds. No murmur heard. Pulmonary:     Effort: Pulmonary effort is normal. No tachypnea or respiratory distress.     Breath sounds: Normal breath sounds.  Chest:     Chest wall: No tenderness.  Abdominal:     General: Bowel sounds are normal. There is no distension.     Palpations: Abdomen is soft.     Tenderness: There is no abdominal tenderness.  Musculoskeletal:        General: Normal range of motion.     Cervical back: Normal range of motion and neck supple.     Right lower leg: No tenderness. No edema.     Left lower leg: No tenderness. No edema.  Skin:    General: Skin is warm and dry.     Capillary Refill: Capillary refill takes less than 2 seconds.  Neurological:     General: No focal deficit present.     Mental Status: He is alert and oriented to person, place, and time. Mental status is at baseline.  Psychiatric:        Mood and Affect: Mood normal.        Behavior: Behavior normal.        Thought Content: Thought content normal.        Judgment: Judgment normal.    ED Results / Procedures / Treatments   Labs (all labs ordered are listed, but only abnormal results are displayed) Labs Reviewed  BASIC METABOLIC PANEL - Abnormal; Notable for the following components:      Result Value   Glucose, Bld 116 (*)    All other components within normal  limits  TROPONIN I (HIGH SENSITIVITY) - Abnormal; Notable for the following components:   Troponin I (High Sensitivity) 2,649 (*)    All other components within normal limits  TROPONIN I (HIGH SENSITIVITY) - Abnormal; Notable for the  following components:   Troponin I (High Sensitivity) 4,571 (*)    All other components within normal limits  RESP PANEL BY RT-PCR (FLU A&B, COVID) ARPGX2  CBC  BRAIN NATRIURETIC PEPTIDE   EKG Please see MUSE for Dr. Dina Rich read on EKG  Radiology DG Chest 2 View  Result Date: 04/27/2021 CLINICAL DATA:  Pt presents from PCP for CP and EKG re-evaluation. Pt states CP has been intermittent for three days, worsening today. Pt also endorses SOB. EXAM: CHEST - 2 VIEW COMPARISON:  10/13/2013 FINDINGS: Lungs are clear. Heart size and mediastinal contours are within normal limits. No effusion. Visualized bones unremarkable. IMPRESSION: No acute cardiopulmonary disease. Electronically Signed   By: Lucrezia Europe M.D.   On: 04/27/2021 12:56   CT Angio Chest PE W and/or Wo Contrast  Result Date: 04/27/2021 CLINICAL DATA:  Intermittent chest pain for 3 days. EXAM: CT ANGIOGRAPHY CHEST WITH CONTRAST TECHNIQUE: Multidetector CT imaging of the chest was performed using the standard protocol during bolus administration of intravenous contrast. Multiplanar CT image reconstructions and MIPs were obtained to evaluate the vascular anatomy. CONTRAST:  35mL OMNIPAQUE IOHEXOL 350 MG/ML SOLN COMPARISON:  None. FINDINGS: Cardiovascular: Satisfactory opacification of the pulmonary arteries to the segmental level. No evidence of pulmonary embolism. Normal heart size. Mild coronary artery atherosclerotic calcifications. No pericardial effusion. Mediastinum/Nodes: No enlarged mediastinal, hilar, or axillary lymph nodes. Thyroid gland, trachea, and esophagus demonstrate no significant findings. Lungs/Pleura: Lungs are clear. No pleural effusion or pneumothorax. Upper Abdomen: Spleen is enlarged  measuring 15 cm in craniocaudal dimension. No acute abdominal process. Musculoskeletal: No chest wall abnormality. No acute or significant osseous findings. Review of the MIP images confirms the above findings. IMPRESSION: 1. No evidence of pulmonary embolism. 2. No acute cardiopulmonary process. 3. Splenomegaly. Electronically Signed   By: Keane Police D.O.   On: 04/27/2021 14:52    Procedures .Critical Care Performed by: Mickie Hillier, PA-C Authorized by: Mickie Hillier, PA-C   Critical care provider statement:    Critical care time (minutes):  40   Critical care time was exclusive of:  Separately billable procedures and treating other patients   Critical care was necessary to treat or prevent imminent or life-threatening deterioration of the following conditions:  Cardiac failure   Critical care was time spent personally by me on the following activities:  Development of treatment plan with patient or surrogate, discussions with consultants, evaluation of patient's response to treatment, discussions with primary provider, examination of patient, obtaining history from patient or surrogate, review of old charts, re-evaluation of patient's condition, pulse oximetry, ordering and review of radiographic studies, ordering and review of laboratory studies and ordering and performing treatments and interventions   I assumed direction of critical care for this patient from another provider in my specialty: no     Care discussed with: admitting provider     Medications Ordered in ED Medications  nitroGLYCERIN (NITROSTAT) SL tablet 0.4 mg ( Sublingual MAR Hold 04/27/21 1644)  nitroGLYCERIN 50 mg in dextrose 5 % 250 mL (0.2 mg/mL) infusion ( Intravenous MAR Hold 04/27/21 1644)  heparin ADULT infusion 100 units/mL (25000 units/287mL) (1,300 Units/hr Intravenous New Bag/Given 04/27/21 1509)  fentaNYL (SUBLIMAZE) injection (25 mcg Intravenous Given 04/27/21 1704)  midazolam (VERSED) injection (2 mg  Intravenous Given 04/27/21 1704)  lidocaine (PF) (XYLOCAINE) 1 % injection (2 mLs  Given 04/27/21 1706)  Heparin (Porcine) in NaCl 1000-0.9 UT/500ML-% SOLN (500 mLs  Given 04/27/21 1707)  aspirin chewable tablet 324 mg (  324 mg Oral Given 04/27/21 1410)  heparin injection 4,000 Units (4,000 Units Intravenous Given 04/27/21 1409)  iohexol (OMNIPAQUE) 350 MG/ML injection 100 mL (80 mLs Intravenous Contrast Given 04/27/21 1421)    ED Course  I have reviewed the triage vital signs and the nursing notes.  Pertinent labs & imaging results that were available during my care of the patient were reviewed by me and considered in my medical decision making (see chart for details).    MDM Rules/Calculators/A&P 60 year old male who presents to the emergency department with chest pain.  EKG unchanged from previous with right bundle branch block. Initial troponin 2649.  Immediately ordered aspirin 324 mg, 0.4 mg sublingual nitro given ongoing chest pain and bolus heparin 4000 units.  Repeating EKG with ongoing chest pain. -Appears to be having NSTEMI at this time.  Consulted cardiology 0375 -Repeat EKG without changes.  Will CTA chest to rule out PE.  Obtaining BNP as well Chest x-ray without pneumothorax, pneumonia or other intrathoracic abnormalities.  59: Spoke with Dr. Percival Spanish with cardiology who agrees to accept the patient.  States that he will try and get him to the Cath Lab this evening.  Patient continuing to have chest pain despite x3 nitroglycerin.  Starting on nitro drip until pain-free.  CTA PE without any pulmonary embolus evident.  Starting patient on heparin drip.  BNP normal.  Patient being transferred to [facility]. at time of transfer patient is hemodynamically stable. Final Clinical Impression(s) / ED Diagnoses Final diagnoses:  Non-ST elevation (NSTEMI) myocardial infarction [facility])    Rx / DC Orders ED Discharge Orders     None        Mickie Hillier, PA-C 04/27/21  1710    Horton, Alvin Critchley, DO 05/01/21 1638

## 2021-04-27 NOTE — ED Notes (Signed)
Constant chest pain, denies N/V, jaw pain. States some shortness of breath. 4/10 pain.

## 2021-04-27 NOTE — ED Notes (Signed)
EDP at bedside  

## 2021-04-27 NOTE — ED Triage Notes (Signed)
Pt presents from PCP for CP and EKG re-evaluation. Pt states CP has been intermittent for three days, worsening today. Pt also endorses SOB.

## 2021-04-27 NOTE — ED Notes (Signed)
[facility] called to place consult with Cardiology

## 2021-04-27 NOTE — H&P (Signed)
CARDIOLOGY ADMISSION NOTE  Patient ID: Leroy Campbell. MRN: [id number] DOB/AGE: November 10, 1960 60 y.o.  Admit date: 04/27/2021 Primary Physician   [name], PA Primary Cardiologist   None Chief Complaint      Chest pain.   HPI:   The patient had a past history of chest pain and a past history of RBBB and inferior Q waves.  He had diaphragmatic attenuation on perfusion imaging.  Cath was completed in 2015 and he had normal coronaries.  He presents now with chest pain.  Pain was substernal and 8/10.  He felt SOB and nausea.  This was not like his previous chest pain.  It was at rest.   He had been feeling well prior to this.   In the [facility] ED he had elevated troponin.  Pain did not completely resolve with NTG x 4 and heparin.  EKG demonstrates mild inferior ST elevation.  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  High sensitivity troponin was 2,649.   CT to rule out PE did not demonstrate any acute abnormalities.     Past Medical History:  Diagnosis Date   Gout     Past Surgical History:  Procedure Laterality Date   KNEE ARTHROSCOPY Bilateral    x2 bilaterally   KNEE SURGERY Bilateral    x's 4-- 2 surgeries on both knees   LEFT AND RIGHT HEART CATHETERIZATION WITH CORONARY ANGIOGRAM N/A 10/19/2013   Procedure: LEFT AND RIGHT HEART CATHETERIZATION WITH CORONARY ANGIOGRAM;  Surgeon: Sinclair Grooms, MD;  Location: [facility] CATH LAB;  Service: Cardiovascular;  Laterality: N/A;   SHOULDER SURGERY Right 05/06/2009   TONSILLECTOMY AND ADENOIDECTOMY      Allergies  Allergen Reactions   Bee Venom    No current facility-administered medications on file prior to encounter.   Current Outpatient Medications on File Prior to Encounter  Medication Sig Dispense Refill   allopurinol (ZYLOPRIM) 100 MG tablet Take 200 mg by mouth 2 (two) times daily.     allopurinol (ZYLOPRIM) 300 MG tablet TAKE (1) TABLET BY MOUTH ONCE DAILY. 30 tablet 0   indomethacin (INDOCIN) 50 MG capsule  TAKE 1 CAPSULE TWICE DAILY WITH A MEAL. 60 capsule 0   Social History   Socioeconomic History   Marital status: Legally Separated    Spouse name: Not on file   Number of children: Not on file   Years of education: Not on file   Highest education level: Not on file  Occupational History   Not on file  Tobacco Use   Smoking status: Never   Smokeless tobacco: Never  Substance and Sexual Activity   Alcohol use: Yes    Comment: Rarely   Drug use: No   Sexual activity: Yes    Partners: Female  Other Topics Concern   Not on file  Social History Narrative   Exercise-- bike, coaching basketball,  softball   Social Determinants of Health   Financial Resource Strain: Not on file  Food Insecurity: Not on file  Transportation Needs: Not on file  Physical Activity: Not on file  Stress: Not on file  Social Connections: Not on file  Intimate Partner Violence: Not on file    Family History  Problem Relation Age of Onset   Heart disease Maternal Grandfather    Diabetes Paternal Grandmother    Heart disease Mother        Blockage     ROS:  As stated in the HPI and negative  for all other systems.  Physical Exam: Blood pressure 118/73, pulse 64, temperature 98.1 F (36.7 C), resp. rate (!) 22, height 6' (1.829 m), weight 113.4 kg, SpO2 96 %.  GENERAL:  Well appearing NECK:  No jugular venous distention, waveform within normal limits, carotid upstroke brisk and symmetric, no bruits, no thyromegaly LUNGS:  Clear to auscultation bilaterally CHEST:  Unremarkable HEART:  PMI not displaced or sustained,S1 and S2 within normal limits, no S3, no S4, no clicks, no rubs, no murmurs ABD:  Flat, positive bowel sounds normal in frequency in pitch, no bruits, no rebound, no guarding, no midline pulsatile mass, no hepatomegaly, no splenomegaly EXT:  2 plus pulses throughout, no edema, no cyanosis no clubbing  Labs: Lab Results  Component Value Date   BUN 13 04/27/2021   Lab Results   Component Value Date   CREATININE 0.95 04/27/2021   Lab Results  Component Value Date   NA 138 04/27/2021   K 4.2 04/27/2021   CL 103 04/27/2021   CO2 25 04/27/2021   Lab Results  Component Value Date   TROPONINI 0.01 09/13/2013   Lab Results  Component Value Date   WBC 6.5 04/27/2021   HGB 16.3 04/27/2021   HCT 47.5 04/27/2021   MCV 85.7 04/27/2021   PLT 170 04/27/2021   Lab Results  Component Value Date   CHOL 193 11/17/2015   HDL 35.50 (L) 11/17/2015   LDLCALC 134 (H) 11/17/2015   LDLDIRECT 142.0 05/25/2013   TRIG 118.0 11/17/2015   CHOLHDL 5 11/17/2015   Lab Results  Component Value Date   ALT 17 11/17/2015   AST 24 11/17/2015   ALKPHOS 97 11/17/2015   BILITOT 0.7 11/17/2015      Radiology:  CXR:  NAD  EKG:  NSR, rate 73, axis WNL, intervals WNL, old inferior Q waves, inferior 1/2 mm ST elevation consistent with acute inferior injury pattern.    ASSESSMENT AND PLAN:    ACUTE INFERIOR MI:  EKG does look different than previous EKG and he will be brought straight to the cath lab.   He has been treated with ASA and heparin.      DYSLIPIDEMIA:  Start high dose statin.      SignedGraydon Fofana 04/27/2021, 3:26 PM

## 2021-04-27 NOTE — Progress Notes (Signed)
ANTICOAGULATION CONSULT NOTE - Initial Consult  Pharmacy Consult for heparin Indication: chest pain/ACS  Allergies  Allergen Reactions   Bee Venom     Patient Measurements: Height: 6' (182.9 cm) Weight: 113.4 kg (250 lb) IBW/kg (Calculated) : 77.6 Heparin Dosing Weight: 101 kg   Vital Signs: Temp: 98.1 F (36.7 C) (12/23 1239) BP: 115/78 (12/23 1417) Pulse Rate: 72 (12/23 1417)  Labs: Recent Labs    04/27/21 1245  HGB 16.3  HCT 47.5  PLT 170  CREATININE 0.95  TROPONINIHS 2,649*    Estimated Creatinine Clearance: 110.2 mL/min (by C-G formula based on SCr of 0.95 mg/dL).   Medical History: Past Medical History:  Diagnosis Date   Gout     Medications:  (Not in a hospital admission)   Assessment: 7 YOM with chest pain and elevated troponins concerning for NSTEMI. Pharmacy consulted to start IV heparin. H/H and Plt wnl. SCr wnl  Goal of Therapy:  Heparin level 0.3-0.7 units/ml Monitor platelets by anticoagulation protocol: Yes   Plan:  -Heparin 4000 unit bolus followed by heparin infusion at 1300 units/hr -Potential LHC this evening. F/u 6 hr HL vs. Plans after cath -Monitor for bleeding   Albertina Parr, PharmD., BCPS, BCCCP Clinical Pharmacist Please refer to [facility] for unit-specific pharmacist

## 2021-04-28 ENCOUNTER — Inpatient Hospital Stay (HOSPITAL_COMMUNITY): Payer: No Typology Code available for payment source

## 2021-04-28 DIAGNOSIS — I2111 ST elevation (STEMI) myocardial infarction involving right coronary artery: Secondary | ICD-10-CM

## 2021-04-28 DIAGNOSIS — I214 Non-ST elevation (NSTEMI) myocardial infarction: Secondary | ICD-10-CM | POA: Diagnosis not present

## 2021-04-28 LAB — BASIC METABOLIC PANEL
Anion gap: 9 (ref 5–15)
BUN: 10 mg/dL (ref 6–20)
CO2: 28 mmol/L (ref 22–32)
Calcium: 9.4 mg/dL (ref 8.9–10.3)
Chloride: 100 mmol/L (ref 98–111)
Creatinine, Ser: 1.11 mg/dL (ref 0.61–1.24)
GFR, Estimated: >60 mL/min (ref >60)
Glucose, Bld: 156 mg/dL — ABNORMAL HIGH (ref 70–99)
Potassium: 4 mmol/L (ref 3.5–5.1)
Sodium: 137 mmol/L (ref 135–145)

## 2021-04-28 LAB — CBC
HCT: 46.1 % (ref 39.0–52.0)
Hemoglobin: 15.6 g/dL (ref 13.0–17.0)
MCH: 29.7 pg (ref 26.0–34.0)
MCHC: 33.8 g/dL (ref 30.0–36.0)
MCV: 87.8 fL (ref 80.0–100.0)
Platelets: 158 10*3/uL (ref 150–400)
RBC: 5.25 MIL/uL (ref 4.22–5.81)
RDW: 12.9 % (ref 11.5–15.5)
WBC: 7.6 10*3/uL (ref 4.0–10.5)
nRBC: 0 % (ref 0.0–0.2)

## 2021-04-28 LAB — ECHOCARDIOGRAM COMPLETE
AR max vel: 2.78 cm2
AV Area VTI: 2.8 cm2
AV Area mean vel: 2.88 cm2
AV Mean grad: 3 mmHg
AV Peak grad: 6.1 mmHg
Ao pk vel: 1.23 m/s
Area-P 1/2: 3.28 cm2
Calc EF: 56.8 %
Height: 72 in
S' Lateral: 3.2 cm
Single Plane A2C EF: 59.6 %
Single Plane A4C EF: 57 %
Weight: 4000 oz

## 2021-04-28 LAB — LIPID PANEL
Cholesterol: 213 mg/dL — ABNORMAL HIGH (ref 0–200)
HDL: 41 mg/dL (ref >40)
LDL Cholesterol: 134 mg/dL — ABNORMAL HIGH (ref 0–99)
Total CHOL/HDL Ratio: 5.2 RATIO
Triglycerides: 191 mg/dL — ABNORMAL HIGH (ref <150)
VLDL: 38 mg/dL (ref 0–40)

## 2021-04-28 MED ORDER — LOSARTAN POTASSIUM 25 MG PO TABS
25.0000 mg | ORAL_TABLET | Freq: Every day | ORAL | Status: DC
  Administered 2021-04-29: 08:00:00 25 mg via ORAL
  Filled 2021-04-28: qty 1

## 2021-04-28 MED ORDER — SIMETHICONE 80 MG PO CHEW
160.0000 mg | CHEWABLE_TABLET | Freq: Four times a day (QID) | ORAL | Status: DC | PRN
  Administered 2021-04-28: 13:00:00 160 mg via ORAL
  Filled 2021-04-28: qty 2

## 2021-04-28 MED ORDER — SIMETHICONE 80 MG PO CHEW
80.0000 mg | CHEWABLE_TABLET | Freq: Four times a day (QID) | ORAL | Status: DC | PRN
  Administered 2021-04-28: 12:00:00 80 mg via ORAL
  Filled 2021-04-28: qty 1

## 2021-04-28 NOTE — Progress Notes (Signed)
Pt arrived to the floor with significant other Alert and oriented, pt ambulated to bed. CHG bath given , Cardiac monitoring initiated, PT oriented to room and staff, will continue to monitor.

## 2021-04-28 NOTE — Progress Notes (Signed)
Advanced Heart Failure Rounding Note   Subjective:    Admitted  12/23 with inferior stemi Hstrop 8300  Cath 100%mRCA -> PCI/DES  Echo this am (reviewed personally) EF 45% inferior/inferoseptal AK moderate RV dysfunction   Denies CP or SOB  Objective:   Weight Range:  Vital Signs:   Temp:  [97.9 F (36.6 C)-98.2 F (36.8 C)] 98.2 F (36.8 C) (12/24 0800) Pulse Rate:  [62-82] 75 (12/24 0900) Resp:  [11-30] 17 (12/24 0900) BP: (107-156)/(67-98) 137/78 (12/24 0900) SpO2:  [94 %-100 %] 97 % (12/24 0900) Weight:  [113.4 kg] 113.4 kg (12/23 1236) Last BM Date: 04/27/21  Weight change: Filed Weights   04/27/21 1236  Weight: 113.4 kg    Intake/Output:   Intake/Output Summary (Last 24 hours) at 04/28/2021 1002 Last data filed at 04/28/2021 0903 Gross per 24 hour  Intake 245.32 ml  Output 1380 ml  Net -1134.68 ml     Physical Exam: General:  Well appearing. No resp difficulty HEENT: normal Neck: supple. JVP . Carotids 2+ bilat; no bruits. No lymphadenopathy or thryomegaly appreciated. Cor: PMI nondisplaced. Regular rate & rhythm. No rubs, gallops or murmurs. Lungs: clear Abdomen: soft, nontender, nondistended. No hepatosplenomegaly. No bruits or masses. Good bowel sounds. Extremities: no cyanosis, clubbing, rash, edema Neuro: alert & orientedx3, cranial nerves grossly intact. moves all 4 extremities w/o difficulty. Affect pleasant  Telemetry: NSR 70s Personally reviewed  Labs: Basic Metabolic Panel: Recent Labs  Lab 04/27/21 1245 04/27/21 2004 04/28/21 0113  NA 138  --  137  K 4.2  --  4.0  CL 103  --  100  CO2 25  --  28  GLUCOSE 116*  --  156*  BUN 13  --  10  CREATININE 0.95 1.07 1.11  CALCIUM 9.6  --  9.4    Liver Function Tests: No results for input(s): AST, ALT, ALKPHOS, BILITOT, PROT, ALBUMIN in the last 168 hours. No results for input(s): LIPASE, AMYLASE in the last 168 hours. No results for input(s): AMMONIA in the last 168  hours.  CBC: Recent Labs  Lab 04/27/21 1245 04/27/21 2004 04/28/21 0113  WBC 6.5 7.0 7.6  HGB 16.3 16.3 15.6  HCT 47.5 48.0 46.1  MCV 85.7 87.9 87.8  PLT 170 157 158    Cardiac Enzymes: No results for input(s): CKTOTAL, CKMB, CKMBINDEX, TROPONINI in the last 168 hours.  BNP: BNP (last 3 results) Recent Labs    04/27/21 1450  BNP 52.5    ProBNP (last 3 results) No results for input(s): PROBNP in the last 8760 hours.    Other results:  Imaging: DG Chest 2 View  Result Date: 04/27/2021 CLINICAL DATA:  Pt presents from PCP for CP and EKG re-evaluation. Pt states CP has been intermittent for three days, worsening today. Pt also endorses SOB. EXAM: CHEST - 2 VIEW COMPARISON:  10/13/2013 FINDINGS: Lungs are clear. Heart size and mediastinal contours are within normal limits. No effusion. Visualized bones unremarkable. IMPRESSION: No acute cardiopulmonary disease. Electronically Signed   By: Lucrezia Europe M.D.   On: 04/27/2021 12:56   CT Angio Chest PE W and/or Wo Contrast  Result Date: 04/27/2021 CLINICAL DATA:  Intermittent chest pain for 3 days. EXAM: CT ANGIOGRAPHY CHEST WITH CONTRAST TECHNIQUE: Multidetector CT imaging of the chest was performed using the standard protocol during bolus administration of intravenous contrast. Multiplanar CT image reconstructions and MIPs were obtained to evaluate the vascular anatomy. CONTRAST:  71mL OMNIPAQUE IOHEXOL 350 MG/ML SOLN COMPARISON:  None. FINDINGS: Cardiovascular: Satisfactory opacification of the pulmonary arteries to the segmental level. No evidence of pulmonary embolism. Normal heart size. Mild coronary artery atherosclerotic calcifications. No pericardial effusion. Mediastinum/Nodes: No enlarged mediastinal, hilar, or axillary lymph nodes. Thyroid gland, trachea, and esophagus demonstrate no significant findings. Lungs/Pleura: Lungs are clear. No pleural effusion or pneumothorax. Upper Abdomen: Spleen is enlarged measuring 15 cm in  craniocaudal dimension. No acute abdominal process. Musculoskeletal: No chest wall abnormality. No acute or significant osseous findings. Review of the MIP images confirms the above findings. IMPRESSION: 1. No evidence of pulmonary embolism. 2. No acute cardiopulmonary process. 3. Splenomegaly. Electronically Signed   By: Keane Police D.O.   On: 04/27/2021 14:52   CARDIAC CATHETERIZATION  Result Date: 04/27/2021   Prox LAD lesion is 35% stenosed.   1st Diag lesion is 50% stenosed.   Mid Cx to Dist Cx lesion is 20% stenosed.   Mid RCA lesion is 100% stenosed.   A drug-eluting stent was successfully placed using a STENT ONYX FRONTIER 3.5X22.   Post intervention, there is a 0% residual stenosis.   There is mild left ventricular systolic dysfunction.   LV end diastolic pressure is normal.   The left ventricular ejection fraction is 45-50% by visual estimate. 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 Mild LV dysfunction Normal LVEDP Successful PCI of the mid RCA with DES x 1 Plan: DAPT for one year. May be on fast track for DC depending on clinical course.     Medications:     Scheduled Medications:  aspirin  324 mg Oral NOW   Or   aspirin  300 mg Rectal NOW   aspirin  81 mg Oral Daily   atorvastatin  80 mg Oral Daily   Chlorhexidine Gluconate Cloth  6 each Topical Q0600   enoxaparin (LOVENOX) injection  40 mg Subcutaneous Q24H   metoprolol succinate  25 mg Oral Daily   sodium chloride flush  3 mL Intravenous Q12H   ticagrelor  90 mg Oral BID    Infusions:  sodium chloride      PRN Medications: sodium chloride, acetaminophen, nitroGLYCERIN, ondansetron (ZOFRAN) IV, sodium chloride flush   Assessment/Plan:   1. CAD with acute inferior STEMI - cath 12/23 LAD 35%, LCX 20%, RCA  m100% -> PCI/DES to RCA - hstrop 8,346 - now pain free  - EF 45-50% on cath - Echo this am (reviewed personally) EF 45% inferior/inferoseptal AK moderate RV dysfunction  - Continue  DAPT/b-blocker/statin  - CR to see  - Can go to floor. Home tomorrow  2. Hyperlipidemia - LDL 134 - on high potency statin  3. Ischemic CM - echo as above - start ARB. Continue Toprol   4. Elevated blood glucose - hgbA1c 5.4  Length of Stay: 1   Glori Bickers MD 04/28/2021, 10:02 AM  Advanced Heart Failure Team Pager [phone] (M-F; 7a - 4p)  Please contact [facility] for night-coverage after hours (4p -7a ) and weekends on [email or web address]

## 2021-04-28 NOTE — Progress Notes (Signed)
CARDIAC REHAB PHASE I   PRE:  Rate/Rhythm: 61 SR  BP:  Supine: 131/94     SaO2: 96 RA  MODE:  Ambulation: 370 ft   POST:  Rate/Rhythm: 70 SR  BP:  Sitting: 125/61      SaO2: 97 RA  Pt tolerated exercise well and AMB 370 ft with no assistive device, and standby assist. Pt had no rest break, chest pain, SOB or pain. Pt stated he has mild acid reflux feeling post walk. Education given to pt on heart healthy diet, radial/femoral weight restrictions, MI booklet, adherence to NTG, Brilinta and ASA.  Home exercise guidelines given and will refer to cardiac rehab phase 2 in [place] per patient request. Pt left in the bed with call bell in reach. All questions were answered and pt verbalized understanding.  Kirby Funk ACSM-CEP 04/28/2021 11:41 AM

## 2021-04-29 ENCOUNTER — Encounter (HOSPITAL_COMMUNITY): Payer: Self-pay | Admitting: Cardiology

## 2021-04-29 ENCOUNTER — Encounter: Payer: Self-pay | Admitting: Physician Assistant

## 2021-04-29 DIAGNOSIS — I2111 ST elevation (STEMI) myocardial infarction involving right coronary artery: Secondary | ICD-10-CM

## 2021-04-29 DIAGNOSIS — I255 Ischemic cardiomyopathy: Secondary | ICD-10-CM | POA: Diagnosis present

## 2021-04-29 DIAGNOSIS — E785 Hyperlipidemia, unspecified: Secondary | ICD-10-CM | POA: Diagnosis present

## 2021-04-29 MED ORDER — LOSARTAN POTASSIUM 25 MG PO TABS: 25.0000 mg | ORAL_TABLET | Freq: Every day | ORAL | 11 refills | Status: DC

## 2021-04-29 MED ORDER — ATORVASTATIN CALCIUM 80 MG PO TABS: 80.0000 mg | ORAL_TABLET | Freq: Every day | ORAL | 11 refills | Status: DC

## 2021-04-29 MED ORDER — TICAGRELOR 90 MG PO TABS: 90.0000 mg | ORAL_TABLET | Freq: Two times a day (BID) | ORAL | 11 refills | Status: DC

## 2021-04-29 MED ORDER — METOPROLOL SUCCINATE ER 25 MG PO TB24: 25.0000 mg | ORAL_TABLET | Freq: Every day | ORAL | 11 refills | Status: DC

## 2021-04-29 MED ORDER — NITROGLYCERIN 0.4 MG SL SUBL: 0.4000 mg | SUBLINGUAL_TABLET | SUBLINGUAL | 12 refills | Status: DC | PRN

## 2021-04-29 MED ORDER — ASPIRIN 81 MG PO CHEW: 81.0000 mg | CHEWABLE_TABLET | Freq: Every day | ORAL | Status: AC

## 2021-04-29 MED ORDER — ASPIRIN 81 MG PO CHEW: 81.0000 mg | CHEWABLE_TABLET | Freq: Every day | ORAL | Status: DC

## 2021-04-29 MED ORDER — NITROGLYCERIN 0.4 MG SL SUBL: 0.4000 mg | SUBLINGUAL_TABLET | SUBLINGUAL | 12 refills | Status: AC | PRN

## 2021-04-29 NOTE — Progress Notes (Signed)
Pt needs a work note, not in AVS, and no note has been written, Paged PA twice.   Chrisandra Carota, RN 04/29/2021 12:44 PM

## 2021-04-29 NOTE — Discharge Summary (Signed)
Discharge Summary    Patient ID: Leroy Campbell. MRN: [id number]; DOB: 03-28-1961  Admit date: 04/27/2021 Discharge date: 04/29/2021  PCP:  [name], PA   [facility] Providers Cardiologist:  [name], MD        Discharge Diagnoses    Principal Problem:   ST elevation myocardial infarction involving right coronary artery [facility]) Active Problems:   Ischemic cardiomyopathy   Hyperlipidemia    Diagnostic Studies/Procedures    ECHO: 04/28/2021  1. Left ventricular ejection fraction, by estimation, is 45 to 50%. The  left ventricle has mildly decreased function. The left ventricle  demonstrates regional wall motion abnormalities. Inferior hypokinesis.  There is mild left ventricular hypertrophy.  Left ventricular diastolic parameters are consistent with Grade I  diastolic dysfunction (impaired relaxation).   2. Right ventricular systolic function is mildly reduced. The right  ventricular size is normal. Tricuspid regurgitation signal is inadequate  for assessing PA pressure.   3. The mitral valve is normal in structure. No evidence of mitral valve  regurgitation. No evidence of mitral stenosis.   4. The aortic valve was not well visualized. Aortic valve regurgitation  is not visualized. No aortic stenosis is present.   5. The inferior vena cava is normal in size with greater than 50%  respiratory variability, suggesting right atrial pressure of 3 mmHg.  CARDIAC CATH: 04/27/2021   Prox LAD lesion is 35% stenosed.   1st Diag lesion is 50% stenosed.   Mid Cx to Dist Cx lesion is 20% stenosed.   Mid RCA lesion is 100% stenosed.   A drug-eluting stent was successfully placed using a STENT ONYX FRONTIER 3.5X22.   Post intervention, there is a 0% residual stenosis.   There is mild left ventricular systolic dysfunction.   LV end diastolic pressure is normal.   The left ventricular ejection fraction is 45-50% by visual estimate.   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 Mild LV dysfunction Normal LVEDP Successful PCI of the mid RCA with DES x 1   Plan: DAPT for one year. May be on fast track for DC depending on clinical course.  Diagnostic Dominance: Right Intervention   _____________   History of Present Illness     Leroy Monsanto. is a 59 y.o. male with history of normal coronary arteries 2015, gout, RBBB, but no history of diabetes, hypertension or hyperlipidemia.  He was admitted from [facility] ER with chest pain unrelieved by nitroglycerin x4, and elevated troponin >> NSTEMI  Hospital Course     Consultants: None  A CT was done to rule out PE but was negative.  His troponin reached 2649.  He was transferred [facility] for cath.  Cardiac catheterization showed an EF of 45% and an occluded RCA treated with drug-eluting stent.  Peak troponin 8436.  In 12/24, he was seen by Dr. Kae Heller for heart failure.  His echo showed an EF of 45% with wall motion abnormalities and moderate RV dysfunction.  He is to be continued on DAPT, beta-blocker and statin.  He was seen by cardiac rehab and ambulated without chest pain or shortness of breath.  He will be referred to outpatient cardiac rehab.  On 12/25, he was seen by Dr. Nechama Guard and all data were reviewed. He was doing well on aspirin and Brilinta plus beta-blocker and high-dose statin.  His LDL is 134, goal less than 70.  He was started on a low-dose of losartan in addition to the  beta-blocker.  Although his glucose was elevated, his hemoglobin A1c was 5.4.  He was doing well on 12/25.  No further inpatient work-up is indicated and he is considered stable for discharge, to follow-up as an outpatient.    Did the patient have an acute coronary syndrome (MI, NSTEMI, STEMI, etc) this admission?:  Yes                               AHA/ACC Clinical Performance & Quality Measures: Aspirin prescribed? - Yes ADP Receptor Inhibitor (Plavix/Clopidogrel,  Brilinta/Ticagrelor or Effient/Prasugrel) prescribed (includes medically managed patients)? - Yes Beta Blocker prescribed? - Yes High Intensity Statin (Lipitor 40-80mg  or Crestor 20-40mg ) prescribed? - Yes EF assessed during THIS hospitalization? - Yes For EF <40%, was ACEI/ARB prescribed? - Not Applicable (EF >/= 49%) For EF <40%, Aldosterone Antagonist (Spironolactone or Eplerenone) prescribed? - Not Applicable (EF >/= 67%) Cardiac Rehab Phase II ordered (including medically managed patients)? - Yes   _____________  Discharge Vitals Blood pressure 125/85, pulse 80, temperature 98.4 F (36.9 C), temperature source Oral, resp. rate 17, height 6' (1.829 m), weight 102.5 kg, SpO2 97 %.  Filed Weights   04/27/21 1236 04/28/21 2108  Weight: 113.4 kg 102.5 kg    Labs & Radiologic Studies    CBC Recent Labs    04/27/21 2004 04/28/21 0113  WBC 7.0 7.6  HGB 16.3 15.6  HCT 48.0 46.1  MCV 87.9 87.8  PLT 157 591   Basic Metabolic Panel Recent Labs    04/27/21 1245 04/27/21 2004 04/28/21 0113  NA 138  --  137  K 4.2  --  4.0  CL 103  --  100  CO2 25  --  28  GLUCOSE 116*  --  156*  BUN 13  --  10  CREATININE 0.95 1.07 1.11  CALCIUM 9.6  --  9.4   Liver Function Tests No results for input(s): AST, ALT, ALKPHOS, BILITOT, PROT, ALBUMIN in the last 72 hours. No results for input(s): LIPASE, AMYLASE in the last 72 hours. High Sensitivity Troponin:   Recent Labs  Lab 04/27/21 1245 04/27/21 1450 04/27/21 2004 04/27/21 2231  TROPONINIHS 2,649* 4,571* 7,280* 8,436*    BNP Invalid input(s): POCBNP D-Dimer No results for input(s): DDIMER in the last 72 hours. Hemoglobin A1C Recent Labs    04/27/21 2004  HGBA1C 5.4   Fasting Lipid Panel Recent Labs    04/28/21 0113  CHOL 213*  HDL 41  LDLCALC 134*  TRIG 191*  CHOLHDL 5.2   Thyroid Function Tests No results for input(s): TSH, T4TOTAL, T3FREE, THYROIDAB in the last 72 hours.  Invalid input(s):  FREET3 _____________  DG Chest 2 View  Result Date: 04/27/2021 CLINICAL DATA:  Pt presents from PCP for CP and EKG re-evaluation. Pt states CP has been intermittent for three days, worsening today. Pt also endorses SOB. EXAM: CHEST - 2 VIEW COMPARISON:  10/13/2013 FINDINGS: Lungs are clear. Heart size and mediastinal contours are within normal limits. No effusion. Visualized bones unremarkable. IMPRESSION: No acute cardiopulmonary disease. Electronically Signed   By: Lucrezia Europe M.D.   On: 04/27/2021 12:56   CT Angio Chest PE W and/or Wo Contrast  Result Date: 04/27/2021 CLINICAL DATA:  Intermittent chest pain for 3 days. EXAM: CT ANGIOGRAPHY CHEST WITH CONTRAST TECHNIQUE: Multidetector CT imaging of the chest was performed using the standard protocol during bolus administration of intravenous contrast. Multiplanar CT image reconstructions and MIPs  were obtained to evaluate the vascular anatomy. CONTRAST:  68mL OMNIPAQUE IOHEXOL 350 MG/ML SOLN COMPARISON:  None. FINDINGS: Cardiovascular: Satisfactory opacification of the pulmonary arteries to the segmental level. No evidence of pulmonary embolism. Normal heart size. Mild coronary artery atherosclerotic calcifications. No pericardial effusion. Mediastinum/Nodes: No enlarged mediastinal, hilar, or axillary lymph nodes. Thyroid gland, trachea, and esophagus demonstrate no significant findings. Lungs/Pleura: Lungs are clear. No pleural effusion or pneumothorax. Upper Abdomen: Spleen is enlarged measuring 15 cm in craniocaudal dimension. No acute abdominal process. Musculoskeletal: No chest wall abnormality. No acute or significant osseous findings. Review of the MIP images confirms the above findings. IMPRESSION: 1. No evidence of pulmonary embolism. 2. No acute cardiopulmonary process. 3. Splenomegaly. Electronically Signed   By: Keane Police D.O.   On: 04/27/2021 14:52   CARDIAC CATHETERIZATION  Result Date: 04/27/2021   Prox LAD lesion is 35% stenosed.    1st Diag lesion is 50% stenosed.   Mid Cx to Dist Cx lesion is 20% stenosed.   Mid RCA lesion is 100% stenosed.   A drug-eluting stent was successfully placed using a STENT ONYX FRONTIER 3.5X22.   Post intervention, there is a 0% residual stenosis.   There is mild left ventricular systolic dysfunction.   LV end diastolic pressure is normal.   The left ventricular ejection fraction is 45-50% by visual estimate. 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 Mild LV dysfunction Normal LVEDP Successful PCI of the mid RCA with DES x 1 Plan: DAPT for one year. May be on fast track for DC depending on clinical course.   ECHOCARDIOGRAM COMPLETE  Result Date: 04/28/2021    ECHOCARDIOGRAM REPORT   Patient Name:   Leroy Campbell. Date of Exam: 04/28/2021 Medical Rec #:  [id number]           Height:       72.0 in Accession #:    [id number]          Weight:       250.0 lb Date of Birth:  01/14/1961           BSA:          2.343 m Patient Age:    34 years            BP:           132/88 mmHg Patient Gender: M                   HR:           69 bpm. Exam Location:  Inpatient Procedure: 2D Echo, Cardiac Doppler and Color Doppler Indications:    CAD, NSTEMI  History:        Patient has no prior history of Echocardiogram examinations. CAD                 and Acute MI; Abnormal ECG.  Sonographer:    Glo Herring Referring Phys: [id number] PETER M [place] IMPRESSIONS  1. Left ventricular ejection fraction, by estimation, is 45 to 50%. The left ventricle has mildly decreased function. The left ventricle demonstrates regional wall motion abnormalities. Inferior hypokinesis. There is mild left ventricular hypertrophy. Left ventricular diastolic parameters are consistent with Grade I diastolic dysfunction (impaired relaxation).  2. Right ventricular systolic function is mildly reduced. The right ventricular size is normal. Tricuspid regurgitation signal is inadequate for assessing PA pressure.  3. The mitral  valve is normal in structure. No evidence of mitral valve regurgitation.  No evidence of mitral stenosis.  4. The aortic valve was not well visualized. Aortic valve regurgitation is not visualized. No aortic stenosis is present.  5. The inferior vena cava is normal in size with greater than 50% respiratory variability, suggesting right atrial pressure of 3 mmHg. FINDINGS  Left Ventricle: Left ventricular ejection fraction, by estimation, is 45 to 50%. The left ventricle has mildly decreased function. The left ventricle demonstrates regional wall motion abnormalities. The left ventricular internal cavity size was normal in size. There is mild left ventricular hypertrophy. Left ventricular diastolic parameters are consistent with Grade I diastolic dysfunction (impaired relaxation). Right Ventricle: The right ventricular size is normal. Right vetricular wall thickness was not well visualized. Right ventricular systolic function is mildly reduced. Tricuspid regurgitation signal is inadequate for assessing PA pressure. Left Atrium: Left atrial size was normal in size. Right Atrium: Right atrial size was normal in size. Pericardium: There is no evidence of pericardial effusion. Mitral Valve: The mitral valve is normal in structure. No evidence of mitral valve regurgitation. No evidence of mitral valve stenosis. Tricuspid Valve: The tricuspid valve is normal in structure. Tricuspid valve regurgitation is trivial. Aortic Valve: The aortic valve was not well visualized. Aortic valve regurgitation is not visualized. No aortic stenosis is present. Aortic valve mean gradient measures 3.0 mmHg. Aortic valve peak gradient measures 6.1 mmHg. Aortic valve area, by VTI measures 2.80 cm. Pulmonic Valve: The pulmonic valve was not well visualized. Pulmonic valve regurgitation is not visualized. Aorta: The aortic root and ascending aorta are structurally normal, with no evidence of dilitation. Venous: The inferior vena cava is normal  in size with greater than 50% respiratory variability, suggesting right atrial pressure of 3 mmHg. IAS/Shunts: The interatrial septum was not well visualized.  LEFT VENTRICLE PLAX 2D LVIDd:         4.50 cm      Diastology LVIDs:         3.20 cm      LV e' medial:    6.09 cm/s LV PW:         1.40 cm      LV E/e' medial:  10.1 LV IVS:        1.40 cm      LV e' lateral:   8.49 cm/s LVOT diam:     2.20 cm      LV E/e' lateral: 7.2 LV SV:         70 LV SV Index:   30 LVOT Area:     3.80 cm  LV Volumes (MOD) LV vol d, MOD A2C: 68.1 ml LV vol d, MOD A4C: 118.0 ml LV vol s, MOD A2C: 27.5 ml LV vol s, MOD A4C: 50.7 ml LV SV MOD A2C:     40.6 ml LV SV MOD A4C:     118.0 ml LV SV MOD BP:      53.3 ml RIGHT VENTRICLE RV Basal diam:  3.80 cm RV Mid diam:    2.60 cm RV S prime:     10.00 cm/s LEFT ATRIUM             Index        RIGHT ATRIUM           Index LA diam:        3.70 cm 1.58 cm/m   RA Area:     16.80 cm LA Vol (A2C):   29.5 ml 12.59 ml/m  RA Volume:   42.40 ml  18.10 ml/m  LA Vol (A4C):   46.8 ml 19.98 ml/m LA Biplane Vol: 39.4 ml 16.82 ml/m  AORTIC VALVE                    PULMONIC VALVE AV Area (Vmax):    2.78 cm     PV Vmax:       1.02 m/s AV Area (Vmean):   2.88 cm     PV Peak grad:  4.2 mmHg AV Area (VTI):     2.80 cm AV Vmax:           123.00 cm/s AV Vmean:          83.200 cm/s AV VTI:            0.250 m AV Peak Grad:      6.1 mmHg AV Mean Grad:      3.0 mmHg LVOT Vmax:         90.00 cm/s LVOT Vmean:        63.000 cm/s LVOT VTI:          0.184 m LVOT/AV VTI ratio: 0.74  AORTA Ao Root diam: 3.60 cm Ao Asc diam:  3.40 cm MITRAL VALVE MV Area (PHT): 3.28 cm    SHUNTS MV Decel Time: 231 msec    Systemic VTI:  0.18 m MV E velocity: 61.30 cm/s  Systemic Diam: 2.20 cm MV A velocity: 75.00 cm/s MV E/A ratio:  0.82 Oswaldo Milian MD Electronically signed by Oswaldo Milian MD Signature Date/Time: 04/28/2021/1:51:03 PM    Final    Disposition   Pt is being discharged home today in good  condition.  Follow-up Plans & Appointments     Follow-up Information     [name], MD Follow up.   Specialty: Cardiology Why: The office will call Contact information: [address] [place] [address] [phone]                Discharge Instructions     Amb Referral to Cardiac Rehabilitation   Complete by: As directed    Diagnosis:  Coronary Stents NSTEMI     After initial evaluation and assessments completed: Virtual Based Care may be provided alone or in conjunction with Phase 2 Cardiac Rehab based on patient barriers.: Yes   Diet - low sodium heart healthy   Complete by: As directed    Increase activity slowly   Complete by: As directed        Discharge Medications   Allergies as of 04/29/2021       Reactions   Bee Venom         Medication List     STOP taking these medications    indomethacin 50 MG capsule Commonly known as: INDOCIN       TAKE these medications    allopurinol 100 MG tablet Commonly known as: ZYLOPRIM Take 200 mg by mouth 2 (two) times daily.   aspirin 81 MG chewable tablet Chew 1 tablet (81 mg total) by mouth daily. Start taking on: April 30, 2021   atorvastatin 80 MG tablet Commonly known as: LIPITOR Take 1 tablet (80 mg total) by mouth daily. Start taking on: April 30, 2021   losartan 25 MG tablet Commonly known as: COZAAR Take 1 tablet (25 mg total) by mouth daily. Start taking on: April 30, 2021   metoprolol succinate 25 MG 24 hr tablet Commonly known as: TOPROL-XL Take 1 tablet (25 mg total) by mouth daily. Start taking on: April 30, 2021  naproxen sodium 220 MG tablet Commonly known as: ALEVE Take 220 mg by mouth as needed (headache).   nitroGLYCERIN 0.4 MG SL tablet Commonly known as: NITROSTAT Place 1 tablet (0.4 mg total) under the tongue every 5 (five) minutes x 3 doses as needed for chest pain.   ticagrelor 90 MG Tabs tablet Commonly known as:  BRILINTA Take 1 tablet (90 mg total) by mouth 2 (two) times daily.           Outstanding Labs/Studies   None  Duration of Discharge Encounter   Greater than 30 minutes including physician time.  Signed, Rosaria Ferries, PA-C 04/29/2021, 11:14 AM

## 2021-04-29 NOTE — Progress Notes (Signed)
Pt being d/c, IV removed, education complete.   Chrisandra Carota, RN 04/29/2021 11:33 AM

## 2021-04-29 NOTE — Progress Notes (Signed)
° ° °  WEIGH DAILY, every am, wearing the same amount of clothing Record weights, and bring it to office appointments Contact [name], MD for weight gain of 3 lbs in a day or 5 lbs in a week Limit sodium to 500 mg per meal, total 2000 mg per day Limit all liquids to 1.5-2 liters/quarts per day  Rosaria Ferries, PA-C 04/29/2021 11:42 AM

## 2021-04-29 NOTE — Progress Notes (Signed)
Cardiology Rounding Note   Subjective:     Denies CP or SOB.  BP 125/85.   Objective:   Weight Range:  Vital Signs:   Temp:  [98.3 F (36.8 C)-98.7 F (37.1 C)] 98.4 F (36.9 C) (12/25 0807) Pulse Rate:  [65-80] 80 (12/25 0313) Resp:  [16-26] 17 (12/25 0313) BP: (91-147)/(64-89) 125/85 (12/25 0807) SpO2:  [96 %-99 %] 97 % (12/25 0313) Weight:  [102.5 kg] 102.5 kg (12/24 2108) Last BM Date: 04/28/21  Weight change: Filed Weights   04/27/21 1236 04/28/21 2108  Weight: 113.4 kg 102.5 kg    Intake/Output:   Intake/Output Summary (Last 24 hours) at 04/29/2021 1001 Last data filed at 04/28/2021 1600 Gross per 24 hour  Intake 600 ml  Output --  Net 600 ml      Physical Exam: General:  Well appearing. No resp difficulty HEENT: normal Neck: supple. JVP . Carotids 2+ bilat; no bruits. No lymphadenopathy or thryomegaly appreciated. Cor: PMI nondisplaced. Regular rate & rhythm. No rubs, gallops or murmurs. Lungs: clear Abdomen: soft, nontender, nondistended. No hepatosplenomegaly. No bruits or masses. Good bowel sounds. Extremities: no cyanosis, clubbing, rash, edema Neuro: alert & orientedx3, cranial nerves grossly intact. moves all 4 extremities w/o difficulty. Affect pleasant  Telemetry: NSR 70s Personally reviewed  Labs: Basic Metabolic Panel: Recent Labs  Lab 04/27/21 1245 04/27/21 2004 04/28/21 0113  NA 138  --  137  K 4.2  --  4.0  CL 103  --  100  CO2 25  --  28  GLUCOSE 116*  --  156*  BUN 13  --  10  CREATININE 0.95 1.07 1.11  CALCIUM 9.6  --  9.4     Liver Function Tests: No results for input(s): AST, ALT, ALKPHOS, BILITOT, PROT, ALBUMIN in the last 168 hours. No results for input(s): LIPASE, AMYLASE in the last 168 hours. No results for input(s): AMMONIA in the last 168 hours.  CBC: Recent Labs  Lab 04/27/21 1245 04/27/21 2004 04/28/21 0113  WBC 6.5 7.0 7.6  HGB 16.3 16.3 15.6  HCT 47.5 48.0 46.1  MCV 85.7 87.9 87.8  PLT 170  157 158     Cardiac Enzymes: No results for input(s): CKTOTAL, CKMB, CKMBINDEX, TROPONINI in the last 168 hours.  BNP: BNP (last 3 results) Recent Labs    04/27/21 1450  BNP 52.5     ProBNP (last 3 results) No results for input(s): PROBNP in the last 8760 hours.    Other results:  Imaging: DG Chest 2 View  Result Date: 04/27/2021 CLINICAL DATA:  Pt presents from PCP for CP and EKG re-evaluation. Pt states CP has been intermittent for three days, worsening today. Pt also endorses SOB. EXAM: CHEST - 2 VIEW COMPARISON:  10/13/2013 FINDINGS: Lungs are clear. Heart size and mediastinal contours are within normal limits. No effusion. Visualized bones unremarkable. IMPRESSION: No acute cardiopulmonary disease. Electronically Signed   By: Lucrezia Europe M.D.   On: 04/27/2021 12:56   CT Angio Chest PE W and/or Wo Contrast  Result Date: 04/27/2021 CLINICAL DATA:  Intermittent chest pain for 3 days. EXAM: CT ANGIOGRAPHY CHEST WITH CONTRAST TECHNIQUE: Multidetector CT imaging of the chest was performed using the standard protocol during bolus administration of intravenous contrast. Multiplanar CT image reconstructions and MIPs were obtained to evaluate the vascular anatomy. CONTRAST:  27mL OMNIPAQUE IOHEXOL 350 MG/ML SOLN COMPARISON:  None. FINDINGS: Cardiovascular: Satisfactory opacification of the pulmonary arteries to the segmental level. No evidence of pulmonary  embolism. Normal heart size. Mild coronary artery atherosclerotic calcifications. No pericardial effusion. Mediastinum/Nodes: No enlarged mediastinal, hilar, or axillary lymph nodes. Thyroid gland, trachea, and esophagus demonstrate no significant findings. Lungs/Pleura: Lungs are clear. No pleural effusion or pneumothorax. Upper Abdomen: Spleen is enlarged measuring 15 cm in craniocaudal dimension. No acute abdominal process. Musculoskeletal: No chest wall abnormality. No acute or significant osseous findings. Review of the MIP images  confirms the above findings. IMPRESSION: 1. No evidence of pulmonary embolism. 2. No acute cardiopulmonary process. 3. Splenomegaly. Electronically Signed   By: Keane Police D.O.   On: 04/27/2021 14:52   CARDIAC CATHETERIZATION  Result Date: 04/27/2021   Prox LAD lesion is 35% stenosed.   1st Diag lesion is 50% stenosed.   Mid Cx to Dist Cx lesion is 20% stenosed.   Mid RCA lesion is 100% stenosed.   A drug-eluting stent was successfully placed using a STENT ONYX FRONTIER 3.5X22.   Post intervention, there is a 0% residual stenosis.   There is mild left ventricular systolic dysfunction.   LV end diastolic pressure is normal.   The left ventricular ejection fraction is 45-50% by visual estimate. 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 Mild LV dysfunction Normal LVEDP Successful PCI of the mid RCA with DES x 1 Plan: DAPT for one year. May be on fast track for DC depending on clinical course.   ECHOCARDIOGRAM COMPLETE  Result Date: 04/28/2021    ECHOCARDIOGRAM REPORT   Patient Name:   Leroy Campbell. Date of Exam: 04/28/2021 Medical Rec #:  [id number]           Height:       72.0 in Accession #:    [id number]          Weight:       250.0 lb Date of Birth:  08/20/1960           BSA:          2.343 m Patient Age:    60 years            BP:           132/88 mmHg Patient Gender: M                   HR:           69 bpm. Exam Location:  Inpatient Procedure: 2D Echo, Cardiac Doppler and Color Doppler Indications:    CAD, NSTEMI  History:        Patient has no prior history of Echocardiogram examinations. CAD                 and Acute MI; Abnormal ECG.  Sonographer:    Glo Herring Referring Phys: [id number] PETER M [place] IMPRESSIONS  1. Left ventricular ejection fraction, by estimation, is 45 to 50%. The left ventricle has mildly decreased function. The left ventricle demonstrates regional wall motion abnormalities. Inferior hypokinesis. There is mild left ventricular hypertrophy. Left  ventricular diastolic parameters are consistent with Grade I diastolic dysfunction (impaired relaxation).  2. Right ventricular systolic function is mildly reduced. The right ventricular size is normal. Tricuspid regurgitation signal is inadequate for assessing PA pressure.  3. The mitral valve is normal in structure. No evidence of mitral valve regurgitation. No evidence of mitral stenosis.  4. The aortic valve was not well visualized. Aortic valve regurgitation is not visualized. No aortic stenosis is present.  5. The inferior vena cava is normal in  size with greater than 50% respiratory variability, suggesting right atrial pressure of 3 mmHg. FINDINGS  Left Ventricle: Left ventricular ejection fraction, by estimation, is 45 to 50%. The left ventricle has mildly decreased function. The left ventricle demonstrates regional wall motion abnormalities. The left ventricular internal cavity size was normal in size. There is mild left ventricular hypertrophy. Left ventricular diastolic parameters are consistent with Grade I diastolic dysfunction (impaired relaxation). Right Ventricle: The right ventricular size is normal. Right vetricular wall thickness was not well visualized. Right ventricular systolic function is mildly reduced. Tricuspid regurgitation signal is inadequate for assessing PA pressure. Left Atrium: Left atrial size was normal in size. Right Atrium: Right atrial size was normal in size. Pericardium: There is no evidence of pericardial effusion. Mitral Valve: The mitral valve is normal in structure. No evidence of mitral valve regurgitation. No evidence of mitral valve stenosis. Tricuspid Valve: The tricuspid valve is normal in structure. Tricuspid valve regurgitation is trivial. Aortic Valve: The aortic valve was not well visualized. Aortic valve regurgitation is not visualized. No aortic stenosis is present. Aortic valve mean gradient measures 3.0 mmHg. Aortic valve peak gradient measures 6.1 mmHg.  Aortic valve area, by VTI measures 2.80 cm. Pulmonic Valve: The pulmonic valve was not well visualized. Pulmonic valve regurgitation is not visualized. Aorta: The aortic root and ascending aorta are structurally normal, with no evidence of dilitation. Venous: The inferior vena cava is normal in size with greater than 50% respiratory variability, suggesting right atrial pressure of 3 mmHg. IAS/Shunts: The interatrial septum was not well visualized.  LEFT VENTRICLE PLAX 2D LVIDd:         4.50 cm      Diastology LVIDs:         3.20 cm      LV e' medial:    6.09 cm/s LV PW:         1.40 cm      LV E/e' medial:  10.1 LV IVS:        1.40 cm      LV e' lateral:   8.49 cm/s LVOT diam:     2.20 cm      LV E/e' lateral: 7.2 LV SV:         70 LV SV Index:   30 LVOT Area:     3.80 cm  LV Volumes (MOD) LV vol d, MOD A2C: 68.1 ml LV vol d, MOD A4C: 118.0 ml LV vol s, MOD A2C: 27.5 ml LV vol s, MOD A4C: 50.7 ml LV SV MOD A2C:     40.6 ml LV SV MOD A4C:     118.0 ml LV SV MOD BP:      53.3 ml RIGHT VENTRICLE RV Basal diam:  3.80 cm RV Mid diam:    2.60 cm RV S prime:     10.00 cm/s LEFT ATRIUM             Index        RIGHT ATRIUM           Index LA diam:        3.70 cm 1.58 cm/m   RA Area:     16.80 cm LA Vol (A2C):   29.5 ml 12.59 ml/m  RA Volume:   42.40 ml  18.10 ml/m LA Vol (A4C):   46.8 ml 19.98 ml/m LA Biplane Vol: 39.4 ml 16.82 ml/m  AORTIC VALVE  PULMONIC VALVE AV Area (Vmax):    2.78 cm     PV Vmax:       1.02 m/s AV Area (Vmean):   2.88 cm     PV Peak grad:  4.2 mmHg AV Area (VTI):     2.80 cm AV Vmax:           123.00 cm/s AV Vmean:          83.200 cm/s AV VTI:            0.250 m AV Peak Grad:      6.1 mmHg AV Mean Grad:      3.0 mmHg LVOT Vmax:         90.00 cm/s LVOT Vmean:        63.000 cm/s LVOT VTI:          0.184 m LVOT/AV VTI ratio: 0.74  AORTA Ao Root diam: 3.60 cm Ao Asc diam:  3.40 cm MITRAL VALVE MV Area (PHT): 3.28 cm    SHUNTS MV Decel Time: 231 msec    Systemic VTI:  0.18 m MV  E velocity: 61.30 cm/s  Systemic Diam: 2.20 cm MV A velocity: 75.00 cm/s MV E/A ratio:  0.82 Oswaldo Milian MD Electronically signed by Oswaldo Milian MD Signature Date/Time: 04/28/2021/1:51:03 PM    Final      Medications:     Scheduled Medications:  aspirin  81 mg Oral Daily   atorvastatin  80 mg Oral Daily   Chlorhexidine Gluconate Cloth  6 each Topical Q0600   enoxaparin (LOVENOX) injection  40 mg Subcutaneous Q24H   losartan  25 mg Oral Daily   metoprolol succinate  25 mg Oral Daily   sodium chloride flush  3 mL Intravenous Q12H   ticagrelor  90 mg Oral BID    Infusions:  sodium chloride      PRN Medications: sodium chloride, acetaminophen, nitroGLYCERIN, ondansetron (ZOFRAN) IV, simethicone, sodium chloride flush   Assessment/Plan:   1. CAD with acute inferior STEMI - cath 12/23 LAD 35%, LCX 20%, RCA  m100% -> PCI/DES to RCA - hstrop 8,346 - now pain free  - EF 45-50% on cath - Echo EF 45-50% - Continue DAPT/b-blocker/statin    2. Hyperlipidemia - LDL 134 - on high potency statin  3. Ischemic CM - echo as above - started losartan 25 mg daily, Toprol-XL 25 mg daily  4. Elevated blood glucose - hgbA1c 5.4  Disposition: Okay for discharge today.  Will discharge on aspirin 81 mg daily, ticagrelor 90 mg twice daily, atorvastatin 80 mg daily, losartan 25 mg daily, Toprol-XL 25 mg daily.  Will schedule follow-up  Length of Stay: 2   Donato Heinz MD 04/29/2021, 10:01 AM

## 2021-04-29 NOTE — Discharge Instructions (Addendum)
WEIGH DAILY, every am, wearing the same amount of clothing Record weights, and bring it to office appointments Contact [name], MD for weight gain of 3 lbs in a day or 5 lbs in a week Limit sodium to 500 mg per meal, total 2000 mg per day Limit all liquids to 1.5-2 liters/quarts per day  PLEASE REMEMBER TO BRING ALL OF YOUR MEDICATIONS TO EACH OF YOUR FOLLOW-UP OFFICE VISITS.  PLEASE ATTEND ALL SCHEDULED FOLLOW-UP APPOINTMENTS.   Activity: Increase activity slowly as tolerated. You may shower, but no soaking baths (or swimming) for 1 week. No driving for 1 week. No lifting over 5 lbs for 2 weeks. No sexual activity for 1 week.   You May Return to Work: in 3 weeks (if applicable)  Wound Care: You may wash cath site gently with soap and water. Keep cath site clean and dry. If you notice pain, swelling, bleeding or pus at your cath site, please call [phone].    Cardiac Cath Site Care Refer to this sheet in the next few weeks. These instructions provide you with information on caring for yourself after your procedure. Your caregiver may also give you more specific instructions. Your treatment has been planned according to current medical practices, but problems sometimes occur. Call your caregiver if you have any problems or questions after your procedure. HOME CARE INSTRUCTIONS You may shower 24 hours after the procedure. Remove the bandage (dressing) and gently wash the site with plain soap and water. Gently pat the site dry.  Do not apply powder or lotion to the site.  Do not sit in a bathtub, swimming pool, or whirlpool for 5 to 7 days.  No bending, squatting, or lifting anything over 10 pounds (4.5 kg) as directed by your caregiver.  Inspect the site at least twice daily.  Do not drive home if you are discharged the same day of the procedure. Have someone else drive you.  You may drive 24 hours after the procedure unless otherwise instructed by your caregiver.  What to  expect: Any bruising will usually fade within 1 to 2 weeks.  Blood that collects in the tissue (hematoma) may be painful to the touch. It should usually decrease in size and tenderness within 1 to 2 weeks.  SEEK IMMEDIATE MEDICAL CARE IF: You have unusual pain at the site or down the affected limb.  You have redness, warmth, swelling, or pain at the site.  You have drainage (other than a small amount of blood on the dressing).  You have chills.  You have a fever or persistent symptoms for more than 72 hours.  You have a fever and your symptoms suddenly get worse.  Your leg becomes pale, cool, tingly, or numb.  You have heavy bleeding from the site. Hold pressure on the site.  Document Released: 05/25/2010 Document Revised: 04/11/2011 Document Reviewed:

## 2021-05-01 MED FILL — Nitroglycerin IV Soln 100 MCG/ML in D5W: INTRA_ARTERIAL | Qty: 10 | Status: AC

## 2021-05-02 ENCOUNTER — Telehealth: Payer: Self-pay | Admitting: Cardiology

## 2021-05-02 NOTE — Telephone Encounter (Signed)
Leroy Campbell from case manager with [employer] calling on behalf of the patient requesting the office reach out to the patient about getting set up with a nutritionist or care guide. She would like the patient to receive the call back.

## 2021-05-03 ENCOUNTER — Other Ambulatory Visit: Payer: Self-pay

## 2021-05-03 DIAGNOSIS — E785 Hyperlipidemia, unspecified: Secondary | ICD-10-CM

## 2021-05-03 DIAGNOSIS — E669 Obesity, unspecified: Secondary | ICD-10-CM

## 2021-05-03 NOTE — Telephone Encounter (Signed)
Spoke with patient to let him know I ordered a nutrition consult for heart healthy diet advice post MI. I gave him the number to call in 2-3 weeks if he hasn't heard from them [phone]). He then asked about cardiac rehab. I suggested he wait until he sees Dr. Percival Spanish in January. Patient voiced understanding of this conversation.

## 2021-05-06 ENCOUNTER — Other Ambulatory Visit: Payer: Self-pay

## 2021-05-06 ENCOUNTER — Encounter (HOSPITAL_BASED_OUTPATIENT_CLINIC_OR_DEPARTMENT_OTHER): Payer: Self-pay | Admitting: *Deleted

## 2021-05-06 ENCOUNTER — Emergency Department (HOSPITAL_BASED_OUTPATIENT_CLINIC_OR_DEPARTMENT_OTHER)
Admission: EM | Admit: 2021-05-06 | Discharge: 2021-05-06 | Disposition: A | Discharge status: Home / Self Care | Payer: No Typology Code available for payment source | Attending: Emergency Medicine | Admitting: Emergency Medicine

## 2021-05-06 ENCOUNTER — Emergency Department (HOSPITAL_BASED_OUTPATIENT_CLINIC_OR_DEPARTMENT_OTHER): Payer: No Typology Code available for payment source

## 2021-05-06 DIAGNOSIS — R0602 Shortness of breath: Secondary | ICD-10-CM | POA: Insufficient documentation

## 2021-05-06 DIAGNOSIS — T7840XA Allergy, unspecified, initial encounter: Secondary | ICD-10-CM | POA: Insufficient documentation

## 2021-05-06 DIAGNOSIS — R0789 Other chest pain: Secondary | ICD-10-CM | POA: Diagnosis not present

## 2021-05-06 DIAGNOSIS — Z7982 Long term (current) use of aspirin: Secondary | ICD-10-CM | POA: Insufficient documentation

## 2021-05-06 LAB — CBC WITH DIFFERENTIAL/PLATELET
Abs Immature Granulocytes: 0.07 10*3/uL (ref 0.00–0.07)
Basophils Absolute: 0 10*3/uL (ref 0.0–0.1)
Basophils Relative: 0 %
Eosinophils Absolute: 0.4 10*3/uL (ref 0.0–0.5)
Eosinophils Relative: 4 %
HCT: 47.3 % (ref 39.0–52.0)
Hemoglobin: 17.1 g/dL — ABNORMAL HIGH (ref 13.0–17.0)
Immature Granulocytes: 1 %
Lymphocytes Relative: 9 %
Lymphs Abs: 0.9 10*3/uL (ref 0.7–4.0)
MCH: 30.7 pg (ref 26.0–34.0)
MCHC: 36.2 g/dL — ABNORMAL HIGH (ref 30.0–36.0)
MCV: 84.9 fL (ref 80.0–100.0)
Monocytes Absolute: 1.3 10*3/uL — ABNORMAL HIGH (ref 0.1–1.0)
Monocytes Relative: 13 %
Neutro Abs: 7.4 10*3/uL (ref 1.7–7.7)
Neutrophils Relative %: 73 %
Platelets: 218 10*3/uL (ref 150–400)
RBC: 5.57 MIL/uL (ref 4.22–5.81)
RDW: 12.9 % (ref 11.5–15.5)
WBC: 10.1 10*3/uL (ref 4.0–10.5)
nRBC: 0 % (ref 0.0–0.2)

## 2021-05-06 LAB — BASIC METABOLIC PANEL
Anion gap: 12 (ref 5–15)
BUN: 12 mg/dL (ref 6–20)
CO2: 21 mmol/L — ABNORMAL LOW (ref 22–32)
Calcium: 9.3 mg/dL (ref 8.9–10.3)
Chloride: 100 mmol/L (ref 98–111)
Creatinine, Ser: 1.02 mg/dL (ref 0.61–1.24)
GFR, Estimated: >60 mL/min (ref >60)
Glucose, Bld: 122 mg/dL — ABNORMAL HIGH (ref 70–99)
Potassium: 4.1 mmol/L (ref 3.5–5.1)
Sodium: 133 mmol/L — ABNORMAL LOW (ref 135–145)

## 2021-05-06 LAB — BRAIN NATRIURETIC PEPTIDE: B Natriuretic Peptide: 14.5 pg/mL (ref 0.0–100.0)

## 2021-05-06 LAB — TROPONIN I (HIGH SENSITIVITY): Troponin I (High Sensitivity): 23 ng/L — ABNORMAL HIGH (ref <18)

## 2021-05-06 MED ORDER — AMOXICILLIN-POT CLAVULANATE 875-125 MG PO TABS: 1.0000 | ORAL_TABLET | Freq: Two times a day (BID) | ORAL | 0 refills | Status: DC

## 2021-05-06 MED ORDER — FAMOTIDINE IN NACL 20-0.9 MG/50ML-% IV SOLN
20.0000 mg | Freq: Once | INTRAVENOUS | Status: AC
  Administered 2021-05-06: 20 mg via INTRAVENOUS
  Filled 2021-05-06: qty 50

## 2021-05-06 MED ORDER — METHYLPREDNISOLONE SODIUM SUCC 125 MG IJ SOLR
125.0000 mg | Freq: Once | INTRAMUSCULAR | Status: AC
  Administered 2021-05-06: 125 mg via INTRAVENOUS
  Filled 2021-05-06: qty 2

## 2021-05-06 MED ORDER — CLOPIDOGREL BISULFATE 75 MG PO TABS: 75.0000 mg | ORAL_TABLET | Freq: Every day | ORAL | 2 refills | Status: DC

## 2021-05-06 MED ORDER — DIPHENHYDRAMINE HCL 50 MG/ML IJ SOLN
25.0000 mg | Freq: Once | INTRAMUSCULAR | Status: AC
  Administered 2021-05-06: 25 mg via INTRAVENOUS
  Filled 2021-05-06: qty 1

## 2021-05-06 MED ORDER — SODIUM CHLORIDE 0.9 % IV SOLN: INTRAVENOUS | Status: DC | PRN

## 2021-05-06 NOTE — Discharge Instructions (Addendum)
You were seen in the emergency department for rash chest tightness and tongue swelling.  This is likely an allergic reaction.  You can use Benadryl 1 tablet every 6 hours as needed.  Cardiology wants you to stop your losartan and Brilinta.  Start Plavix tomorrow 2 tablets and then after that 1 tablet daily.  We are also starting you on antibiotics for possible sinus infection.  Please drink plenty of fluids.  Return to the emergency department if any worsening or concerning symptoms.

## 2021-05-06 NOTE — ED Triage Notes (Signed)
Pt had MI and stent placement on 12/23 and has been having increasing itching since 12/27 and then today he noted a rash which has been increasing since last pm.  Pt also noted some swelling in his mouth since yesterday.  Pt has new medications which he began on 12/23 (nitroglycerin, Heparin, Aspirin, atorvastatin, losartan, metoprolol, brilinta)

## 2021-05-06 NOTE — ED Provider Notes (Signed)
[facility] EMERGENCY DEPARTMENT Provider Note   CSN: [id number] Arrival date & time: 05/06/21  1117     History  Chief Complaint  Patient presents with   Shortness of Breath   Allergic Reaction    Leroy Campbell. is a 61 y.o. male.  He was recently in the hospital with an NSTEMI and had a stent in his RCA.  Started on multiple new medications.  He was home and doing well for a couple of days but for the last 3 days he has had some itching and rash.  Since last evening he has felt a little tightness in his chest and this morning he noticed he had some swelling underneath his tongue.  He is also noticed a few hives.  He denies any chest pain.  No fevers or chills.  Tried nothing for this reaction.  New medications include atorvastatin losartan metoprolol Brilinta and aspirin.  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  The history is provided by the patient and the spouse.  Allergic Reaction Presenting symptoms: difficulty breathing, itching and rash   Difficulty breathing:    Severity:  Mild   Onset quality:  Gradual   Duration:  2 days   Timing:  Constant   Progression:  Unchanged Itching:    Location:  Full body   Severity:  Moderate   Onset quality:  Gradual   Duration:  4 days   Timing:  Constant   Progression:  Unchanged Severity:  Moderate Duration:  4 days Prior allergic episodes:  Unable to specify Context: medications   Context: not new detergents/soaps   Relieved by:  None tried Worsened by:  Nothing Ineffective treatments:  None tried     Home Medications Prior to Admission medications   Medication Sig Start Date End Date Taking? Authorizing Provider  allopurinol (ZYLOPRIM) 100 MG tablet Take 200 mg by mouth 2 (two) times daily. 04/09/21   [provider]  aspirin 81 MG chewable tablet Chew 1 tablet (81 mg total)  by mouth daily. 04/30/21   Barrett, Evelene Croon, PA-C  atorvastatin (LIPITOR) 80 MG tablet Take 1 tablet (80 mg total) by mouth daily. 04/30/21   Barrett, Evelene Croon, PA-C  losartan (COZAAR) 25 MG tablet Take 1 tablet (25 mg total) by mouth daily. 04/30/21   Barrett, Evelene Croon, PA-C  metoprolol succinate (TOPROL-XL) 25 MG 24 hr tablet Take 1 tablet (25 mg total) by mouth daily. 04/30/21   Barrett, Evelene Croon, PA-C  naproxen sodium (ALEVE) 220 MG tablet Take 220 mg by mouth as needed (headache).    [provider]  nitroGLYCERIN (NITROSTAT) 0.4 MG SL tablet Place 1 tablet (0.4 mg total) under the tongue every 5 (five) minutes x 3 doses as needed for chest pain. 04/29/21   Barrett, Evelene Croon, PA-C  ticagrelor (BRILINTA) 90 MG TABS tablet Take 1 tablet (90 mg total) by mouth 2 (two) times daily. 04/29/21   Barrett, Evelene Croon, PA-C      Allergies    Bee venom    Review of Systems   Review of Systems  Constitutional:  Negative for fever.  HENT:  Negative for sore throat.   Eyes:  Negative for visual disturbance.  Respiratory:  Positive for chest tightness and shortness of breath.   Cardiovascular:  Negative for chest pain.  Gastrointestinal:  Negative for abdominal  pain.  Genitourinary:  Negative for dysuria.  Musculoskeletal:  Negative for neck pain.  Skin:  Positive for itching and rash.  Neurological:  Negative for headaches.   Physical Exam Updated Vital Signs BP 121/72 (BP Location: Left Arm)    Pulse 89    Temp 99.6 F (37.6 C) (Oral)    Resp 16    Wt 99.8 kg    SpO2 97%    BMI 29.84 kg/m  Physical Exam Vitals and nursing note reviewed.  Constitutional:      General: He is not in acute distress.    Appearance: He is well-developed.  HENT:     Head: Normocephalic and atraumatic.     Mouth/Throat:     Mouth: Mucous membranes are moist.     Pharynx: Oropharynx is clear. No oropharyngeal exudate or posterior oropharyngeal erythema.  Eyes:     Conjunctiva/sclera: Conjunctivae  normal.  Cardiovascular:     Rate and Rhythm: Normal rate and regular rhythm.     Heart sounds: No murmur heard. Pulmonary:     Effort: Pulmonary effort is normal. No respiratory distress.     Breath sounds: Normal breath sounds.  Abdominal:     Palpations: Abdomen is soft.     Tenderness: There is no abdominal tenderness. There is no guarding or rebound.  Musculoskeletal:        General: No swelling.     Cervical back: Neck supple.     Comments: Bruising and induration to right wrist where he had his cardiac cath.  Skin:    General: Skin is warm and dry.     Capillary Refill: Capillary refill takes less than 2 seconds.     Findings: Rash present.     Comments: He has a rash of small red dots generalized over trunk arms and legs.  Neurological:     General: No focal deficit present.     Mental Status: He is alert.     Gait: Gait normal.  Psychiatric:        Mood and Affect: Mood normal.    ED Results / Procedures / Treatments   Labs (all labs ordered are listed, but only abnormal results are displayed) Labs Reviewed  BASIC METABOLIC PANEL - Abnormal; Notable for the following components:      Result Value   Sodium 133 (*)    CO2 21 (*)    Glucose, Bld 122 (*)    All other components within normal limits  CBC WITH DIFFERENTIAL/PLATELET - Abnormal; Notable for the following components:   Hemoglobin 17.1 (*)    MCHC 36.2 (*)    Monocytes Absolute 1.3 (*)    All other components within normal limits  TROPONIN I (HIGH SENSITIVITY) - Abnormal; Notable for the following components:   Troponin I (High Sensitivity) 23 (*)    All other components within normal limits  BRAIN NATRIURETIC PEPTIDE  TROPONIN I (HIGH SENSITIVITY)    EKG EKG Interpretation  Date/Time:  Sunday May 06 2021 11:52:59 EST Ventricular Rate:  86 PR Interval:  136 QRS Duration: 132 QT Interval:  376 QTC Calculation: 449 R Axis:   -55 Text Interpretation: Normal sinus rhythm Left axis deviation  Right bundle branch block Possible Lateral infarct , age undetermined Inferior infarct , age undetermined Abnormal ECG When compared with ECG of 28-Apr-2021 07:24, new inferior Q waves Confirmed by Aletta Edouard [phone]) on 05/06/2021 11:56:46 AM  Radiology DG Chest Port 1 View  Result Date: 05/06/2021 CLINICAL DATA:  Shortness of  breath. EXAM: PORTABLE CHEST 1 VIEW COMPARISON:  December 23rd 2022 FINDINGS: The heart size and mediastinal contours are within normal limits. Both lungs are clear. The visualized skeletal structures are unremarkable. IMPRESSION: No active disease. Electronically Signed   By: Abelardo Diesel M.D.   On: 05/06/2021 12:48    Procedures Procedures    Medications Ordered in ED Medications  diphenhydrAMINE (BENADRYL) injection 25 mg (has no administration in time range)  methylPREDNISolone sodium succinate (SOLU-MEDROL) 125 mg/2 mL injection 125 mg (has no administration in time range)  famotidine (PEPCID) IVPB 20 mg premix (has no administration in time range)    ED Course/ Medical Decision Making/ A&P Clinical Course as of 05/06/21 1741  Sun May 06, 2021  1240 Chest x-ray interpreted by me as no acute infiltrates.  Awaiting radiology reading. [MB]  1249 EKG not crossing into epic.  Rate of 86 normal sinus rhythm right bundle branch block left axis deviation Q waves inferior new from prior EKG 1 week ago [MB]  1338 Discussed with: Cardiology Dr. Johnsie Cancel.  He feels the most likely culprits are the losartan and the Brilinta.  Recommend stopping both of those and loading with 150 mg of Plavix tomorrow and then take 75 daily.  He will also message Dr. Percival Spanish who was was to be following up with the patient.  Reviewed plan with patient.  He is concerned that he may be starting a sinus infection so wants me to start him on an antibiotic also. [MB]    Clinical Course User Index [MB] Hayden Rasmussen, MD                           Medical Decision Making  This patient  presents to the ED for concern of allergic reaction shortness of breath rash, this involves an extensive number of treatment options, and is a complaint that carries with it a high risk of complications and morbidity.  The differential diagnosis includes allergic reaction, CHF, ACS, airway compromise   Additional history obtained:  Additional history obtained from patient's wife External records from outside source obtained and reviewed including in epic including recent discharge summary   Lab Tests:  I Ordered, reviewed, and interpreted labs.  The pertinent results include: CBC with normal white count, hemoglobin similar to priors, chemistries with mildly low sodium and bicarb, BNP normal, troponin unremarkable do not feel needs delta   Imaging Studies ordered:  I ordered imaging studies including chest x-ray I independently visualized and interpreted imaging which showed no acute infiltrates no CHF I agree with the radiologist interpretation   Cardiac Monitoring:  The patient was maintained on a cardiac monitor.  I personally viewed and interpreted the cardiac monitored which showed an underlying rhythm of: Normal sinus rhythm   Medicines ordered and prescription drug management:  I ordered medication including prednisone Benadryl Pepcid for possible allergic reaction Reevaluation of the patient after these medicines showed that the patient improved I have reviewed the patients home medicines and have made adjustments as needed   Critical Interventions:  None   Consultations Obtained:  I requested consultation with the cardiology Dr. Johnsie Cancel,  and discussed lab and imaging findings as well as pertinent plan - they recommend: Stopping patient's Brilinta and losartan.  Start on Plavix tomorrow   Problem List / ED Course:     Reevaluation:  After the interventions noted above, I reevaluated the patient and found that they have :improved   Dispostion:  After  consideration of the diagnostic results and the patients response to treatment feel that the patent would benefit from close outpatient follow-up.  Patient in agreement with plan.  Return instructions discussed.          Final Clinical Impression(s) / ED Diagnoses Final diagnoses:  Allergic reaction, initial encounter    Rx / DC Orders ED Discharge Orders          Ordered    clopidogrel (PLAVIX) 75 MG tablet  Daily,   Status:  Discontinued        05/06/21 1341    amoxicillin-clavulanate (AUGMENTIN) 875-125 MG tablet  Every 12 hours,   Status:  Discontinued        05/06/21 1341    amoxicillin-clavulanate (AUGMENTIN) 875-125 MG tablet  Every 12 hours,   Status:  Discontinued        05/06/21 1356    clopidogrel (PLAVIX) 75 MG tablet  Daily,   Status:  Discontinued        05/06/21 1356    amoxicillin-clavulanate (AUGMENTIN) 875-125 MG tablet  Every 12 hours        05/06/21 1503    clopidogrel (PLAVIX) 75 MG tablet  Daily        05/06/21 1503              Hayden Rasmussen, MD 05/06/21 1744

## 2021-05-08 ENCOUNTER — Telehealth: Payer: Self-pay | Admitting: Cardiology

## 2021-05-08 NOTE — Telephone Encounter (Signed)
Yes, patient should be ok to stay on Plavix

## 2021-05-08 NOTE — Telephone Encounter (Signed)
Pt c/o medication issue:  1. Name of Medication: BRILINTA LARSARTAN  2. How are you currently taking this medication (dosage and times per day)? ED PROVIDER CHANGED THE ABOVE MEDS TO PLAVIX  3. Are you having a reaction (difficulty breathing--STAT)? RED WELPS, TIGHTNESS IN CHEST, SCRATCHY THROAT  4. What is your medication issue? PT WENT TO THE ED DUE TO AN ALLERGIC REACTION FROM EITHER OF THE ABOVE MEDS. PT IS NOT SURE WHICH MEDICINE  HE IS ALLERGIC TOO BUT THE ED PROVIDER REMOVED THE MEDS FROM THE PATIENT AND REPLACED IT WITH PLAVIX. PT WANTS TO KNOW IF HE SHOULD WAIT FOR HIS UPCOMING APPT WITH [facility] OR SHOULD HE BE SEEN SOONER. PLEASE ADVISE PT FURTHER

## 2021-05-08 NOTE — Telephone Encounter (Signed)
Spoke with pt, he reports the hives are going away and the SOB is better. He is wanting to make sure staying on the plavix is the correct thing to do. Aware because things are improving, I feel this is still wash out from the [place] but will confirm with dr hochrein and the pharm md. Pt agreed with this plan.

## 2021-05-10 NOTE — Telephone Encounter (Signed)
Spoke with pt, he reports that things seem to be improving and he is aware to continue on the plavix.

## 2021-05-24 DIAGNOSIS — I2510 Atherosclerotic heart disease of native coronary artery without angina pectoris: Secondary | ICD-10-CM | POA: Insufficient documentation

## 2021-05-24 NOTE — Progress Notes (Addendum)
Cardiology Office Note   Date:  05/25/2021   ID:  Leroy Savas., DOB Jan 06, 1961, MRN [id number]  PCP:  [name], PA  Cardiologist:   [name], MD   Chief Complaint  Patient presents with   Coronary Artery Disease      History of Present Illness: Leroy Campbell. is a 61 y.o. male who presents for evaluation of CAD.  He is status post PCI of an occluded RCA.  He had an EF of 45%.  This was in late December.  Since he was last seen he has had no new cardiovascular complaints.  He is not having any new chest pressure, neck or arm discomfort.  He has a vague sense of fullness in his chest occasionally but nothing like his previous angina.  He has not had any new palpitations, presyncope or syncope.  He has had no weight gain or edema.  He did have a rash and went to the emergency room and I reviewed these records.  He was actually told to stop his ACE inhibitor and his Brilinta was switched to Plavix.  He has been continuing the ACE inhibitor but he has been off the Brilinta and the Plavix in its place is not causing a rash.       Past Medical History:  Diagnosis Date   Gout    Hyperlipidemia    Ischemic cardiomyopathy    MI (myocardial infarction) ([facility]) 04/27/2021    Past Surgical History:  Procedure Laterality Date   CORONARY STENT INTERVENTION N/A 04/27/2021   Procedure: CORONARY STENT INTERVENTION;  Surgeon: Martinique, Peter M, MD;  Location: [facility];  Service: Cardiovascular;  Laterality: N/A;   KNEE ARTHROSCOPY Bilateral    x2 bilaterally   KNEE SURGERY Bilateral    x's 4-- 2 surgeries on both knees   LEFT AND RIGHT HEART CATHETERIZATION WITH CORONARY ANGIOGRAM N/A 10/19/2013   Procedure: LEFT AND RIGHT HEART CATHETERIZATION WITH CORONARY ANGIOGRAM;  Surgeon: Sinclair Grooms, MD;  Location: [facility];  Service: Cardiovascular;  Laterality: N/A;   LEFT HEART CATH AND CORONARY ANGIOGRAPHY N/A 04/27/2021   Procedure: LEFT HEART CATH AND  CORONARY ANGIOGRAPHY;  Surgeon: Martinique, Peter M, MD;  Location: [facility];  Service: Cardiovascular;  Laterality: N/A;   SHOULDER SURGERY Right 05/06/2009   TONSILLECTOMY AND ADENOIDECTOMY       Current Outpatient Medications  Medication Sig Dispense Refill   allopurinol (ZYLOPRIM) 100 MG tablet Take 200 mg by mouth 2 (two) times daily.     aspirin 81 MG chewable tablet Chew 1 tablet (81 mg total) by mouth daily.     naproxen sodium (ALEVE) 220 MG tablet Take 220 mg by mouth as needed (headache).     nitroGLYCERIN (NITROSTAT) 0.4 MG SL tablet Place 1 tablet (0.4 mg total) under the tongue every 5 (five) minutes x 3 doses as needed for chest pain. 25 tablet 12   atorvastatin (LIPITOR) 80 MG tablet Take 1 tablet (80 mg total) by mouth daily. 30 tablet 11   clopidogrel (PLAVIX) 75 MG tablet Take 1 tablet (75 mg total) by mouth daily. Take 2 tablets first day, then 1 daily 30 tablet 10   metoprolol succinate (TOPROL-XL) 25 MG 24 hr tablet Take 1 tablet (25 mg total) by mouth daily. 30 tablet 11   No current facility-administered medications for this visit.    Allergies:   Bee venom     ROS:  Please  see the history of present illness.   Otherwise, review of systems are positive for none.   All other systems are reviewed and negative.    PHYSICAL EXAM: VS:  BP 108/70    Pulse 61    Ht 6' (1.829 m)    Wt 219 lb 12.8 oz (99.7 kg)    SpO2 95%    BMI 29.81 kg/m  , BMI Body mass index is 29.81 kg/m. GENERAL:  Well appearing NECK:  No jugular venous distention, waveform within normal limits, carotid upstroke brisk and symmetric, no bruits, no thyromegaly LUNGS:  Clear to auscultation bilaterally CHEST:  Unremarkable HEART:  PMI not displaced or sustained,S1 and S2 within normal limits, no S3, no S4, no clicks, no rubs, no murmurs ABD:  Flat, positive bowel sounds normal in frequency in pitch, no bruits, no rebound, no guarding, no midline pulsatile mass, no hepatomegaly, no  splenomegaly EXT:  2 plus pulses throughout, no edema, no cyanosis no clubbing       CARDIAC CATH:   Diagnostic Dominance: Right Intervention    EKG:  EKG is ordered today. The ekg ordered today demonstrates normal sinus rhythm, rate 61, right bundle branch block with possible old inferior infarct, no acute ST-T wave changes.   Recent Labs: 05/06/2021: B Natriuretic Peptide 14.5; BUN 12; Creatinine, Ser 1.02; Hemoglobin 17.1; Platelets 218; Potassium 4.1; Sodium 133    Lipid Panel    Component Value Date/Time   CHOL 213 (H) 04/28/2021 0113   TRIG 191 (H) 04/28/2021 0113   HDL 41 04/28/2021 0113   CHOLHDL 5.2 04/28/2021 0113   VLDL 38 04/28/2021 0113   LDLCALC 134 (H) 04/28/2021 0113   LDLDIRECT 142.0 05/25/2013 0844      Wt Readings from Last 3 Encounters:  05/25/21 219 lb 12.8 oz (99.7 kg)  05/06/21 220 lb (99.8 kg)  04/28/21 226 lb (102.5 kg)      Other studies Reviewed: Additional studies/ records that were reviewed today include: ED records. Review of the above records demonstrates:  Please see elsewhere in the note.     ASSESSMENT AND PLAN:  CAD: We had a long discussion about secondary risk reduction.  He is going to be referred to cardiac rehab.  No change in therapy otherwise.  ISCHEMIC CARDIOMYOPATHY: His blood pressure would probably not allow med titration.  My plan would be in about 6 months to see if we can consider med titration and then if not recheck his echocardiogram to see if he has a new baseline.  DYSLIPIDEMIA: Goal will be an LDL less than 50.  FATIGUE:  The patient has an increased Epworth scale and snoring.  He needs a home sleep test.   Current medicines are reviewed at length with the patient today.  The patient does not have concerns regarding medicines.  The following changes have been made:  no change  Labs/ tests ordered today include: None  Orders Placed This Encounter  Procedures   AMB referral to cardiac rehabilitation    EKG 12-Lead   Home sleep test     Disposition:   FU with APP in six month.     Signed, [name], MD  05/25/2021 12:50 PM    [facility]

## 2021-05-25 ENCOUNTER — Encounter: Payer: Self-pay | Admitting: Cardiology

## 2021-05-25 ENCOUNTER — Other Ambulatory Visit: Payer: Self-pay

## 2021-05-25 ENCOUNTER — Ambulatory Visit (INDEPENDENT_AMBULATORY_CARE_PROVIDER_SITE_OTHER): Payer: No Typology Code available for payment source | Admitting: Cardiology

## 2021-05-25 ENCOUNTER — Ambulatory Visit: Payer: No Typology Code available for payment source | Admitting: Cardiology

## 2021-05-25 ENCOUNTER — Telehealth: Payer: Self-pay | Admitting: Cardiology

## 2021-05-25 VITALS — BP 108/70 | HR 61 | Ht 72.0 in | Wt 219.8 lb

## 2021-05-25 DIAGNOSIS — I2510 Atherosclerotic heart disease of native coronary artery without angina pectoris: Secondary | ICD-10-CM | POA: Diagnosis not present

## 2021-05-25 DIAGNOSIS — I255 Ischemic cardiomyopathy: Secondary | ICD-10-CM

## 2021-05-25 MED ORDER — ATORVASTATIN CALCIUM 80 MG PO TABS: 80.0000 mg | ORAL_TABLET | Freq: Every day | ORAL | 11 refills | Status: DC

## 2021-05-25 MED ORDER — METOPROLOL SUCCINATE ER 25 MG PO TB24: 25.0000 mg | ORAL_TABLET | Freq: Every day | ORAL | 11 refills | Status: DC

## 2021-05-25 MED ORDER — CLOPIDOGREL BISULFATE 75 MG PO TABS: 75.0000 mg | ORAL_TABLET | Freq: Every day | ORAL | 10 refills | Status: DC

## 2021-05-25 NOTE — Telephone Encounter (Signed)
Pt c/o medication issue:  1. Name of Medication: losartan (COZAAR) 25 MG tablet   2. How are you currently taking this medication (dosage and times per day)? Take 1 tablet (25 mg total) by mouth daily.  3. Are you having a reaction (difficulty breathing--STAT)? no  4. What is your medication issue? Pt pcp is wanting him to DC this medication..Cardiologist is saying he can continue... please advise

## 2021-05-25 NOTE — Telephone Encounter (Signed)
Spoke with patient who has a concern over taking losartan 25 mg daily since his hospitalization at the first of the month. He stated that his PCP wants him off the medication but cardiology wants him on the medication. He wants clarification on losartan.

## 2021-05-25 NOTE — Progress Notes (Deleted)
Cardiology Office Note   Date:  05/25/2021   ID:  Leroy Campbell., DOB 25-Feb-1961, MRN [id number]  PCP:  [name], PA  Cardiologist:   [name], MD Referring:  ED  No chief complaint on file.   History of Present Illness: Leroy Urieta. is a 61 y.o. male who presents for evaluation of CAD.  He is status post PCI of an occluded RCA.  He had an EF of 45%.  This was in late December.    He was seen again in the ED on 1/1 for a rash but was also having chest tightness. EKG with RBBB,  LAD and new Q-waves. Was recommended to stop both losartan and Brilinta and load with Plavix and was started on Augmentin for possible sinus infection. Patient reports that he stopped the Brilinta but has continued on the Losartan. He feels that his symptoms have completely resolved.  Today he shares that he feels he is getting tired easily. Gives an example of getting tired while walking around [place]. Has been averaging about 1800 steps a day in the last week. Previously was doing 10-15k steps. He has been very intentional about trying to increase his exercise slowly and following his post-discharge instructions. Has been strictly monitoring his sodium to stay under 2 g a day. He has lost just over 15 lbs because he is "not eating as much".   Has reports some intermittent chest soreness. Rest makes it better. Has some SOB with exertion.   States that he does not sleep well. Wakes up feeling tired most days. He snores. All of this has been an ongoing issue for several years.   Some dizziness, no syncope or pre-syncope. No edema.   FHx: Mom with MI at 18, Father had one in 10s and required quadruple bypass.   Past Medical History:  Diagnosis Date   Gout    Hyperlipidemia    Ischemic cardiomyopathy    MI (myocardial infarction) ([place]) 04/27/2021    Past Surgical History:  Procedure Laterality Date   CORONARY STENT INTERVENTION N/A 04/27/2021   Procedure: CORONARY STENT  INTERVENTION;  Surgeon: Martinique, Peter M, MD;  Location: [facility];  Service: Cardiovascular;  Laterality: N/A;   KNEE ARTHROSCOPY Bilateral    x2 bilaterally   KNEE SURGERY Bilateral    x's 4-- 2 surgeries on both knees   LEFT AND RIGHT HEART CATHETERIZATION WITH CORONARY ANGIOGRAM N/A 10/19/2013   Procedure: LEFT AND RIGHT HEART CATHETERIZATION WITH CORONARY ANGIOGRAM;  Surgeon: Sinclair Grooms, MD;  Location: [facility];  Service: Cardiovascular;  Laterality: N/A;   LEFT HEART CATH AND CORONARY ANGIOGRAPHY N/A 04/27/2021   Procedure: LEFT HEART CATH AND CORONARY ANGIOGRAPHY;  Surgeon: Martinique, Peter M, MD;  Location: [facility];  Service: Cardiovascular;  Laterality: N/A;   SHOULDER SURGERY Right 05/06/2009   TONSILLECTOMY AND ADENOIDECTOMY       Current Outpatient Medications  Medication Sig Dispense Refill   allopurinol (ZYLOPRIM) 100 MG tablet Take 200 mg by mouth 2 (two) times daily.     aspirin 81 MG chewable tablet Chew 1 tablet (81 mg total) by mouth daily.     naproxen sodium (ALEVE) 220 MG tablet Take 220 mg by mouth as needed (headache).     nitroGLYCERIN (NITROSTAT) 0.4 MG SL tablet Place 1 tablet (0.4 mg total) under the tongue every 5 (five) minutes x 3 doses as needed for chest pain. 25 tablet 12  atorvastatin (LIPITOR) 80 MG tablet Take 1 tablet (80 mg total) by mouth daily. 30 tablet 11   clopidogrel (PLAVIX) 75 MG tablet Take 1 tablet (75 mg total) by mouth daily. Take 2 tablets first day, then 1 daily 30 tablet 10   metoprolol succinate (TOPROL-XL) 25 MG 24 hr tablet Take 1 tablet (25 mg total) by mouth daily. 30 tablet 11   No current facility-administered medications for this visit.    Allergies:   Bee venom   ROS:  Please see the history of present illness.   Otherwise, review of systems are positive for fatigue, snoring, dyspnea on exertion, chest discomfort.   All other systems are reviewed and negative.   PHYSICAL EXAM: VS:  BP 108/70     Pulse 61    Ht 6' (1.829 m)    Wt 219 lb 12.8 oz (99.7 kg)    SpO2 95%    BMI 29.81 kg/m  , BMI Body mass index is 29.81 kg/m. GENERAL:  Well appearing NECK:  No jugular venous distention, waveform within normal limits, carotid upstroke brisk and symmetric, no bruits, no thyromegaly LUNGS:  Clear to auscultation bilaterally CHEST:  Unremarkable HEART:  PMI not displaced or sustained,S1 and S2 within normal limits, no S3, no S4, no clicks, no rubs, no murmurs ABD:  Overweight, soft, positive bowel sounds normal in frequency in pitch, no bruits, no rebound, no guarding, no midline pulsatile mass, no hepatomegaly, no splenomegaly EXT:  2 plus pulses throughout, no edema, no cyanosis no clubbing  CARDIAC CATH:   Diagnostic Dominance: Right Intervention    EKG:  EKG is ordered today. The ekg ordered today demonstrates sinus  rhythm rate 61, RBBB, probable old anterior infarct. No new ST changes.  Recent Labs: 05/06/2021: B Natriuretic Peptide 14.5; BUN 12; Creatinine, Ser 1.02; Hemoglobin 17.1; Platelets 218; Potassium 4.1; Sodium 133    Lipid Panel    Component Value Date/Time   CHOL 213 (H) 04/28/2021 0113   TRIG 191 (H) 04/28/2021 0113   HDL 41 04/28/2021 0113   CHOLHDL 5.2 04/28/2021 0113   VLDL 38 04/28/2021 0113   LDLCALC 134 (H) 04/28/2021 0113   LDLDIRECT 142.0 05/25/2013 0844      Wt Readings from Last 3 Encounters:  05/25/21 219 lb 12.8 oz (99.7 kg)  05/06/21 220 lb (99.8 kg)  04/28/21 226 lb (102.5 kg)      Other studies Reviewed: Additional studies/ records that were reviewed today include: CXR 05/2021. Review of the above records demonstrates:No active disease.  Please see elsewhere in the note.     ASSESSMENT AND PLAN:  CAD: On duel anti-platelet therapy with 81 mg ASA and 75 mg Plavix, should continue for 1-year post-PCI. Continue beta-blocker. Long-discussion today regarding lifestyle modifications, patient is very motivated to make positive changes.    ISCHEMIC CARDIOMYOPATHY s/p PCI to RCA 04/2021:  Echo from 04/2021 shows EF 45% with mildly decreased LV function and regional wall abnormalities. Inferior hypokinesis. Could consider repeat Echo in 6 months. Not much room for afterload reduction today. Will place referral to cardiac referral.   DYSLIPIDEMIA:  Lipid panel from 04/2021 with LDL 134, total cholesterol 213 and triglycerides 191.  On 80 mg Atorvastatin. Can consider repeat in 4-6 weeks.   SNORING, DAYTIME FATIGUE: Concern for sleep apnea. Will arrange for home sleep study.  Current medicines are reviewed at length with the patient today.  The patient does not have concerns regarding medicines.  The following changes have been made:  None  Labs/ tests ordered today include:   Orders Placed This Encounter  Procedures   AMB referral to cardiac rehabilitation   EKG 12-Lead   Home sleep test    Disposition:  FU with APP in 6 months    Signed, Sharion Settler, DO  05/25/2021 11:30 AM    [place]

## 2021-05-25 NOTE — Patient Instructions (Signed)
Medication Instructions:  Your physician recommends that you continue on your current medications as directed. Please refer to the Current Medication list given to you today.  *If you need a refill on your cardiac medications before your next appointment, please call your pharmacy*   Testing/Procedures: Your physician has recommended that you have a sleep study. This test records several body functions during sleep, including: brain activity, eye movement, oxygen and carbon dioxide blood levels, heart rate and rhythm, breathing rate and rhythm, the flow of air through your mouth and nose, snoring, body muscle movements, and chest and belly movement.    Follow-Up: At [facility], you and your health needs are our priority.  As part of our continuing mission to provide you with exceptional heart care, we have created designated Provider Care Teams.  These Care Teams include your primary Cardiologist (physician) and Advanced Practice Providers (APPs -  Physician Assistants and Nurse Practitioners) who all work together to provide you with the care you need, when you need it.  We recommend signing up for the patient portal called "MyChart".  Sign up information is provided on this After Visit Summary.  MyChart is used to connect with patients for Virtual Visits (Telemedicine).  Patients are able to view lab/test results, encounter notes, upcoming appointments, etc.  Non-urgent messages can be sent to your provider as well.   To learn more about what you can do with MyChart, go to [email or web address].    Your next appointment:   6 month(s)  The format for your next appointment:   In Person  Provider:   Almyra Deforest, PA-C

## 2021-05-28 ENCOUNTER — Other Ambulatory Visit: Payer: Self-pay

## 2021-05-28 MED ORDER — LOSARTAN POTASSIUM 25 MG PO TABS: 25.0000 mg | ORAL_TABLET | Freq: Every day | ORAL | 11 refills | Status: DC

## 2021-05-28 NOTE — Telephone Encounter (Signed)
Spoke with patient and informed him of the losartan 25 mg daily prescription and for him to monitor his blood pressure. If he feels light-headed he is to contact the clinic. Order sent to his pharmacy. Pt voiced understanding of this conversation.

## 2021-06-06 ENCOUNTER — Telehealth (HOSPITAL_COMMUNITY): Payer: Self-pay

## 2021-06-06 NOTE — Telephone Encounter (Signed)
Called patient to see if he was interested in participating in the Cardiac Rehab Program. Patient stated yes. Patient will come in for orientation on 07/03/2021@9 :30am and will attend the 3:00pm exercise class.   Tourist information centre manager.

## 2021-06-06 NOTE — Telephone Encounter (Signed)
Pt insurance is active and benefits verified through [place] [address], DED 0/0 met, out of pocket $3,500/$3,500 met, co-insurance 0%. no pre-authorization required, [employer] 06/06/2021_0 :08am, REF# [id number]

## 2021-06-08 ENCOUNTER — Telehealth: Payer: Self-pay | Admitting: Cardiology

## 2021-06-08 NOTE — Telephone Encounter (Signed)
Leroy Campbell, nurse case manager for [employer], states the patient was told he would have a sleep study but has not heard anything. She is calling to follow up on whether a referral was placed and to who. She requests the call back go to the patient.

## 2021-06-14 NOTE — Telephone Encounter (Signed)
Joey, nurse case Freight forwarder for [employer], calling back. Phone: [phone]

## 2021-06-21 ENCOUNTER — Other Ambulatory Visit: Payer: Self-pay | Admitting: Cardiology

## 2021-06-21 DIAGNOSIS — I2510 Atherosclerotic heart disease of native coronary artery without angina pectoris: Secondary | ICD-10-CM

## 2021-06-21 NOTE — Telephone Encounter (Signed)
Patient informed of HST appointment details.

## 2021-06-22 ENCOUNTER — Other Ambulatory Visit: Payer: Self-pay

## 2021-06-22 ENCOUNTER — Encounter: Payer: No Typology Code available for payment source | Attending: Cardiology | Admitting: Registered"

## 2021-06-22 DIAGNOSIS — E669 Obesity, unspecified: Secondary | ICD-10-CM | POA: Diagnosis not present

## 2021-06-22 DIAGNOSIS — Z713 Dietary counseling and surveillance: Secondary | ICD-10-CM | POA: Insufficient documentation

## 2021-06-22 DIAGNOSIS — E785 Hyperlipidemia, unspecified: Secondary | ICD-10-CM | POA: Diagnosis not present

## 2021-06-22 NOTE — Progress Notes (Signed)
Medical Nutrition Therapy  Appointment Start time:  [phone]  Appointment End time:  0915  Primary concerns today: wants to eat healthy for his heart health Referral diagnosis: e66, and hyperlipidemia Preferred learning style: no preference indicated) Learning readiness: ready, change in progress  This patient is accompanied in the office by his spouse.  NUTRITION ASSESSMENT   Anthropometrics  Patient declined weight measurement today, states he weighs himself at home - has lost 22 lbs. Since discharge from hospital 04/29/21  Wt Readings from Last 3 Encounters:  05/25/21 219 lb 12.8 oz (99.7 kg)  05/06/21 220 lb (99.8 kg)  04/28/21 226 lb (102.5 kg)      Clinical Medical Hx: ST discharged from hospital on Dec 25 Medications: reviewed Labs:  Notable Signs/Symptoms: ambulates with cane, has exercise restrictions and gets winded easily.  Lifestyle & Dietary Hx ST discharged from hospital on Dec 25th, cut food intake in half, feels hungry but getting used to it. Pt states he grew up eating a lot of full to the point of discomfort and that it was normal to eat that way in his family. Pt states he also eats fast and that habit was reinforced with he was in the [employer]  Physical Activity: exercise restriction s/p ST, with ischemic cariomyopathy. Pt reports he will be starting cardiac rehab soon, Pt reports he is up and down a lot at work, avg 3500 steps/day; before surgery was avg 10K. Pt states when walking on treadmill 7 min, at 2 mi per hour, gets winded.  Pt reports due to history of gout he eats mostly [place], chicken and fish, states red meat gets gout flares.   Pt states he loves spaghetti but eats much less now. Pt states he cut meals in half, cut out sweet tea, reading labels for sodium and initially had a goal of 2000 mg, but states MD is more concerned about fat intake than sodium at this point.  Patient provided sample meal will get at restaurant. Corn, mashed potatoes, 1/2  chicken breast a little piece of corn bread. Usually eats at restaurant for lunch.  At home wife states she uses air fryer, Mrs Deliah Boston (has always used), pink salt occasionally, low sodium butter, other strategies to reduce salt intake.  Estimated daily fluid intake: 60 oz Supplements: none Sleep: 3-4 hrs. Pt reports he needs to get a sleep apnea test. Stress/self-care: not assessed Current average weekly physical activity:   24-Hr Dietary Recall First Meal: v8 pomegranite blueberry drink, breakfast bar Snack: little debbie blueberry muffin Second Meal: 1/2 fried chicken breast, corn, mashed potatoes (no gravy), unsweet tea Snack: apple sauce, 2-3 wheat thins Third Meal: chicken, corn, peas, Snack: nutty buddy ice cream bar (1-2x/week) Beverages: 1-2 cups of coffee, water 40 oz, unsweet tea 3-4 days per week when going out to eat.   NUTRITION DIAGNOSIS  NB-1.1 Food and nutrition-related knowledge deficit As related to purpose and details of DASH and Mediterranean diets.  As evidenced by states gain of new knoweldge.   NUTRITION INTERVENTION  Nutrition education (E-1) on the following topics:  DASH vs Mediterranean Diet Total fat vs Sat Fat Reading Labels  Handouts Provided Include  Plate Planner ([place]) DASH diet brief (NIH) [place] for Change Teaching method utilized: Visual & Auditory  Demonstrated degree of understanding via: Teach Back  Barriers to learning/adherence to lifestyle change: none  Goals Established by Pt Read labels for saturated fat. For more information on reading labels look on the  websites that are involved in the dietary guidelines: CDC, USDA, NIH, FDA  Use your DASH & Mediterranean cookbook for some heart healthy meal ideas.  Heart.org has a nice recipe database too To learn more about the DASH diet, look up DASH diet on the NIH website. Use the handout to track vegetable/fruit intake and saturated fat  Consider making a few  changes specific such as having a different breakfast bar to get less sat fat.   MONITORING & EVALUATION Dietary intake, weekly physical activity, and labs in 4-6 weeks.

## 2021-06-22 NOTE — Patient Instructions (Addendum)
You can sign up for a 2-week program for better sleep tips  https://www.sleepfoundation.org/  Read labels for saturated fat. For more information on reading labels look on the websites that are involved in the dietary guidelines: CDC, USDA, NIH, FDA  Use your DASH & Mediterranean cookbook for some heart healthy meal ideas.  Heart.org has a nice recipe database too To learn more about the DASH diet, look up DASH diet on the NIH website. Use the handout to track vegetable/fruit intake and saturated fat  Consider making a few changes specific such as having a different breakfast bar to get less sat fat.

## 2021-07-03 ENCOUNTER — Encounter (HOSPITAL_COMMUNITY): Payer: Self-pay

## 2021-07-03 ENCOUNTER — Other Ambulatory Visit: Payer: Self-pay

## 2021-07-03 ENCOUNTER — Encounter (HOSPITAL_COMMUNITY)
Admission: RE | Admit: 2021-07-03 | Discharge: 2021-07-03 | Disposition: A | Discharge status: Home / Self Care | Payer: No Typology Code available for payment source | Source: Ambulatory Visit | Attending: Cardiology | Admitting: Cardiology

## 2021-07-03 VITALS — BP 108/70 | HR 65 | Ht 69.75 in | Wt 213.8 lb

## 2021-07-03 DIAGNOSIS — I2111 ST elevation (STEMI) myocardial infarction involving right coronary artery: Secondary | ICD-10-CM | POA: Insufficient documentation

## 2021-07-03 DIAGNOSIS — Z955 Presence of coronary angioplasty implant and graft: Secondary | ICD-10-CM | POA: Insufficient documentation

## 2021-07-03 HISTORY — DX: Atherosclerotic heart disease of native coronary artery without angina pectoris: I25.10

## 2021-07-03 NOTE — Progress Notes (Signed)
Patient here for cardiac rehab orientation. Patient reports that he experienced a bad episode of indigestion on Sunday he ate some peanuts and salsa. Leroy Campbell reported that he belched and the feeling went away. Leroy Campbell says he is still experiencing some indigestion blow the sternal area in his upper abdomen. Leroy Campbell denies having chest pain but does report having some discomfort in his upper abdomen area. Telemetry rhythm Sinus with a tall t wave. Blood pressure 108/70 heart rate 66. Oxygen saturation 98% on room air. Vin Bhagat PAC paged and notified. Spoke with the patient over the phone. Vin said the indigestion does not appear to be cardiac related. Vin said he told the patient  to try some Tums and OTC Pepcid. Vin said that Leroy Campbell is okay to proceed with exercise at cardiac rehab. Will obtain 12 lead if patient develops chest pain per Vin.Barnet Pall, RN,BSN 07/03/2021 10:33 AM

## 2021-07-03 NOTE — Progress Notes (Signed)
Cardiac Individual Treatment Plan  Patient Details  Name: Leroy Campbell. MRN: [id number] Date of Birth: 1960/12/22 Referring Provider:   Flowsheet Row CARDIAC REHAB PHASE II ORIENTATION from 07/03/2021 in [place]  Referring Provider [name], MD       Initial Encounter Date:  [place] from 07/03/2021 in [place]  Date 07/03/21       Visit Diagnosis: 04/27/21 STEMI  04/27/21 DES RCA  Patient's Home Medications on Admission:  Current Outpatient Medications:    allopurinol (ZYLOPRIM) 100 MG tablet, Take 200 mg by mouth 2 (two) times daily., Disp: , Rfl:    aspirin 81 MG chewable tablet, Chew 1 tablet (81 mg total) by mouth daily., Disp: , Rfl:    atorvastatin (LIPITOR) 80 MG tablet, Take 1 tablet (80 mg total) by mouth daily., Disp: 30 tablet, Rfl: 11   clopidogrel (PLAVIX) 75 MG tablet, Take 1 tablet (75 mg total) by mouth daily. Take 2 tablets first day, then 1 daily (Patient taking differently: Take 75 mg by mouth daily.), Disp: 30 tablet, Rfl: 10   colchicine 0.6 MG tablet, Take 0.6 mg by mouth daily as needed (gout)., Disp: , Rfl:    losartan (COZAAR) 25 MG tablet, Take 1 tablet (25 mg total) by mouth daily., Disp: 30 tablet, Rfl: 11   metoprolol succinate (TOPROL-XL) 25 MG 24 hr tablet, Take 1 tablet (25 mg total) by mouth daily., Disp: 30 tablet, Rfl: 11   naproxen sodium (ALEVE) 220 MG tablet, Take 220 mg by mouth daily as needed (headache)., Disp: , Rfl:    nitroGLYCERIN (NITROSTAT) 0.4 MG SL tablet, Place 1 tablet (0.4 mg total) under the tongue every 5 (five) minutes x 3 doses as needed for chest pain., Disp: 25 tablet, Rfl: 12   vitamin B-12 (CYANOCOBALAMIN) 1000 MCG tablet, Take 1,000 mcg by mouth 3 (three) times a week., Disp: , Rfl:    vitamin C (ASCORBIC ACID) 500 MG tablet, Take 500 mg by mouth daily., Disp: , Rfl:   Past Medical History: Past  Medical History:  Diagnosis Date   Coronary artery disease    Gout    Hyperlipidemia    Ischemic cardiomyopathy    MI (myocardial infarction) ([place]) 04/27/2021    Tobacco Use: Social History   Tobacco Use  Smoking Status Never  Smokeless Tobacco Never    Labs: Recent Review Flowsheet Data     Labs for ITP Cardiac and Pulmonary Rehab Latest Ref Rng & Units 05/25/2013 07/19/2014 11/17/2015 04/27/2021 04/28/2021   Cholestrol 0 - 200 mg/dL 202(H) 222(H) 193 - 213(H)   LDLCALC 0 - 99 mg/dL - 152(H) 134(H) - 134(H)   LDLDIRECT mg/dL 142.0 - - - -   HDL >40 mg/dL 38.20(L) 46.10 35.50(L) - 41   Trlycerides <150 mg/dL 153.0(H) 122.0 118.0 - 191(H)   Hemoglobin A1c 4.8 - 5.6 % - - - 5.4 -       Capillary Blood Glucose: Lab Results  Component Value Date   GLUCAP 126 (H) 04/27/2021     Exercise Target Goals: Exercise Program Goal: Individual exercise prescription set using results from initial 6 min walk test and THRR while considering  patients activity barriers and safety.   Exercise Prescription Goal: Initial exercise prescription builds to 30-45 minutes a day of aerobic activity, 2-3 days per week.  Home exercise guidelines will be given to patient during program as part of exercise prescription that  the participant will acknowledge.  Activity Barriers & Risk Stratification:  Activity Barriers & Cardiac Risk Stratification - 07/03/21 1010       Activity Barriers & Cardiac Risk Stratification   Activity Barriers Other (comment);History of Falls    Comments Patient has h/o 4 knee surgeries (2 each knee) and right shoulder surgery.    Cardiac Risk Stratification High             6 Minute Walk:  6 Minute Walk     Row Name 07/03/21 1039         6 Minute Walk   Phase Initial     Distance 1826 feet     Walk Time 6 minutes     # of Rest Breaks 0     MPH 3.46     METS 4.02     RPE 12     Perceived Dyspnea  0     VO2 Peak 14.06     Symptoms Yes (comment)      Comments Patient c/o feeling tired.     Resting HR 65 bpm     Resting BP 108/70     Resting Oxygen Saturation  98 %     Exercise Oxygen Saturation  during 6 min walk 98 %     Max Ex. HR 83 bpm     Max Ex. BP 120/62     2 Minute Post BP 100/62              Oxygen Initial Assessment:   Oxygen Re-Evaluation:   Oxygen Discharge (Final Oxygen Re-Evaluation):   Initial Exercise Prescription:  Initial Exercise Prescription - 07/03/21 1100       Date of Initial Exercise RX and Referring Provider   Date 07/03/21    Referring Provider [name], MD    Expected Discharge Date 08/31/21      Treadmill   MPH 2.4    Grade 0    Minutes 15    METs 2.84      Bike   Level 2    Minutes 15    METs 3.5      Prescription Details   Frequency (times per week) 3    Duration Progress to 30 minutes of continuous aerobic without signs/symptoms of physical distress      Intensity   THRR 40-80% of Max Heartrate 64-129    Ratings of Perceived Exertion 11-13    Perceived Dyspnea 0-4      Progression   Progression Continue to progress workloads to maintain intensity without signs/symptoms of physical distress.      Resistance Training   Training Prescription Yes    Weight 4 lbs    Reps 10-15             Perform Capillary Blood Glucose checks as needed.  Exercise Prescription Changes:   Exercise Comments:   Exercise Goals and Review:   Exercise Goals     Row Name 07/03/21 1010             Exercise Goals   Increase Physical Activity Yes       Intervention Provide advice, education, support and counseling about physical activity/exercise needs.;Develop an individualized exercise prescription for aerobic and resistive training based on initial evaluation findings, risk stratification, comorbidities and participant's personal goals.       Expected Outcomes Short Term: Attend rehab on a regular basis to increase amount of physical activity.;Long Term: Exercising  regularly at least 3-5 days a week.;Long Term:  Add in home exercise to make exercise part of routine and to increase amount of physical activity.       Increase Strength and Stamina Yes       Intervention Provide advice, education, support and counseling about physical activity/exercise needs.;Develop an individualized exercise prescription for aerobic and resistive training based on initial evaluation findings, risk stratification, comorbidities and participant's personal goals.       Expected Outcomes Short Term: Increase workloads from initial exercise prescription for resistance, speed, and METs.;Short Term: Perform resistance training exercises routinely during rehab and add in resistance training at home;Long Term: Improve cardiorespiratory fitness, muscular endurance and strength as measured by increased METs and functional capacity (6MWT)       Able to understand and use rate of perceived exertion (RPE) scale Yes       Intervention Provide education and explanation on how to use RPE scale       Expected Outcomes Short Term: Able to use RPE daily in rehab to express subjective intensity level;Long Term:  Able to use RPE to guide intensity level when exercising independently       Knowledge and understanding of Target Heart Rate Range (THRR) Yes       Intervention Provide education and explanation of THRR including how the numbers were predicted and where they are located for reference       Expected Outcomes Short Term: Able to state/look up THRR;Long Term: Able to use THRR to govern intensity when exercising independently;Short Term: Able to use daily as guideline for intensity in rehab       Able to check pulse independently Yes       Intervention Provide education and demonstration on how to check pulse in carotid and radial arteries.;Review the importance of being able to check your own pulse for safety during independent exercise       Expected Outcomes Short Term: Able to explain why pulse  checking is important during independent exercise;Long Term: Able to check pulse independently and accurately       Understanding of Exercise Prescription Yes       Intervention Provide education, explanation, and written materials on patient's individual exercise prescription       Expected Outcomes Short Term: Able to explain program exercise prescription;Long Term: Able to explain home exercise prescription to exercise independently                Exercise Goals Re-Evaluation :   Discharge Exercise Prescription (Final Exercise Prescription Changes):   Nutrition:  Target Goals: Understanding of nutrition guidelines, daily intake of sodium 1500mg , cholesterol 200mg , calories 30% from fat and 7% or less from saturated fats, daily to have 5 or more servings of fruits and vegetables.  Biometrics:  Pre Biometrics - 07/03/21 0957       Pre Biometrics   Waist Circumference 42 inches    Hip Circumference 41 inches    Waist to Hip Ratio 1.02 %    Triceps Skinfold 12.5 mm    % Body Fat 28.4 %    Grip Strength 42 kg    Flexibility 10.5 in    Single Leg Stand 30 seconds              Nutrition Therapy Plan and Nutrition Goals:   Nutrition Assessments:  MEDIFICTS Score Key: ?70 Need to make dietary changes  40-70 Heart Healthy Diet ? 40 Therapeutic Level Cholesterol Diet    Picture Your Plate Scores: <71 Unhealthy dietary pattern with much room  for improvement. 41-50 Dietary pattern unlikely to meet recommendations for good health and room for improvement. 51-60 More healthful dietary pattern, with some room for improvement.  >60 Healthy dietary pattern, although there may be some specific behaviors that could be improved.    Nutrition Goals Re-Evaluation:   Nutrition Goals Re-Evaluation:   Nutrition Goals Discharge (Final Nutrition Goals Re-Evaluation):   Psychosocial: Target Goals: Acknowledge presence or absence of significant depression and/or stress,  maximize coping skills, provide positive support system. Participant is able to verbalize types and ability to use techniques and skills needed for reducing stress and depression.  Initial Review & Psychosocial Screening:  Initial Psych Review & Screening - 07/03/21 1226       Initial Review   Current issues with Current Stress Concerns    Source of Stress Concerns Family    Comments Danny's mother has Dementia. Kasandra Knudsen helps to take care of her as she has memory issues      [place]? Yes   Kasandra Knudsen has his girlfriend for support.   Concerns Inappropriate over/under dependence on family/friends   Kasandra Knudsen lives with his girlfriend, mother who has dementia and step father     Barriers   Psychosocial barriers to participate in program The patient should benefit from training in stress management and relaxation.      Screening Interventions   Interventions Encouraged to exercise;To provide support and resources with identified psychosocial needs    Expected Outcomes Long Term Goal: Stressors or current issues are controlled or eliminated.             Quality of Life Scores:  Quality of Life - 07/03/21 1124       Quality of Life   Select Quality of Life      Quality of Life Scores   Health/Function Pre 21.27 %    Socioeconomic Pre 21 %    Psych/Spiritual Pre 22.71 %    Family Pre 23.7 %    GLOBAL Pre 21.87 %            Scores of 19 and below usually indicate a poorer quality of life in these areas.  A difference of  2-3 points is a clinically meaningful difference.  A difference of 2-3 points in the total score of the Quality of Life Index has been associated with significant improvement in overall quality of life, self-image, physical symptoms, and general health in studies assessing change in quality of life.  PHQ-9: Recent Review Flowsheet Data     Depression screen [facility] 2/9 07/03/2021 11/17/2015 02/18/2013   Decreased Interest 0 0 0   Down,  Depressed, Hopeless 0 0 0   PHQ - 2 Score 0 0 0      Interpretation of Total Score  Total Score Depression Severity:  1-4 = Minimal depression, 5-9 = Mild depression, 10-14 = Moderate depression, 15-19 = Moderately severe depression, 20-27 = Severe depression   Psychosocial Evaluation and Intervention:   Psychosocial Re-Evaluation:   Psychosocial Discharge (Final Psychosocial Re-Evaluation):   Vocational Rehabilitation: Provide vocational rehab assistance to qualifying candidates.   Vocational Rehab Evaluation & Intervention:  Vocational Rehab - 07/03/21 1231       Initial Vocational Rehab Evaluation & Intervention   Assessment shows need for Vocational Rehabilitation No   Kasandra Knudsen works full time and does not need vocational rehab at this time            Education: Education Goals: Education classes will be provided  on a weekly basis, covering required topics. Participant will state understanding/return demonstration of topics presented.  Learning Barriers/Preferences:  Learning Barriers/Preferences - 07/03/21 1230       Learning Barriers/Preferences   Learning Barriers Sight   wears contacts, glasses   Learning Preferences Skilled Demonstration;Pictoral;Written Material             Education Topics: Count Your Pulse:  -Group instruction provided by verbal instruction, demonstration, patient participation and written materials to support subject.  Instructors address importance of being able to find your pulse and how to count your pulse when at home without a heart monitor.  Patients get hands on experience counting their pulse with staff help and individually.   Heart Attack, Angina, and Risk Factor Modification:  -Group instruction provided by verbal instruction, video, and written materials to support subject.  Instructors address signs and symptoms of angina and heart attacks.    Also discuss risk factors for heart disease and how to make changes to improve  heart health risk factors.   Functional Fitness:  -Group instruction provided by verbal instruction, demonstration, patient participation, and written materials to support subject.  Instructors address safety measures for doing things around the house.  Discuss how to get up and down off the floor, how to pick things up properly, how to safely get out of a chair without assistance, and balance training.   Meditation and Mindfulness:  -Group instruction provided by verbal instruction, patient participation, and written materials to support subject.  Instructor addresses importance of mindfulness and meditation practice to help reduce stress and improve awareness.  Instructor also leads participants through a meditation exercise.    Stretching for Flexibility and Mobility:  -Group instruction provided by verbal instruction, patient participation, and written materials to support subject.  Instructors lead participants through series of stretches that are designed to increase flexibility thus improving mobility.  These stretches are additional exercise for major muscle groups that are typically performed during regular warm up and cool down.   Hands Only CPR:  -Group verbal, video, and participation provides a basic overview of AHA guidelines for community CPR. Role-play of emergencies allow participants the opportunity to practice calling for help and chest compression technique with discussion of AED use.   Hypertension: -Group verbal and written instruction that provides a basic overview of hypertension including the most recent diagnostic guidelines, risk factor reduction with self-care instructions and medication management.    Nutrition I class: Heart Healthy Eating:  -Group instruction provided by PowerPoint slides, verbal discussion, and written materials to support subject matter. The instructor gives an explanation and review of the Therapeutic Lifestyle Changes diet recommendations,  which includes a discussion on lipid goals, dietary fat, sodium, fiber, plant stanol/sterol esters, sugar, and the components of a well-balanced, healthy diet.   Nutrition II class: Lifestyle Skills:  -Group instruction provided by PowerPoint slides, verbal discussion, and written materials to support subject matter. The instructor gives an explanation and review of label reading, grocery shopping for heart health, heart healthy recipe modifications, and ways to make healthier choices when eating out.   Diabetes Question & Answer:  -Group instruction provided by PowerPoint slides, verbal discussion, and written materials to support subject matter. The instructor gives an explanation and review of diabetes co-morbidities, pre- and post-prandial blood glucose goals, pre-exercise blood glucose goals, signs, symptoms, and treatment of hypoglycemia and hyperglycemia, and foot care basics.   Diabetes Blitz:  -Group instruction provided by [employer], verbal discussion, and written materials to  support subject matter. The instructor gives an explanation and review of the physiology behind type 1 and type 2 diabetes, diabetes medications and rational behind using different medications, pre- and post-prandial blood glucose recommendations and Hemoglobin A1c goals, diabetes diet, and exercise including blood glucose guidelines for exercising safely.    Portion Distortion:  -Group instruction provided by PowerPoint slides, verbal discussion, written materials, and food models to support subject matter. The instructor gives an explanation of serving size versus portion size, changes in portions sizes over the last 20 years, and what consists of a serving from each food group.   Stress Management:  -Group instruction provided by verbal instruction, video, and written materials to support subject matter.  Instructors review role of stress in heart disease and how to cope with stress positively.      Exercising on Your Own:  -Group instruction provided by verbal instruction, power point, and written materials to support subject.  Instructors discuss benefits of exercise, components of exercise, frequency and intensity of exercise, and end points for exercise.  Also discuss use of nitroglycerin and activating EMS.  Review options of places to exercise outside of rehab.  Review guidelines for sex with heart disease.   Cardiac Drugs I:  -Group instruction provided by verbal instruction and written materials to support subject.  Instructor reviews cardiac drug classes: antiplatelets, anticoagulants, beta blockers, and statins.  Instructor discusses reasons, side effects, and lifestyle considerations for each drug class.   Cardiac Drugs II:  -Group instruction provided by verbal instruction and written materials to support subject.  Instructor reviews cardiac drug classes: angiotensin converting enzyme inhibitors (ACE-I), angiotensin II receptor blockers (ARBs), nitrates, and calcium channel blockers.  Instructor discusses reasons, side effects, and lifestyle considerations for each drug class.   Anatomy and Physiology of the Circulatory System:  Group verbal and written instruction and models provide basic cardiac anatomy and physiology, with the coronary electrical and arterial systems. Review of: AMI, Angina, Valve disease, Heart Failure, Peripheral Artery Disease, Cardiac Arrhythmia, Pacemakers, and the ICD.   Other Education:  -Group or individual verbal, written, or video instructions that support the educational goals of the cardiac rehab program.   Holiday Eating Survival Tips:  -Group instruction provided by PowerPoint slides, verbal discussion, and written materials to support subject matter. The instructor gives patients tips, tricks, and techniques to help them not only survive but enjoy the holidays despite the onslaught of food that accompanies the holidays.   Knowledge  Questionnaire Score:  Knowledge Questionnaire Score - 07/03/21 1109       Knowledge Questionnaire Score   Pre Score 21/24             Core Components/Risk Factors/Patient Goals at Admission:  Personal Goals and Risk Factors at Admission - 07/03/21 1003       Core Components/Risk Factors/Patient Goals on Admission    Weight Management Yes;Obesity;Weight Loss    Intervention Weight Management/Obesity: Establish reasonable short term and long term weight goals.;Obesity: Provide education and appropriate resources to help participant work on and attain dietary goals.    Admit Weight 213 lb 13.5 oz (97 kg)    Expected Outcomes Short Term: Continue to assess and modify interventions until short term weight is achieved;Weight Loss: Understanding of general recommendations for a balanced deficit meal plan, which promotes 1-2 lb weight loss per week and includes a negative energy balance of [phone] kcal/d    Lipids Yes    Intervention Provide education and support for participant on nutrition &  aerobic/resistive exercise along with prescribed medications to achieve LDL 70mg , HDL >40mg .    Expected Outcomes Short Term: Participant states understanding of desired cholesterol values and is compliant with medications prescribed. Participant is following exercise prescription and nutrition guidelines.;Long Term: Cholesterol controlled with medications as prescribed, with individualized exercise RX and with personalized nutrition plan. Value goals: LDL < 70mg , HDL > 40 mg.    Stress Yes    Intervention Offer individual and/or small group education and counseling on adjustment to heart disease, stress management and health-related lifestyle change. Teach and support self-help strategies.;Refer participants experiencing significant psychosocial distress to appropriate mental health specialists for further evaluation and treatment. When possible, include family members and significant others in  education/counseling sessions.    Expected Outcomes Short Term: Participant demonstrates changes in health-related behavior, relaxation and other stress management skills, ability to obtain effective social support, and compliance with psychotropic medications if prescribed.;Long Term: Emotional wellbeing is indicated by absence of clinically significant psychosocial distress or social isolation.    Personal Goal Other Yes    Personal Goal Get in better shape. Resume playing softball.    Intervention Provide individualized aerobic and resistance training plan to help improve cardiorespiratory fitness so that participant can be in better shape and return to leisure activities like softball.    Expected Outcomes Participant will have energy to resume playing softball and have increased functional capacity as measured by 6-minute walk test and self-report.             Core Components/Risk Factors/Patient Goals Review:    Core Components/Risk Factors/Patient Goals at Discharge (Final Review):    ITP Comments:  ITP Comments     Row Name 07/03/21 0957           ITP Comments Medical Director- Dr. Fransico Him, MD                Comments: Patient attended orientation for the cardiac rehabilitation program on 07/03/2021 to review rules and guidelines for the program. Completed 6-minute walk test, Initial ITP, and exercise prescription. Vital signs stable. Telemetry- Normal sinus rhythm, BBB, asymptomatic. Safety measures and social distancing in place per CDC guidelines

## 2021-07-03 NOTE — Progress Notes (Signed)
Cardiac Rehab Medication Review by a Nurse  Does the patient  feel that his/her medications are working for him/her?  yes  Has the patient been experiencing any side effects to the medications prescribed?  no  Does the patient measure his/her own blood pressure or blood glucose at home?  no   Does the patient have any problems obtaining medications due to transportation or finances?   no  Understanding of regimen: good Understanding of indications: good Potential of compliance: good    Nurse comments: Leroy Campbell is taking his medications as prescribed and has a good understanding of what his medications are for. Danny checks his blood pressures at home intermittently.    Christa See [facility] RN 07/03/2021 10:34 AM

## 2021-07-09 ENCOUNTER — Encounter (HOSPITAL_COMMUNITY)
Admission: RE | Admit: 2021-07-09 | Discharge: 2021-07-09 | Disposition: A | Discharge status: Home / Self Care | Payer: No Typology Code available for payment source | Source: Ambulatory Visit | Attending: Cardiology | Admitting: Cardiology

## 2021-07-09 ENCOUNTER — Other Ambulatory Visit: Payer: Self-pay

## 2021-07-09 DIAGNOSIS — Z955 Presence of coronary angioplasty implant and graft: Secondary | ICD-10-CM | POA: Insufficient documentation

## 2021-07-09 DIAGNOSIS — I2111 ST elevation (STEMI) myocardial infarction involving right coronary artery: Secondary | ICD-10-CM | POA: Insufficient documentation

## 2021-07-09 NOTE — Progress Notes (Signed)
Incomplete Session Note ? ?Patient Details  ?Name: Leroy Campbell. ?MRN: [id number] ?Date of Birth: 03/09/1961 ?Referring Provider:   ?Flowsheet Row CARDIAC REHAB PHASE II ORIENTATION from 07/03/2021 in [facility]  ?Referring Provider [name], MD  ? ?  ? ? ?[name]. did not complete his rehab session.  Kasandra Knudsen reported here for exercise at phase 2 cardiac rehab. Kasandra Knudsen said that his girlfriend accidentally cut his mole off his right forearm trying to shave the areas for his patches on his chest. Kasandra Knudsen said that the area has not stopped bleeding since she nicked the area. Patient had an elastic bandage that was saturated with blood under an elastic bandage. I removed the bandage and cleaned the area with soap and water. The small opening from continued to ooze blood. Area was redressed with a 4x4 and kerlix.  Cecilie Kicks NP was paged and notified. Mickel Baas said that the patient should follow up with his primary care provider or urgent care. Adrienne Mocha PA's office at [facility] called and notified. Appointment made for Mr Poch to follow up with a provider today at 3:50 pm. Kasandra Knudsen did not exercise today. Blood pressure 119/76 heart rate 67.Barnet Pall, RN,BSN ?07/09/2021 3:30 PM  ?

## 2021-07-11 ENCOUNTER — Telehealth (HOSPITAL_COMMUNITY): Payer: Self-pay | Admitting: Physician Assistant

## 2021-07-11 ENCOUNTER — Encounter (HOSPITAL_COMMUNITY): Payer: No Typology Code available for payment source

## 2021-07-13 ENCOUNTER — Encounter (HOSPITAL_COMMUNITY): Payer: No Typology Code available for payment source

## 2021-07-13 ENCOUNTER — Telehealth (HOSPITAL_COMMUNITY): Payer: Self-pay | Admitting: Physician Assistant

## 2021-07-16 ENCOUNTER — Other Ambulatory Visit: Payer: Self-pay

## 2021-07-16 ENCOUNTER — Encounter (HOSPITAL_COMMUNITY)
Admission: RE | Admit: 2021-07-16 | Discharge: 2021-07-16 | Disposition: A | Discharge status: Home / Self Care | Payer: No Typology Code available for payment source | Source: Ambulatory Visit | Attending: Cardiology | Admitting: Cardiology

## 2021-07-16 DIAGNOSIS — Z955 Presence of coronary angioplasty implant and graft: Secondary | ICD-10-CM

## 2021-07-16 DIAGNOSIS — I2111 ST elevation (STEMI) myocardial infarction involving right coronary artery: Secondary | ICD-10-CM

## 2021-07-16 NOTE — Progress Notes (Incomplete)
Cardiac Individual Treatment Plan  Patient Details  Name: Leroy Campbell. MRN: [id number] Date of Birth: 20-Oct-1960 Referring Provider:   Flowsheet Row CARDIAC REHAB PHASE II ORIENTATION from 07/03/2021 in [place]  Referring Provider [name], MD       Initial Encounter Date:  [place] from 07/03/2021 in [facility]  Date 07/03/21       Visit Diagnosis: 04/27/21 STEMI  04/27/21 DES RCA  Patient's Home Medications on Admission:  Current Outpatient Medications:    allopurinol (ZYLOPRIM) 100 MG tablet, Take 200 mg by mouth 2 (two) times daily., Disp: , Rfl:    aspirin 81 MG chewable tablet, Chew 1 tablet (81 mg total) by mouth daily., Disp: , Rfl:    atorvastatin (LIPITOR) 80 MG tablet, Take 1 tablet (80 mg total) by mouth daily., Disp: 30 tablet, Rfl: 11   clopidogrel (PLAVIX) 75 MG tablet, Take 1 tablet (75 mg total) by mouth daily. Take 2 tablets first day, then 1 daily (Patient taking differently: Take 75 mg by mouth daily.), Disp: 30 tablet, Rfl: 10   colchicine 0.6 MG tablet, Take 0.6 mg by mouth daily as needed (gout)., Disp: , Rfl:    losartan (COZAAR) 25 MG tablet, Take 1 tablet (25 mg total) by mouth daily., Disp: 30 tablet, Rfl: 11   metoprolol succinate (TOPROL-XL) 25 MG 24 hr tablet, Take 1 tablet (25 mg total) by mouth daily., Disp: 30 tablet, Rfl: 11   naproxen sodium (ALEVE) 220 MG tablet, Take 220 mg by mouth daily as needed (headache)., Disp: , Rfl:    nitroGLYCERIN (NITROSTAT) 0.4 MG SL tablet, Place 1 tablet (0.4 mg total) under the tongue every 5 (five) minutes x 3 doses as needed for chest pain., Disp: 25 tablet, Rfl: 12   vitamin B-12 (CYANOCOBALAMIN) 1000 MCG tablet, Take 1,000 mcg by mouth 3 (three) times a week., Disp: , Rfl:    vitamin C (ASCORBIC ACID) 500 MG tablet, Take 500 mg by mouth daily., Disp: , Rfl:   Past Medical History: Past  Medical History:  Diagnosis Date   Coronary artery disease    Gout    Hyperlipidemia    Ischemic cardiomyopathy    MI (myocardial infarction) ([place]) 04/27/2021    Tobacco Use: Social History   Tobacco Use  Smoking Status Never  Smokeless Tobacco Never    Labs: Recent Review Flowsheet Data     Labs for ITP Cardiac and Pulmonary Rehab Latest Ref Rng & Units 05/25/2013 07/19/2014 11/17/2015 04/27/2021 04/28/2021   Cholestrol 0 - 200 mg/dL 202(H) 222(H) 193 - 213(H)   LDLCALC 0 - 99 mg/dL - 152(H) 134(H) - 134(H)   LDLDIRECT mg/dL 142.0 - - - -   HDL >40 mg/dL 38.20(L) 46.10 35.50(L) - 41   Trlycerides <150 mg/dL 153.0(H) 122.0 118.0 - 191(H)   Hemoglobin A1c 4.8 - 5.6 % - - - 5.4 -       Capillary Blood Glucose: Lab Results  Component Value Date   GLUCAP 126 (H) 04/27/2021     Exercise Target Goals: Exercise Program Goal: Individual exercise prescription set using results from initial 6 min walk test and THRR while considering  patients activity barriers and safety.   Exercise Prescription Goal: Starting with aerobic activity 30 plus minutes a day, 3 days per week for initial exercise prescription. Provide home exercise prescription and guidelines that participant acknowledges understanding prior to discharge.  Activity Barriers &  Risk Stratification:  Activity Barriers & Cardiac Risk Stratification - 07/03/21 1010       Activity Barriers & Cardiac Risk Stratification   Activity Barriers Other (comment);History of Falls    Comments Patient has h/o 4 knee surgeries (2 each knee) and right shoulder surgery.    Cardiac Risk Stratification High             6 Minute Walk:  6 Minute Walk     Row Name 07/03/21 1039         6 Minute Walk   Phase Initial     Distance 1826 feet     Walk Time 6 minutes     # of Rest Breaks 0     MPH 3.46     METS 4.02     RPE 12     Perceived Dyspnea  0     VO2 Peak 14.06     Symptoms Yes (comment)     Comments Patient c/o  feeling tired.     Resting HR 65 bpm     Resting BP 108/70     Resting Oxygen Saturation  98 %     Exercise Oxygen Saturation  during 6 min walk 98 %     Max Ex. HR 83 bpm     Max Ex. BP 120/62     2 Minute Post BP 100/62              Oxygen Initial Assessment:   Oxygen Re-Evaluation:   Oxygen Discharge (Final Oxygen Re-Evaluation):   Initial Exercise Prescription:  Initial Exercise Prescription - 07/03/21 1100       Date of Initial Exercise RX and Referring Provider   Date 07/03/21    Referring Provider [name], MD    Expected Discharge Date 08/31/21      Treadmill   MPH 2.4    Grade 0    Minutes 15    METs 2.84      Bike   Level 2    Minutes 15    METs 3.5      Prescription Details   Frequency (times per week) 3    Duration Progress to 30 minutes of continuous aerobic without signs/symptoms of physical distress      Intensity   THRR 40-80% of Max Heartrate 64-129    Ratings of Perceived Exertion 11-13    Perceived Dyspnea 0-4      Progression   Progression Continue to progress workloads to maintain intensity without signs/symptoms of physical distress.      Resistance Training   Training Prescription Yes    Weight 4 lbs    Reps 10-15             Perform Capillary Blood Glucose checks as needed.  Exercise Prescription Changes:   Exercise Prescription Changes     Row Name 07/16/21 1626             Response to Exercise   Blood Pressure (Admit) 118/58       Blood Pressure (Exercise) 158/72       Blood Pressure (Exit) 104/60       Heart Rate (Admit) 69 bpm       Heart Rate (Exercise) 95 bpm       Heart Rate (Exit) 65 bpm       Rating of Perceived Exertion (Exercise) 11       Perceived Dyspnea (Exercise) 0       Symptoms 0  Comments Pt first day in the CRP2 program       Duration Progress to 30 minutes of  aerobic without signs/symptoms of physical distress       Intensity THRR unchanged         Progression    Progression Continue to progress workloads to maintain intensity without signs/symptoms of physical distress.       Average METs 3.17         Resistance Training   Training Prescription Yes       Weight 4 lbs       Reps 10-15       Time 10 Minutes         Treadmill   MPH 2.4       Grade 0       Minutes 15       METs 2.84         Bike   Level 2       Minutes 15       METs 3.5                Exercise Comments:   Exercise Comments     Row Name 07/16/21 1629           Exercise Comments Pt first day in the CRP2 program. Pt tolerated exercise well with an average MET level of 3.17. Pt is learning his THRR, RPE and ExRx. Off to a great start                Exercise Goals and Review:   Exercise Goals     Row Name 07/03/21 1010             Exercise Goals   Increase Physical Activity Yes       Intervention Provide advice, education, support and counseling about physical activity/exercise needs.;Develop an individualized exercise prescription for aerobic and resistive training based on initial evaluation findings, risk stratification, comorbidities and participant's personal goals.       Expected Outcomes Short Term: Attend rehab on a regular basis to increase amount of physical activity.;Long Term: Exercising regularly at least 3-5 days a week.;Long Term: Add in home exercise to make exercise part of routine and to increase amount of physical activity.       Increase Strength and Stamina Yes       Intervention Provide advice, education, support and counseling about physical activity/exercise needs.;Develop an individualized exercise prescription for aerobic and resistive training based on initial evaluation findings, risk stratification, comorbidities and participant's personal goals.       Expected Outcomes Short Term: Increase workloads from initial exercise prescription for resistance, speed, and METs.;Short Term: Perform resistance training exercises routinely  during rehab and add in resistance training at home;Long Term: Improve cardiorespiratory fitness, muscular endurance and strength as measured by increased METs and functional capacity (6MWT)       Able to understand and use rate of perceived exertion (RPE) scale Yes       Intervention Provide education and explanation on how to use RPE scale       Expected Outcomes Short Term: Able to use RPE daily in rehab to express subjective intensity level;Long Term:  Able to use RPE to guide intensity level when exercising independently       Knowledge and understanding of Target Heart Rate Range (THRR) Yes       Intervention Provide education and explanation of THRR including how the numbers were predicted and where they are located  for reference       Expected Outcomes Short Term: Able to state/look up THRR;Long Term: Able to use THRR to govern intensity when exercising independently;Short Term: Able to use daily as guideline for intensity in rehab       Able to check pulse independently Yes       Intervention Provide education and demonstration on how to check pulse in carotid and radial arteries.;Review the importance of being able to check your own pulse for safety during independent exercise       Expected Outcomes Short Term: Able to explain why pulse checking is important during independent exercise;Long Term: Able to check pulse independently and accurately       Understanding of Exercise Prescription Yes       Intervention Provide education, explanation, and written materials on patient's individual exercise prescription       Expected Outcomes Short Term: Able to explain program exercise prescription;Long Term: Able to explain home exercise prescription to exercise independently                Exercise Goals Re-Evaluation :  Exercise Goals Re-Evaluation     Row Name 07/16/21 1632             Exercise Goal Re-Evaluation   Exercise Goals Review Increase Physical Activity;Increase Strength  and Stamina;Able to understand and use rate of perceived exertion (RPE) scale;Knowledge and understanding of Target Heart Rate Range (THRR);Understanding of Exercise Prescription       Comments Pt first day in the CRP2 program. Pt tolerated exercise well with an average MET level of 3.17. Pt is learning his THRR, RPE and ExRx. Off to a great start       Expected Outcomes Will continue to monitor pt and progress workloads as tolerated                 Discharge Exercise Prescription (Final Exercise Prescription Changes):  Exercise Prescription Changes - 07/16/21 1626       Response to Exercise   Blood Pressure (Admit) 118/58    Blood Pressure (Exercise) 158/72    Blood Pressure (Exit) 104/60    Heart Rate (Admit) 69 bpm    Heart Rate (Exercise) 95 bpm    Heart Rate (Exit) 65 bpm    Rating of Perceived Exertion (Exercise) 11    Perceived Dyspnea (Exercise) 0    Symptoms 0    Comments Pt first day in the CRP2 program    Duration Progress to 30 minutes of  aerobic without signs/symptoms of physical distress    Intensity THRR unchanged      Progression   Progression Continue to progress workloads to maintain intensity without signs/symptoms of physical distress.    Average METs 3.17      Resistance Training   Training Prescription Yes    Weight 4 lbs    Reps 10-15    Time 10 Minutes      Treadmill   MPH 2.4    Grade 0    Minutes 15    METs 2.84      Bike   Level 2    Minutes 15    METs 3.5             Nutrition:  Target Goals: Understanding of nutrition guidelines, daily intake of sodium <1594m, cholesterol <204m calories 30% from fat and 7% or less from saturated fats, daily to have 5 or more servings of fruits and vegetables.  Biometrics:  Pre Biometrics - 07/03/21  0957       Pre Biometrics   Waist Circumference 42 inches    Hip Circumference 41 inches    Waist to Hip Ratio 1.02 %    Triceps Skinfold 12.5 mm    % Body Fat 28.4 %    Grip Strength 42  kg    Flexibility 10.5 in    Single Leg Stand 30 seconds              Nutrition Therapy Plan and Nutrition Goals:   Nutrition Assessments:  MEDIFICTS Score Key: ?70 Need to make dietary changes  40-70 Heart Healthy Diet ? 40 Therapeutic Level Cholesterol Diet   Picture Your Plate Scores: <19 Unhealthy dietary pattern with much room for improvement. 41-50 Dietary pattern unlikely to meet recommendations for good health and room for improvement. 51-60 More healthful dietary pattern, with some room for improvement.  >60 Healthy dietary pattern, although there may be some specific behaviors that could be improved.    Nutrition Goals Re-Evaluation:   Nutrition Goals Discharge (Final Nutrition Goals Re-Evaluation):   Psychosocial: Target Goals: Acknowledge presence or absence of significant depression and/or stress, maximize coping skills, provide positive support system. Participant is able to verbalize types and ability to use techniques and skills needed for reducing stress and depression.  Initial Review & Psychosocial Screening:  Initial Psych Review & Screening - 07/03/21 1226       Initial Review   Current issues with Current Stress Concerns    Source of Stress Concerns Family    Comments Danny's mother has Dementia. Kasandra Knudsen helps to take care of her as she has memory issues      [place]? Yes   Kasandra Knudsen has his girlfriend for support.   Concerns Inappropriate over/under dependence on family/friends   Kasandra Knudsen lives with his girlfriend, mother who has dementia and step father     Barriers   Psychosocial barriers to participate in program The patient should benefit from training in stress management and relaxation.      Screening Interventions   Interventions Encouraged to exercise;To provide support and resources with identified psychosocial needs    Expected Outcomes Long Term Goal: Stressors or current issues are controlled or  eliminated.             Quality of Life Scores:  Quality of Life - 07/03/21 1124       Quality of Life   Select Quality of Life      Quality of Life Scores   Health/Function Pre 21.27 %    Socioeconomic Pre 21 %    Psych/Spiritual Pre 22.71 %    Family Pre 23.7 %    GLOBAL Pre 21.87 %            Scores of 19 and below usually indicate a poorer quality of life in these areas.  A difference of  2-3 points is a clinically meaningful difference.  A difference of 2-3 points in the total score of the Quality of Life Index has been associated with significant improvement in overall quality of life, self-image, physical symptoms, and general health in studies assessing change in quality of life.  PHQ-9: Recent Review Flowsheet Data     Depression screen [facility] 2/9 07/03/2021 11/17/2015 02/18/2013   Decreased Interest 0 0 0   Down, Depressed, Hopeless 0 0 0   PHQ - 2 Score 0 0 0      Interpretation of Total Score  Total Score Depression  Severity:  1-4 = Minimal depression, 5-9 = Mild depression, 10-14 = Moderate depression, 15-19 = Moderately severe depression, 20-27 = Severe depression   Psychosocial Evaluation and Intervention:   Psychosocial Re-Evaluation:  Psychosocial Re-Evaluation     [place] Name 07/16/21 1707             Psychosocial Re-Evaluation   Current issues with Current Stress Concerns       Comments Kasandra Knudsen continues to have stress concerns as he takes care of his mother adn works       Expected Outcomes Kasandra Knudsen will have decreased stressors upon completion of phase 2 cardiac rehab.       Interventions Encouraged to attend Cardiac Rehabilitation for the exercise;Relaxation education;Stress management education       Continue Psychosocial Services  Follow up required by staff         Initial Review   Source of Stress Concerns Family       Comments Will continue to monitor and offer support as needed                Psychosocial Discharge (Final  Psychosocial Re-Evaluation):  Psychosocial Re-Evaluation - 07/16/21 1707       Psychosocial Re-Evaluation   Current issues with Current Stress Concerns    Comments Kasandra Knudsen continues to have stress concerns as he takes care of his mother adn works    Expected Outcomes Kasandra Knudsen will have decreased stressors upon completion of phase 2 cardiac rehab.    Interventions Encouraged to attend Cardiac Rehabilitation for the exercise;Relaxation education;Stress management education    Continue Psychosocial Services  Follow up required by staff      Initial Review   Source of Stress Concerns Family    Comments Will continue to monitor and offer support as needed             Vocational Rehabilitation: Provide vocational rehab assistance to qualifying candidates.   Vocational Rehab Evaluation & Intervention:  Vocational Rehab - 07/03/21 1231       Initial Vocational Rehab Evaluation & Intervention   Assessment shows need for Vocational Rehabilitation No   Kasandra Knudsen works full time and does not need vocational rehab at this time            Education: Education Goals: Education classes will be provided on a weekly basis, covering required topics. Participant will state understanding/return demonstration of topics presented.  Learning Barriers/Preferences:  Learning Barriers/Preferences - 07/03/21 1230       Learning Barriers/Preferences   Learning Barriers Sight   wears contacts, glasses   Learning Preferences Skilled Demonstration;Pictoral;Written Material             Education Topics: Hypertension, Hypertension Reduction -Define heart disease and high blood pressure. Discus how high blood pressure affects the body and ways to reduce high blood pressure.   Exercise and Your Heart -Discuss why it is important to exercise, the FITT principles of exercise, normal and abnormal responses to exercise, and how to exercise safely.   Angina -Discuss definition of angina, causes of  angina, treatment of angina, and how to decrease risk of having angina.   Cardiac Medications -Review what the following cardiac medications are used for, how they affect the body, and side effects that may occur when taking the medications.  Medications include Aspirin, Beta blockers, calcium channel blockers, ACE Inhibitors, angiotensin receptor blockers, diuretics, digoxin, and antihyperlipidemics.   Congestive Heart Failure -Discuss the definition of CHF, how to live with CHF, the signs and symptoms  of CHF, and how keep track of weight and sodium intake.   Heart Disease and Intimacy -Discus the effect sexual activity has on the heart, how changes occur during intimacy as we age, and safety during sexual activity.   Smoking Cessation / COPD -Discuss different methods to quit smoking, the health benefits of quitting smoking, and the definition of COPD.   Nutrition I: Fats -Discuss the types of cholesterol, what cholesterol does to the heart, and how cholesterol levels can be controlled.   Nutrition II: Labels -Discuss the different components of food labels and how to read food label   Heart Parts/Heart Disease and PAD -Discuss the anatomy of the heart, the pathway of blood circulation through the heart, and these are affected by heart disease.   Stress I: Signs and Symptoms -Discuss the causes of stress, how stress may lead to anxiety and depression, and ways to limit stress.   Stress II: Relaxation -Discuss different types of relaxation techniques to limit stress.   Warning Signs of Stroke / TIA -Discuss definition of a stroke, what the signs and symptoms are of a stroke, and how to identify when someone is having stroke.   Knowledge Questionnaire Score:  Knowledge Questionnaire Score - 07/03/21 1109       Knowledge Questionnaire Score   Pre Score 21/24             Core Components/Risk Factors/Patient Goals at Admission:  Personal Goals and Risk Factors at  Admission - 07/03/21 1003       Core Components/Risk Factors/Patient Goals on Admission    Weight Management Yes;Obesity;Weight Loss    Intervention Weight Management/Obesity: Establish reasonable short term and long term weight goals.;Obesity: Provide education and appropriate resources to help participant work on and attain dietary goals.    Admit Weight 213 lb 13.5 oz (97 kg)    Expected Outcomes Short Term: Continue to assess and modify interventions until short term weight is achieved;Weight Loss: Understanding of general recommendations for a balanced deficit meal plan, which promotes 1-2 lb weight loss per week and includes a negative energy balance of [id number] kcal/d    Lipids Yes    Intervention Provide education and support for participant on nutrition & aerobic/resistive exercise along with prescribed medications to achieve LDL <40m, HDL >454m    Expected Outcomes Short Term: Participant states understanding of desired cholesterol values and is compliant with medications prescribed. Participant is following exercise prescription and nutrition guidelines.;Long Term: Cholesterol controlled with medications as prescribed, with individualized exercise RX and with personalized nutrition plan. Value goals: LDL < 7074mHDL > 40 mg.    Stress Yes    Intervention Offer individual and/or small group education and counseling on adjustment to heart disease, stress management and health-related lifestyle change. Teach and support self-help strategies.;Refer participants experiencing significant psychosocial distress to appropriate mental health specialists for further evaluation and treatment. When possible, include family members and significant others in education/counseling sessions.    Expected Outcomes Short Term: Participant demonstrates changes in health-related behavior, relaxation and other stress management skills, ability to obtain effective social support, and compliance with psychotropic  medications if prescribed.;Long Term: Emotional wellbeing is indicated by absence of clinically significant psychosocial distress or social isolation.    Personal Goal Other Yes    Personal Goal Get in better shape. Resume playing softball.    Intervention Provide individualized aerobic and resistance training plan to help improve cardiorespiratory fitness so that participant can be in better shape and return to leisure  activities like softball.    Expected Outcomes Participant will have energy to resume playing softball and have increased functional capacity as measured by 6-minute walk test and self-report.             Core Components/Risk Factors/Patient Goals Review:   Goals and Risk Factor Review     Row Name 07/16/21 1710             Core Components/Risk Factors/Patient Goals Review   Personal Goals Review Weight Management/Obesity;Lipids;Stress       Review Danny started cardiac rehab on 07/16/21. Danny did well with exercise, vital signs were stable       Expected Outcomes Kasandra Knudsen will continue to participate in phase 2 cardiac rehab for exercise, nutrition and lifestyle modifications                Core Components/Risk Factors/Patient Goals at Discharge (Final Review):   Goals and Risk Factor Review - 07/16/21 1710       Core Components/Risk Factors/Patient Goals Review   Personal Goals Review Weight Management/Obesity;Lipids;Stress    Review Danny started cardiac rehab on 07/16/21. Danny did well with exercise, vital signs were stable    Expected Outcomes Kasandra Knudsen will continue to participate in phase 2 cardiac rehab for exercise, nutrition and lifestyle modifications             ITP Comments:  ITP Comments     Row Name 07/03/21 0957 07/16/21 1706         ITP Comments Medical Director- Dr. Fransico Him, MD Danny started cardiac rehab on 07/16/21. Danny did well with exercise.               Comments: See ITP comments.Harrell Gave RN BSN

## 2021-07-16 NOTE — Progress Notes (Signed)
Daily Session Note ? ?Patient Details  ?Name: Leroy Campbell. ?MRN: [id number] ?Date of Birth: May 21, 1960 ?Referring Provider:   ?Flowsheet Row CARDIAC REHAB PHASE II ORIENTATION from 07/03/2021 in [place]  ?Referring Provider [name], MD  ? ?  ? ? ?Encounter Date: 07/16/2021 ? ?Check In: ? Session Check In - 07/16/21 1513   ? ?  ? Check-In  ? Supervising physician immediately available to respond to emergencies [facility] immediately available   ? Physician(s) Dr. Theone Murdoch   ? Location [facility]   ? Staff Present Lesly Rubenstein, MS, ACSM-CEP, CCRP, Exercise Physiologist;Olinty Celesta Aver, MS, ACSM CEP, Exercise Physiologist;Arlissa Monteverde, RN, BSN;Jetta Walker BS, ACSM EP-C, Exercise Physiologist   ? Virtual Visit No   ? Medication changes reported     No   ? Fall or balance concerns reported    No   ? Tobacco Cessation No Change   ? Warm-up and Cool-down Performed as group-led instruction   ? Resistance Training Performed Yes   ? VAD Patient? No   ? PAD/SET Patient? No   ?  ? Pain Assessment  ? Currently in Pain? No/denies   ? Pain Score 0-No pain   ? Multiple Pain Sites No   ? ?  ?  ? ?  ? ? ?Capillary Blood Glucose: ?No results found for this or any previous visit (from the past 24 hour(s)). ? ? Exercise Prescription Changes - 07/16/21 1626   ? ?  ? Response to Exercise  ? Blood Pressure (Admit) 118/58   ? Blood Pressure (Exercise) 158/72   ? Blood Pressure (Exit) 104/60   ? Heart Rate (Admit) 69 bpm   ? Heart Rate (Exercise) 95 bpm   ? Heart Rate (Exit) 65 bpm   ? Rating of Perceived Exertion (Exercise) 11   ? Perceived Dyspnea (Exercise) 0   ? Symptoms 0   ? Comments Pt first day in the CRP2 program   ? Duration Progress to 30 minutes of  aerobic without signs/symptoms of physical distress   ? Intensity THRR unchanged   ?  ? Progression  ? Progression Continue to progress workloads to maintain intensity without signs/symptoms of physical  distress.   ? Average METs 3.17   ?  ? Resistance Training  ? Training Prescription Yes   ? Weight 4 lbs   ? Reps 10-15   ? Time 10 Minutes   ?  ? Treadmill  ? MPH 2.4   ? Grade 0   ? Minutes 15   ? METs 2.84   ?  ? Bike  ? Level 2   ? Minutes 15   ? METs 3.5   ? ?  ?  ? ?  ? ? ?Social History  ? ?Tobacco Use  ?Smoking Status Never  ?Smokeless Tobacco Never  ? ? ?Goals Met:  ?Exercise tolerated well ?No report of concerns or symptoms today ?Strength training completed today ? ?Goals Unmet:  ?Not Applicable ? ?Comments: Kasandra Knudsen started cardiac rehab today.  Pt tolerated light exercise without difficulty. VSS, telemetry-Sinus Rhythm, bundle branch block, asymptomatic.  Medication list reconciled. Pt denies barriers to medicaiton compliance.  PSYCHOSOCIAL ASSESSMENT:  PHQ-0. Pt exhibits positive coping skills, hopeful outlook with supportive family. No psychosocial needs identified at this time, no psychosocial interventions necessary.    Pt enjoys softball, golf and racquetball.   Pt oriented to exercise equipment and routine.    Understanding verbalized. Harrell Gave RN BSN  ? ? ?  Dr. Fransico Him is Medical Director for Cardiac Rehab at [facility]. ?

## 2021-07-18 ENCOUNTER — Other Ambulatory Visit: Payer: Self-pay

## 2021-07-18 ENCOUNTER — Encounter (HOSPITAL_COMMUNITY)
Admission: RE | Admit: 2021-07-18 | Discharge: 2021-07-18 | Disposition: A | Discharge status: Home / Self Care | Payer: No Typology Code available for payment source | Source: Ambulatory Visit | Attending: Cardiology | Admitting: Cardiology

## 2021-07-18 DIAGNOSIS — I2111 ST elevation (STEMI) myocardial infarction involving right coronary artery: Secondary | ICD-10-CM | POA: Diagnosis not present

## 2021-07-18 DIAGNOSIS — Z955 Presence of coronary angioplasty implant and graft: Secondary | ICD-10-CM

## 2021-07-20 ENCOUNTER — Encounter (HOSPITAL_COMMUNITY)
Admission: RE | Admit: 2021-07-20 | Discharge: 2021-07-20 | Disposition: A | Discharge status: Home / Self Care | Payer: No Typology Code available for payment source | Source: Ambulatory Visit | Attending: Cardiology | Admitting: Cardiology

## 2021-07-20 ENCOUNTER — Other Ambulatory Visit: Payer: Self-pay

## 2021-07-20 DIAGNOSIS — I2111 ST elevation (STEMI) myocardial infarction involving right coronary artery: Secondary | ICD-10-CM

## 2021-07-20 DIAGNOSIS — Z955 Presence of coronary angioplasty implant and graft: Secondary | ICD-10-CM

## 2021-07-23 ENCOUNTER — Other Ambulatory Visit: Payer: Self-pay

## 2021-07-23 ENCOUNTER — Encounter (HOSPITAL_COMMUNITY)
Admission: RE | Admit: 2021-07-23 | Discharge: 2021-07-23 | Disposition: A | Discharge status: Home / Self Care | Payer: No Typology Code available for payment source | Source: Ambulatory Visit | Attending: Cardiology | Admitting: Cardiology

## 2021-07-23 DIAGNOSIS — I2111 ST elevation (STEMI) myocardial infarction involving right coronary artery: Secondary | ICD-10-CM | POA: Diagnosis not present

## 2021-07-23 DIAGNOSIS — Z955 Presence of coronary angioplasty implant and graft: Secondary | ICD-10-CM

## 2021-07-25 ENCOUNTER — Encounter (HOSPITAL_COMMUNITY)
Admission: RE | Admit: 2021-07-25 | Discharge: 2021-07-25 | Disposition: A | Discharge status: Home / Self Care | Payer: No Typology Code available for payment source | Source: Ambulatory Visit | Attending: Cardiology | Admitting: Cardiology

## 2021-07-25 ENCOUNTER — Other Ambulatory Visit: Payer: Self-pay

## 2021-07-25 DIAGNOSIS — I2111 ST elevation (STEMI) myocardial infarction involving right coronary artery: Secondary | ICD-10-CM

## 2021-07-25 DIAGNOSIS — Z955 Presence of coronary angioplasty implant and graft: Secondary | ICD-10-CM

## 2021-07-27 ENCOUNTER — Other Ambulatory Visit: Payer: Self-pay

## 2021-07-27 ENCOUNTER — Encounter (HOSPITAL_COMMUNITY)
Admission: RE | Admit: 2021-07-27 | Discharge: 2021-07-27 | Disposition: A | Discharge status: Home / Self Care | Payer: No Typology Code available for payment source | Source: Ambulatory Visit | Attending: Cardiology | Admitting: Cardiology

## 2021-07-27 ENCOUNTER — Ambulatory Visit (HOSPITAL_BASED_OUTPATIENT_CLINIC_OR_DEPARTMENT_OTHER): Payer: No Typology Code available for payment source | Attending: Cardiology | Admitting: Cardiovascular Disease

## 2021-07-27 DIAGNOSIS — I2111 ST elevation (STEMI) myocardial infarction involving right coronary artery: Secondary | ICD-10-CM

## 2021-07-27 DIAGNOSIS — G4733 Obstructive sleep apnea (adult) (pediatric): Secondary | ICD-10-CM | POA: Diagnosis not present

## 2021-07-27 DIAGNOSIS — I2510 Atherosclerotic heart disease of native coronary artery without angina pectoris: Secondary | ICD-10-CM

## 2021-07-27 DIAGNOSIS — G4736 Sleep related hypoventilation in conditions classified elsewhere: Secondary | ICD-10-CM | POA: Diagnosis not present

## 2021-07-27 DIAGNOSIS — Z955 Presence of coronary angioplasty implant and graft: Secondary | ICD-10-CM

## 2021-07-30 ENCOUNTER — Other Ambulatory Visit: Payer: Self-pay

## 2021-07-30 ENCOUNTER — Encounter (HOSPITAL_COMMUNITY)
Admission: RE | Admit: 2021-07-30 | Discharge: 2021-07-30 | Disposition: A | Discharge status: Home / Self Care | Payer: No Typology Code available for payment source | Source: Ambulatory Visit | Attending: Cardiology | Admitting: Cardiology

## 2021-07-30 DIAGNOSIS — I2111 ST elevation (STEMI) myocardial infarction involving right coronary artery: Secondary | ICD-10-CM

## 2021-07-30 DIAGNOSIS — Z955 Presence of coronary angioplasty implant and graft: Secondary | ICD-10-CM

## 2021-08-01 ENCOUNTER — Telehealth (HOSPITAL_COMMUNITY): Payer: Self-pay

## 2021-08-01 ENCOUNTER — Encounter (HOSPITAL_COMMUNITY)
Admission: RE | Admit: 2021-08-01 | Discharge: 2021-08-01 | Disposition: A | Discharge status: Home / Self Care | Payer: No Typology Code available for payment source | Source: Ambulatory Visit | Attending: Cardiology | Admitting: Cardiology

## 2021-08-01 DIAGNOSIS — Z955 Presence of coronary angioplasty implant and graft: Secondary | ICD-10-CM

## 2021-08-01 DIAGNOSIS — I2111 ST elevation (STEMI) myocardial infarction involving right coronary artery: Secondary | ICD-10-CM | POA: Diagnosis not present

## 2021-08-01 NOTE — Telephone Encounter (Signed)
-----  Message from [name], MD sent at 07/29/2021  1:18 PM EDT ----- ?Regarding: RE: Jog Increase Request ?Yes.  I agree ? ?----- Message ----- ?From: Esmeralda Links S ?Sent: 07/27/2021  11:20 AM EDT ?To: [name], MD ?Subject: Jog Increase Request                          ? ?CARDIAC REHAB PHASE 2 ? ?Your patient Leroy Campbell. has been in the cardiac rehabilitation program approximately 2 weeks and is doing well. Patient would like to begin jogging. His current MET level is 4.65. Please indicate if you are agreeable to this change in exercise prescription. ? ?We appreciate your assistance and referral of your patient to our program. ? ?Leroy Campbell ACSM-CEP ?07/27/2021 ?11:15 AM ? ? ? ?

## 2021-08-02 ENCOUNTER — Encounter (HOSPITAL_BASED_OUTPATIENT_CLINIC_OR_DEPARTMENT_OTHER): Payer: Self-pay | Admitting: Cardiovascular Disease

## 2021-08-02 NOTE — Procedures (Signed)
? ? ? ? ?  Patient Name: Leroy Campbell, Leroy Campbell ?Study Date: 07/28/2021 ?Gender: Male ?D.O.B: 12-09-1960 ?Age (years): 89 ?Referring Provider: [name] ?Height (inches): 72 ?Interpreting Physician: Shelva Majestic MD, ABSM ?Weight (lbs): 208 ?RPSGT: Jacolyn Reedy ?BMI: 28 ?MRN: [id number] ?Neck Size: 17.00 ? ?CLINICAL INFORMATION ?Sleep Study Type: HST ? ?Indication for sleep study: snoring, fatigue ? ?Epworth Sleepiness Score: elevated per referring provider ? ?SLEEP STUDY TECHNIQUE ?A multi-channel overnight portable sleep study was performed. The channels recorded were: nasal airflow, thoracic respiratory movement, and oxygen saturation with a pulse oximetry. Snoring was also monitored. ? ?MEDICATIONS ?allopurinol (ZYLOPRIM) 100 MG tablet ?aspirin 81 MG chewable tablet ?atorvastatin (LIPITOR) 80 MG tablet ?clopidogrel (PLAVIX) 75 MG tablet ?colchicine 0.6 MG tablet ?losartan (COZAAR) 25 MG tablet ?metoprolol succinate (TOPROL-XL) 25 MG 24 hr tablet ?naproxen sodium (ALEVE) 220 MG tablet ?nitroGLYCERIN (NITROSTAT) 0.4 MG SL tablet ?vitamin B-12 (CYANOCOBALAMIN) 1000 MCG tablet ?vitamin C (ASCORBIC ACID) 500 MG tablet ?Patient self administered medications include: N/A. ? ?SLEEP ARCHITECTURE ?Patient was studied for 364.5 minutes. The sleep efficiency was 100.0 % and the patient was supine for 24.2%. The arousal index was 0.0 per hour. ? ?RESPIRATORY PARAMETERS ?The overall AHI was 25.0 per hour, with a central apnea index of 0 per hour. ? ?The oxygen nadir was 83% during sleep. ? ?CARDIAC DATA ?Mean heart rate during sleep was 56.0 bpm. The lowest HR was 46 and fastest 92 bpm. ? ?IMPRESSIONS ?- Moderate obstructive sleep apnea occurred during this study (AHI 25.0/h). ?- Moderate oxygen desaturation to a nadir of 83%. ?- Patient snored for 200.3 minutes (55%) during the sleep. ? ?DIAGNOSIS ?- Obstructive Sleep Apnea (G47.33) ?- Nocturnal Hypoxemia (G47.36) ? ?RECOMMENDATIONS ?- In this patient with significant  cardiovascular co-morbidities recommend therapeutic CPAP for treatment of his sleep disordered breathing. If unable to obtain an in-lab titration, initiate Auto-PAP with EPR of 3 at 7 - 18 cm of water. ?- Effort should be made to optimize nasal and oropharyngeal patency. ?- Avoid alcohol, sedatives and other CNS depressants that may worsen sleep apnea and disrupt normal sleep architecture. ?- Sleep hygiene should be reviewed to assess factors that may improve sleep quality. ?- Weight management and regular exercise should be initiated or continued. ?- Recommend a download and sleep clinic after one month of therapy.  ? ? ?[Electronically signed] 08/02/2021 08:51 AM ? ?Shelva Majestic MD, [facility], ABSM ?[place], [place] Board of Sleep Medicine ? ? ?NPI: [id number] ? ?[place] ?PH: [phone]   FX: [phone] ?ACCREDITED BY THE AMERICAN ACADEMY OF SLEEP MEDICINE ? ?

## 2021-08-03 ENCOUNTER — Encounter (HOSPITAL_COMMUNITY)
Admission: RE | Admit: 2021-08-03 | Discharge: 2021-08-03 | Disposition: A | Discharge status: Home / Self Care | Payer: No Typology Code available for payment source | Source: Ambulatory Visit | Attending: Cardiology | Admitting: Cardiology

## 2021-08-03 ENCOUNTER — Encounter (HOSPITAL_COMMUNITY): Payer: No Typology Code available for payment source

## 2021-08-03 DIAGNOSIS — Z955 Presence of coronary angioplasty implant and graft: Secondary | ICD-10-CM

## 2021-08-03 DIAGNOSIS — I2111 ST elevation (STEMI) myocardial infarction involving right coronary artery: Secondary | ICD-10-CM

## 2021-08-06 ENCOUNTER — Encounter (HOSPITAL_COMMUNITY)
Admission: RE | Admit: 2021-08-06 | Discharge: 2021-08-06 | Disposition: A | Discharge status: Home / Self Care | Payer: No Typology Code available for payment source | Source: Ambulatory Visit | Attending: Cardiology | Admitting: Cardiology

## 2021-08-06 DIAGNOSIS — Z955 Presence of coronary angioplasty implant and graft: Secondary | ICD-10-CM | POA: Diagnosis not present

## 2021-08-06 DIAGNOSIS — I2111 ST elevation (STEMI) myocardial infarction involving right coronary artery: Secondary | ICD-10-CM | POA: Diagnosis present

## 2021-08-06 NOTE — Progress Notes (Signed)
CARDIAC REHAB PHASE 2 ? ?Reviewed home exercise with pt today. Pt is tolerating exercise well. Pt will continue to exercise on his own by using his stationary bike, treadmill and walking for 30-45 minutes per session 3-4 days a week in addition to the 3 days in CRP2. Advised pt on THRR, RPE scale, hydration and temperature/humidity precautions. Reinforced NTG use, S/S to stop exercise and when to call MD vs 911. Encouraged warm up cool down and stretches with exercise sessions. Pt verbalized understanding, all questions were answered and pt was given a copy to take home.  ?  ?Leroy Campbell ACSM-CEP ?08/06/2021 ?4:45 PM ? ?

## 2021-08-08 ENCOUNTER — Encounter (HOSPITAL_COMMUNITY)
Admission: RE | Admit: 2021-08-08 | Discharge: 2021-08-08 | Disposition: A | Discharge status: Home / Self Care | Payer: No Typology Code available for payment source | Source: Ambulatory Visit | Attending: Cardiology | Admitting: Cardiology

## 2021-08-08 DIAGNOSIS — I2111 ST elevation (STEMI) myocardial infarction involving right coronary artery: Secondary | ICD-10-CM | POA: Diagnosis not present

## 2021-08-08 DIAGNOSIS — Z955 Presence of coronary angioplasty implant and graft: Secondary | ICD-10-CM

## 2021-08-09 ENCOUNTER — Other Ambulatory Visit: Payer: Self-pay | Admitting: Cardiovascular Disease

## 2021-08-09 ENCOUNTER — Telehealth: Payer: Self-pay | Admitting: *Deleted

## 2021-08-09 DIAGNOSIS — G4733 Obstructive sleep apnea (adult) (pediatric): Secondary | ICD-10-CM

## 2021-08-09 DIAGNOSIS — G4736 Sleep related hypoventilation in conditions classified elsewhere: Secondary | ICD-10-CM

## 2021-08-09 NOTE — Telephone Encounter (Signed)
-----   Message from Troy Sine, MD sent at 08/02/2021  8:57 AM EDT ----- ?Mariann Laster please notify pt and set up CPAP titration/Auto-PAP ?

## 2021-08-09 NOTE — Telephone Encounter (Signed)
Patient informed of sleep study results and recommendations. He agrees to proceed with CPAP therapy. ?

## 2021-08-10 ENCOUNTER — Ambulatory Visit: Payer: No Typology Code available for payment source | Admitting: Registered"

## 2021-08-10 ENCOUNTER — Encounter (HOSPITAL_COMMUNITY): Payer: No Typology Code available for payment source

## 2021-08-13 ENCOUNTER — Encounter (HOSPITAL_COMMUNITY)
Admission: RE | Admit: 2021-08-13 | Discharge: 2021-08-13 | Disposition: A | Discharge status: Home / Self Care | Payer: No Typology Code available for payment source | Source: Ambulatory Visit | Attending: Cardiology | Admitting: Cardiology

## 2021-08-13 DIAGNOSIS — I2111 ST elevation (STEMI) myocardial infarction involving right coronary artery: Secondary | ICD-10-CM | POA: Diagnosis not present

## 2021-08-13 DIAGNOSIS — Z955 Presence of coronary angioplasty implant and graft: Secondary | ICD-10-CM

## 2021-08-15 ENCOUNTER — Encounter (HOSPITAL_COMMUNITY)
Admission: RE | Admit: 2021-08-15 | Discharge: 2021-08-15 | Disposition: A | Discharge status: Home / Self Care | Payer: No Typology Code available for payment source | Source: Ambulatory Visit | Attending: Cardiology | Admitting: Cardiology

## 2021-08-15 DIAGNOSIS — I2111 ST elevation (STEMI) myocardial infarction involving right coronary artery: Secondary | ICD-10-CM

## 2021-08-15 DIAGNOSIS — Z955 Presence of coronary angioplasty implant and graft: Secondary | ICD-10-CM

## 2021-08-15 NOTE — Progress Notes (Signed)
Cardiac Individual Treatment Plan ? ?Patient Details  ?Name: Leroy Campbell. ?MRN: [id number] ?Date of Birth: Jun 12, 1960 ?Referring Provider:   ?Flowsheet Row CARDIAC REHAB PHASE II ORIENTATION from 07/03/2021 in [place]  ?Referring Provider [name], MD  ? ?  ? ? ?Initial Encounter Date:  ?Flowsheet Row CARDIAC REHAB PHASE II ORIENTATION from 07/03/2021 in [place]  ?Date 07/03/21  ? ?  ? ? ?Visit Diagnosis: 04/27/21 STEMI ? ?04/27/21 DES RCA ? ?Patient's Home Medications on Admission: ? ?Current Outpatient Medications:  ?  allopurinol (ZYLOPRIM) 100 MG tablet, Take 200 mg by mouth 2 (two) times daily., Disp: , Rfl:  ?  aspirin 81 MG chewable tablet, Chew 1 tablet (81 mg total) by mouth daily., Disp: , Rfl:  ?  atorvastatin (LIPITOR) 80 MG tablet, Take 1 tablet (80 mg total) by mouth daily., Disp: 30 tablet, Rfl: 11 ?  clopidogrel (PLAVIX) 75 MG tablet, Take 1 tablet (75 mg total) by mouth daily. Take 2 tablets first day, then 1 daily (Patient taking differently: Take 75 mg by mouth daily.), Disp: 30 tablet, Rfl: 10 ?  colchicine 0.6 MG tablet, Take 0.6 mg by mouth daily as needed (gout)., Disp: , Rfl:  ?  losartan (COZAAR) 25 MG tablet, Take 1 tablet (25 mg total) by mouth daily., Disp: 30 tablet, Rfl: 11 ?  metoprolol succinate (TOPROL-XL) 25 MG 24 hr tablet, Take 1 tablet (25 mg total) by mouth daily., Disp: 30 tablet, Rfl: 11 ?  naproxen sodium (ALEVE) 220 MG tablet, Take 220 mg by mouth daily as needed (headache)., Disp: , Rfl:  ?  nitroGLYCERIN (NITROSTAT) 0.4 MG SL tablet, Place 1 tablet (0.4 mg total) under the tongue every 5 (five) minutes x 3 doses as needed for chest pain., Disp: 25 tablet, Rfl: 12 ?  vitamin B-12 (CYANOCOBALAMIN) 1000 MCG tablet, Take 1,000 mcg by mouth 3 (three) times a week., Disp: , Rfl:  ?  vitamin C (ASCORBIC ACID) 500 MG tablet, Take 500 mg by mouth daily., Disp: , Rfl:  ? ?Past Medical History: ?Past  Medical History:  ?Diagnosis Date  ? Coronary artery disease   ? Gout   ? Hyperlipidemia   ? Ischemic cardiomyopathy   ? MI (myocardial infarction) ([place]) 04/27/2021  ? ? ?Tobacco Use: ?Social History  ? ?Tobacco Use  ?Smoking Status Never  ?Smokeless Tobacco Never  ? ? ?Labs: ?Review Flowsheet   ? ?  ?  Latest Ref Rng & Units 05/25/2013 07/19/2014 11/17/2015 04/27/2021  ?Labs for [facility]P Cardiac and Pulmonary Rehab  ?Cholestrol 0 - 200 mg/dL 202   222   193     ?LDL (calc) 0 - 99 mg/dL  152   134     ?Direct LDL mg/dL 142.0       ?HDL-C >40 mg/dL 38.20   46.10   35.50     ?Trlycerides <150 mg/dL 153.0   122.0   118.0     ?Hemoglobin A1c 4.8 - 5.6 %    5.4    ? ?  04/28/2021  ?Labs for [facility]P Cardiac and Pulmonary Rehab  ?Cholestrol 213    ?LDL (calc) 134    ?Direct LDL   ?HDL-C 41    ?Trlycerides 191    ?Hemoglobin A1c   ?  ? ? Multiple values from one day are sorted in reverse-chronological order  ?  ?  ? ? ?Capillary Blood Glucose: ?Lab Results  ?Component Value Date  ?  GLUCAP 126 (H) 04/27/2021  ? ? ? ?Exercise Target Goals: ?Exercise Program Goal: ?Individual exercise prescription set using results from initial 6 min walk test and THRR while considering  patient?s activity barriers and safety.  ? ?Exercise Prescription Goal: ?Starting with aerobic activity 30 plus minutes a day, 3 days per week for initial exercise prescription. Provide home exercise prescription and guidelines that participant acknowledges understanding prior to discharge. ? ?Activity Barriers & Risk Stratification: ? Activity Barriers & Cardiac Risk Stratification - 07/03/21 1010   ? ?  ? Activity Barriers & Cardiac Risk Stratification  ? Activity Barriers Other (comment);History of Falls   ? Comments Patient has h/o 4 knee surgeries (2 each knee) and right shoulder surgery.   ? Cardiac Risk Stratification High   ? ?  ?  ? ?  ? ? ?6 Minute Walk: ? 6 Minute Walk   ? ? [place] Name 07/03/21 1039  ?  ?  ?  ? 6 Minute Walk  ? Phase Initial    ? Distance 1826  feet    ? Walk Time 6 minutes    ? # of Rest Breaks 0    ? MPH 3.46    ? METS 4.02    ? RPE 12    ? Perceived Dyspnea  0    ? VO2 Peak 14.06    ? Symptoms Yes (comment)    ? Comments Patient c/o feeling tired.    ? Resting HR 65 bpm    ? Resting BP 108/70    ? Resting Oxygen Saturation  98 %    ? Exercise Oxygen Saturation  during 6 min walk 98 %    ? Max Ex. HR 83 bpm    ? Max Ex. BP 120/62    ? 2 Minute Post BP 100/62    ? ?  ?  ? ?  ? ? ?Oxygen Initial Assessment: ? ? ?Oxygen Re-Evaluation: ? ? ?Oxygen Discharge (Final Oxygen Re-Evaluation): ? ? ?Initial Exercise Prescription: ? Initial Exercise Prescription - 07/03/21 1100   ? ?  ? Date of Initial Exercise RX and Referring Provider  ? Date 07/03/21   ? Referring Provider [name], MD   ? Expected Discharge Date 08/31/21   ?  ? Treadmill  ? MPH 2.4   ? Grade 0   ? Minutes 15   ? METs 2.84   ?  ? Bike  ? Level 2   ? Minutes 15   ? METs 3.5   ?  ? Prescription Details  ? Frequency (times per week) 3   ? Duration Progress to 30 minutes of continuous aerobic without signs/symptoms of physical distress   ?  ? Intensity  ? THRR 40-80% of Max Heartrate 64-129   ? Ratings of Perceived Exertion 11-13   ? Perceived Dyspnea 0-4   ?  ? Progression  ? Progression Continue to progress workloads to maintain intensity without signs/symptoms of physical distress.   ?  ? Resistance Training  ? Training Prescription Yes   ? Weight 4 lbs   ? Reps 10-15   ? ?  ?  ? ?  ? ? ?Perform Capillary Blood Glucose checks as needed. ? ?Exercise Prescription Changes: ? ? Exercise Prescription Changes   ? ? [place] Name 07/16/21 1626 08/06/21 1633  ?  ?  ?  ?  ? Response to Exercise  ? Blood Pressure (Admit) 118/58 110/70     ? Blood Pressure (Exercise) 158/72 140/66     ?  Blood Pressure (Exit) 104/60 106/64     ? Heart Rate (Admit) 69 bpm 64 bpm     ? Heart Rate (Exercise) 95 bpm 130 bpm     ? Heart Rate (Exit) 65 bpm 73 bpm     ? Rating of Perceived Exertion (Exercise) 11 10     ? Perceived  Dyspnea (Exercise) 0 0     ? Symptoms 0 0     ? Comments Pt first day in the CRP2 program Reviewed MET's goals and home ExRx     ? Duration Progress to 30 minutes of  aerobic without signs/symptoms of physical distress Progress to 30 minutes of  aerobic without signs/symptoms of physical distress     ? Intensity THRR unchanged THRR unchanged     ?  ? Progression  ? Progression Continue to progress workloads to maintain intensity without signs/symptoms of physical distress. Continue to progress workloads to maintain intensity without signs/symptoms of physical distress.     ? Average METs 3.17 6.3     ?  ? Resistance Training  ? Training Prescription Yes Yes     ? Weight 4 lbs 6 Lbs wts     ? Reps 10-15 10-15     ? Time 10 Minutes 10 Minutes     ?  ? Interval Training  ? Interval Training -- Yes     ? Equipment -- Treadmill     ? Comments -- Walk 3.4/2 Jog 4.3/2  (2 min jog, 1 min walk)     ?  ? Treadmill  ? MPH 2.4 3.4  Jog 4.3/2     ? Grade 0 0     ? Minutes 15 15     ? METs 2.84 4.54  Jog 8.18     ?  ? Bike  ? Level 2 4     ? Minutes 15 15     ? METs 3.5 6.2     ?  ? Home Exercise Plan  ? Plans to continue exercise at -- Home (comment)     ? Frequency -- Add 4 additional days to program exercise sessions.     ? Initial Home Exercises Provided -- 08/06/21     ? ?  ?  ? ?  ? ? ?Exercise Comments: ? ? Exercise Comments   ? ? [place] Name 07/16/21 1629 08/06/21 1645  ?  ?  ?  ? Exercise Comments Pt first day in the CRP2 program. Pt tolerated exercise well with an average MET level of 3.17. Pt is learning his THRR, RPE and ExRx. Off to a great start Reviewed MET's, goals and home ExRx with pt today. Pt tolerated exercise well with an average MET level of 6.31. Pt feels great about his goal of getting back into shape and being about to play softball again. Recently he was approved for jogging and has thoroughly enjoyed interval training in preperation for softball. Pt will continue to exercise on his own by using his  treadmill, stationary bike and walking 3-4 days a week for 30-45 mins per session     ? ?  ?  ? ?  ? ? ?Exercise Goals and Review: ? ? Exercise Goals   ? ? [place] Name 07/03/21 1010  ?  ?  ?  ?  ?  ? Exercise Goals

## 2021-08-17 ENCOUNTER — Encounter (HOSPITAL_COMMUNITY)
Admission: RE | Admit: 2021-08-17 | Discharge: 2021-08-17 | Disposition: A | Discharge status: Home / Self Care | Payer: No Typology Code available for payment source | Source: Ambulatory Visit | Attending: Cardiology | Admitting: Cardiology

## 2021-08-17 ENCOUNTER — Telehealth: Payer: Self-pay | Admitting: Physician Assistant

## 2021-08-17 DIAGNOSIS — I2111 ST elevation (STEMI) myocardial infarction involving right coronary artery: Secondary | ICD-10-CM

## 2021-08-17 DIAGNOSIS — Z955 Presence of coronary angioplasty implant and graft: Secondary | ICD-10-CM

## 2021-08-17 NOTE — Progress Notes (Signed)
Intermittent PAC's noted today. Leroy Campbell reports that he has been drinking a Celsius energy drink daily and has been going through a lot of stress at home care for his Mom and moving. Patient asymptomatic.VSS. Sharrell Ku [facility] paged and notified. Hinton Dyer advised [name] to avoid excessive caffeine intake. Will fax exercise flow sheets  and ECG tracings to Dr. Rosezella [place] office for review. Harrell Gave RN BSN  ? ?

## 2021-08-17 NOTE — Telephone Encounter (Signed)
? ?  Cross Cover Note ? ?Primary Cardiologist: [name], MD ? ?Cardmaster received page from [place] with outpatient cardiac rehab team. Chart reviewed, h/o MI in 04/2021 s/p DES to [facility] with EF 45-50% by cath and 45-50% by echo. Leroy Campbell observed that during today's cardiac rehab session he was having occasional PACs which were not typically observed during his previous sessions. The patient has been under increased family stress and also has been drinking an energy drink daily (Celcius). This contains '200mg'$  of caffeine per drink. He is asymptomatic and vital signs are stable. Otherwise already on beta blocker. I have recommended discontinuation of energy drink and observe for any symptoms. Leroy Campbell will fax strips to Dr. Rosezella [place] office. No acute interventions needed at this time but will route to primary cardiologist Dr. Percival Spanish to review. ? ?[name] PA-C ? ? ? ? ?

## 2021-08-20 ENCOUNTER — Encounter (HOSPITAL_COMMUNITY)
Admission: RE | Admit: 2021-08-20 | Discharge: 2021-08-20 | Disposition: A | Discharge status: Home / Self Care | Payer: No Typology Code available for payment source | Source: Ambulatory Visit | Attending: Cardiology | Admitting: Cardiology

## 2021-08-20 DIAGNOSIS — I2111 ST elevation (STEMI) myocardial infarction involving right coronary artery: Secondary | ICD-10-CM | POA: Diagnosis not present

## 2021-08-20 DIAGNOSIS — Z955 Presence of coronary angioplasty implant and graft: Secondary | ICD-10-CM

## 2021-08-21 ENCOUNTER — Telehealth: Payer: Self-pay | Admitting: *Deleted

## 2021-08-21 NOTE — Telephone Encounter (Signed)
[employer] denied CPAP titration. APAP order has been sent to [facility]. Patient notified. ?

## 2021-08-22 ENCOUNTER — Encounter (HOSPITAL_COMMUNITY)
Admission: RE | Admit: 2021-08-22 | Discharge: 2021-08-22 | Disposition: A | Discharge status: Home / Self Care | Payer: No Typology Code available for payment source | Source: Ambulatory Visit | Attending: Cardiology | Admitting: Cardiology

## 2021-08-22 DIAGNOSIS — I2111 ST elevation (STEMI) myocardial infarction involving right coronary artery: Secondary | ICD-10-CM | POA: Diagnosis not present

## 2021-08-22 DIAGNOSIS — Z955 Presence of coronary angioplasty implant and graft: Secondary | ICD-10-CM

## 2021-08-24 ENCOUNTER — Encounter (HOSPITAL_COMMUNITY)
Admission: RE | Admit: 2021-08-24 | Discharge: 2021-08-24 | Disposition: A | Discharge status: Home / Self Care | Payer: No Typology Code available for payment source | Source: Ambulatory Visit | Attending: Cardiology | Admitting: Cardiology

## 2021-08-24 DIAGNOSIS — Z955 Presence of coronary angioplasty implant and graft: Secondary | ICD-10-CM

## 2021-08-24 DIAGNOSIS — I2111 ST elevation (STEMI) myocardial infarction involving right coronary artery: Secondary | ICD-10-CM

## 2021-08-27 ENCOUNTER — Encounter (HOSPITAL_COMMUNITY): Payer: No Typology Code available for payment source

## 2021-08-27 ENCOUNTER — Telehealth (HOSPITAL_COMMUNITY): Payer: Self-pay | Admitting: *Deleted

## 2021-08-27 NOTE — Telephone Encounter (Signed)
Patient left message on department voicemail. He will be absent from cardiac rehab today, taking care of his mother. ?

## 2021-08-29 ENCOUNTER — Encounter (HOSPITAL_COMMUNITY): Payer: No Typology Code available for payment source

## 2021-08-29 ENCOUNTER — Telehealth (HOSPITAL_COMMUNITY): Payer: Self-pay | Admitting: Physician Assistant

## 2021-08-31 ENCOUNTER — Encounter (HOSPITAL_COMMUNITY): Payer: No Typology Code available for payment source

## 2021-08-31 ENCOUNTER — Telehealth (HOSPITAL_COMMUNITY): Payer: Self-pay | Admitting: Physician Assistant

## 2021-08-31 ENCOUNTER — Telehealth (HOSPITAL_COMMUNITY): Payer: Self-pay | Admitting: *Deleted

## 2021-09-03 NOTE — Progress Notes (Signed)
Discharge Progress Report ? ?Patient Details  ?Name: Leroy Campbell. ?MRN: [id number] ?Date of Birth: 20-Oct-1960 ?Referring Provider:   ?Flowsheet Row CARDIAC REHAB PHASE II ORIENTATION from 07/03/2021 in [facility]  ?Referring Provider [name], MD  ? ?  ? ? ? ?Number of Visits: 17 ? ?Reason for Discharge:  ?Patient reached a stable level of exercise. ?Patient independent in their exercise. ? ?Smoking History:  ?Social History  ? ?Tobacco Use  ?Smoking Status Never  ?Smokeless Tobacco Never  ? ? ?Diagnosis:  ?04/27/21 STEMI ? ?04/27/21 DES RCA ? ?ADL UCSD: ? ? ?Initial Exercise Prescription: ? Initial Exercise Prescription - 07/03/21 1100   ? ?  ? Date of Initial Exercise RX and Referring Provider  ? Date 07/03/21   ? Referring Provider [name], MD   ? Expected Discharge Date 08/31/21   ?  ? Treadmill  ? MPH 2.4   ? Grade 0   ? Minutes 15   ? METs 2.84   ?  ? Bike  ? Level 2   ? Minutes 15   ? METs 3.5   ?  ? Prescription Details  ? Frequency (times per week) 3   ? Duration Progress to 30 minutes of continuous aerobic without signs/symptoms of physical distress   ?  ? Intensity  ? THRR 40-80% of Max Heartrate 64-129   ? Ratings of Perceived Exertion 11-13   ? Perceived Dyspnea 0-4   ?  ? Progression  ? Progression Continue to progress workloads to maintain intensity without signs/symptoms of physical distress.   ?  ? Resistance Training  ? Training Prescription Yes   ? Weight 4 lbs   ? Reps 10-15   ? ?  ?  ? ?  ? ? ?Discharge Exercise Prescription (Final Exercise Prescription Changes): ? Exercise Prescription Changes - 08/17/21 1636   ? ?  ? Response to Exercise  ? Blood Pressure (Admit) 108/66   ? Blood Pressure (Exercise) 144/72   ? Blood Pressure (Exit) 114/70   ? Heart Rate (Admit) 60 bpm   ? Heart Rate (Exercise) 103 bpm   ? Heart Rate (Exit) 68 bpm   ? Rating of Perceived Exertion (Exercise) 10.5   ? Perceived Dyspnea (Exercise) 0   ? Symptoms 0   ? Comments  reviewed MET's   ? Duration Progress to 30 minutes of  aerobic without signs/symptoms of physical distress   ? Intensity THRR unchanged   ?  ? Progression  ? Progression Continue to progress workloads to maintain intensity without signs/symptoms of physical distress.   ? Average METs 5.37   ?  ? Resistance Training  ? Training Prescription Yes   ? Weight 6 Lbs wts   ? Reps 10-15   ? Time 10 Minutes   ?  ? Interval Training  ? Interval Training No   Pt sore from moving houses this week  ?  ? Treadmill  ? MPH 3.4   ? Grade 2   ? Minutes 15   ? METs 4.54   ?  ? Bike  ? Level 4   ? Minutes 15   ? METs 6.2   ?  ? Home Exercise Plan  ? Plans to continue exercise at Home (comment)   ? Frequency Add 4 additional days to program exercise sessions.   ? Initial Home Exercises Provided 08/06/21   ? ?  ?  ? ?  ? ? ?Functional Capacity: ? 6  Minute Walk   ? ? [place] Name 07/03/21 1039  ?  ?  ?  ? 6 Minute Walk  ? Phase Initial    ? Distance 1826 feet    ? Walk Time 6 minutes    ? # of Rest Breaks 0    ? MPH 3.46    ? METS 4.02    ? RPE 12    ? Perceived Dyspnea  0    ? VO2 Peak 14.06    ? Symptoms Yes (comment)    ? Comments Patient c/o feeling tired.    ? Resting HR 65 bpm    ? Resting BP 108/70    ? Resting Oxygen Saturation  98 %    ? Exercise Oxygen Saturation  during 6 min walk 98 %    ? Max Ex. HR 83 bpm    ? Max Ex. BP 120/62    ? 2 Minute Post BP 100/62    ? ?  ?  ? ?  ? ? ?Psychological, QOL, Others - Outcomes: ?PHQ 2/9: ? ?  07/03/2021  ? 12:31 PM 11/17/2015  ?  9:52 AM 02/18/2013  ? 11:07 AM  ?Depression screen PHQ 2/9  ?Decreased Interest 0 0 0  ?Down, Depressed, Hopeless 0 0 0  ?PHQ - 2 Score 0 0 0  ? ? ?Quality of Life: ? Quality of Life - 07/03/21 1124   ? ?  ? Quality of Life  ? [place] Quality of Life   ?  ? Quality of Life Scores  ? Health/Function Pre 21.27 %   ? Socioeconomic Pre 21 %   ? Psych/Spiritual Pre 22.71 %   ? Family Pre 23.7 %   ? GLOBAL Pre 21.87 %   ? ?  ?  ? ?  ? ? ?Personal Goals: ?Goals established at  orientation with interventions provided to work toward goal. ? Personal Goals and Risk Factors at Admission - 07/03/21 1003   ? ?  ? Core Components/Risk Factors/Patient Goals on Admission  ?  Weight Management Yes;Obesity;Weight Loss   ? Intervention Weight Management/Obesity: Establish reasonable short term and long term weight goals.;Obesity: Provide education and appropriate resources to help participant work on and attain dietary goals.   ? Admit Weight 213 lb 13.5 oz (97 kg)   ? Expected Outcomes Short Term: Continue to assess and modify interventions until short term weight is achieved;Weight Loss: Understanding of general recommendations for a balanced deficit meal plan, which promotes 1-2 lb weight loss per week and includes a negative energy balance of [phone] kcal/d   ? Lipids Yes   ? Intervention Provide education and support for participant on nutrition & aerobic/resistive exercise along with prescribed medications to achieve LDL <85m, HDL >467m   ? Expected Outcomes Short Term: Participant states understanding of desired cholesterol values and is compliant with medications prescribed. Participant is following exercise prescription and nutrition guidelines.;Long Term: Cholesterol controlled with medications as prescribed, with individualized exercise RX and with personalized nutrition plan. Value goals: LDL < 7055mHDL > 40 mg.   ? Stress Yes   ? Intervention Offer individual and/or small group education and counseling on adjustment to heart disease, stress management and health-related lifestyle change. Teach and support self-help strategies.;Refer participants experiencing significant psychosocial distress to appropriate mental health specialists for further evaluation and treatment. When possible, include family members and significant others in education/counseling sessions.   ? Expected Outcomes Short Term: Participant demonstrates changes in health-related behavior, relaxation and other  stress  management skills, ability to obtain effective social support, and compliance with psychotropic medications if prescribed.;Long Term: Emotional wellbeing is indicated by absence of clinically significant psychosocial distress or social isolation.   ? Personal Goal Other Yes   ? Personal Goal Get in better shape. Resume playing softball.   ? Intervention Provide individualized aerobic and resistance training plan to help improve cardiorespiratory fitness so that participant can be in better shape and return to leisure activities like softball.   ? Expected Outcomes Participant will have energy to resume playing softball and have increased functional capacity as measured by 6-minute walk test and self-report.   ? ?  ?  ? ?  ?  ? ?Personal Goals Discharge: ? Goals and Risk Factor Review   ? ? [place] Name 07/16/21 1710 08/15/21 1148  ?  ?  ?  ?  ? Core Components/Risk Factors/Patient Goals Review  ? Personal Goals Review Weight Management/Obesity;Lipids;Stress Weight Management/Obesity;Lipids;Stress     ? Review Danny started cardiac rehab on 07/16/21. Danny did well with exercise, vital signs were stable Kasandra Knudsen has been doing well with exercise at phase 2 cardiac rehab. Vital signs have been stable. Dany reports feeling stronger now. Kasandra Knudsen is now jogging during exercise and has more endurance.     ? Expected Outcomes Kasandra Knudsen will continue to participate in phase 2 cardiac rehab for exercise, nutrition and lifestyle modifications Kasandra Knudsen will continue to participate in phase 2 cardiac rehab for exercise, nutrition and lifestyle modifications     ? ?  ?  ? ?  ? ? ?Exercise Goals and Review: ? Exercise Goals   ? ? [place] Name 07/03/21 1010  ?  ?  ?  ?  ?  ? Exercise Goals  ? Increase Physical Activity Yes      ? Intervention Provide advice, education, support and counseling about physical activity/exercise needs.;Develop an individualized exercise prescription for aerobic and resistive training based on initial evaluation findings,  risk stratification, comorbidities and participant's personal goals.      ? Expected Outcomes Short Term: Attend rehab on a regular basis to increase amount of physical activity.;Long Term: Exercising r

## 2021-09-05 NOTE — Telephone Encounter (Signed)
Patient calling with question bout the machine he is suppose to be getting. Please advise  ?

## 2021-09-06 NOTE — Telephone Encounter (Signed)
I called and spoke with Ivin Booty @ [facility]. She states that she will contact the patient right now. ?

## 2021-10-12 ENCOUNTER — Other Ambulatory Visit: Payer: Self-pay

## 2021-10-12 ENCOUNTER — Encounter (HOSPITAL_BASED_OUTPATIENT_CLINIC_OR_DEPARTMENT_OTHER): Payer: Self-pay | Admitting: Emergency Medicine

## 2021-10-12 ENCOUNTER — Telehealth: Payer: Self-pay | Admitting: Cardiology

## 2021-10-12 ENCOUNTER — Emergency Department (HOSPITAL_BASED_OUTPATIENT_CLINIC_OR_DEPARTMENT_OTHER): Payer: No Typology Code available for payment source | Admitting: Radiology

## 2021-10-12 ENCOUNTER — Inpatient Hospital Stay (HOSPITAL_BASED_OUTPATIENT_CLINIC_OR_DEPARTMENT_OTHER)
Admission: EM | Admit: 2021-10-12 | Discharge: 2021-10-15 | DRG: 287 | Disposition: A | Discharge status: Home / Self Care | Payer: No Typology Code available for payment source | Attending: Internal Medicine | Admitting: Internal Medicine

## 2021-10-12 DIAGNOSIS — Z8249 Family history of ischemic heart disease and other diseases of the circulatory system: Secondary | ICD-10-CM

## 2021-10-12 DIAGNOSIS — I200 Unstable angina: Secondary | ICD-10-CM | POA: Diagnosis present

## 2021-10-12 DIAGNOSIS — R001 Bradycardia, unspecified: Secondary | ICD-10-CM | POA: Diagnosis present

## 2021-10-12 DIAGNOSIS — E785 Hyperlipidemia, unspecified: Secondary | ICD-10-CM | POA: Diagnosis present

## 2021-10-12 DIAGNOSIS — M109 Gout, unspecified: Secondary | ICD-10-CM | POA: Diagnosis present

## 2021-10-12 DIAGNOSIS — I25110 Atherosclerotic heart disease of native coronary artery with unstable angina pectoris: Secondary | ICD-10-CM | POA: Diagnosis present

## 2021-10-12 DIAGNOSIS — I252 Old myocardial infarction: Secondary | ICD-10-CM | POA: Diagnosis not present

## 2021-10-12 DIAGNOSIS — Z79899 Other long term (current) drug therapy: Secondary | ICD-10-CM | POA: Diagnosis not present

## 2021-10-12 DIAGNOSIS — Z635 Disruption of family by separation and divorce: Secondary | ICD-10-CM | POA: Diagnosis not present

## 2021-10-12 DIAGNOSIS — Z87892 Personal history of anaphylaxis: Secondary | ICD-10-CM

## 2021-10-12 DIAGNOSIS — I2583 Coronary atherosclerosis due to lipid rich plaque: Secondary | ICD-10-CM

## 2021-10-12 DIAGNOSIS — Z91030 Bee allergy status: Secondary | ICD-10-CM | POA: Diagnosis not present

## 2021-10-12 DIAGNOSIS — I255 Ischemic cardiomyopathy: Secondary | ICD-10-CM

## 2021-10-12 DIAGNOSIS — Z7982 Long term (current) use of aspirin: Secondary | ICD-10-CM | POA: Diagnosis not present

## 2021-10-12 DIAGNOSIS — Z888 Allergy status to other drugs, medicaments and biological substances status: Secondary | ICD-10-CM

## 2021-10-12 DIAGNOSIS — R079 Chest pain, unspecified: Secondary | ICD-10-CM | POA: Diagnosis not present

## 2021-10-12 DIAGNOSIS — E7800 Pure hypercholesterolemia, unspecified: Secondary | ICD-10-CM | POA: Diagnosis not present

## 2021-10-12 DIAGNOSIS — Z7902 Long term (current) use of antithrombotics/antiplatelets: Secondary | ICD-10-CM | POA: Diagnosis not present

## 2021-10-12 DIAGNOSIS — I2510 Atherosclerotic heart disease of native coronary artery without angina pectoris: Secondary | ICD-10-CM | POA: Diagnosis not present

## 2021-10-12 DIAGNOSIS — Z955 Presence of coronary angioplasty implant and graft: Secondary | ICD-10-CM | POA: Diagnosis not present

## 2021-10-12 LAB — BASIC METABOLIC PANEL
Anion gap: 6 (ref 5–15)
BUN: 13 mg/dL (ref 6–20)
CO2: 30 mmol/L (ref 22–32)
Calcium: 10.4 mg/dL — ABNORMAL HIGH (ref 8.9–10.3)
Chloride: 104 mmol/L (ref 98–111)
Creatinine, Ser: 0.93 mg/dL (ref 0.61–1.24)
GFR, Estimated: >60 mL/min (ref >60)
Glucose, Bld: 111 mg/dL — ABNORMAL HIGH (ref 70–99)
Potassium: 4.3 mmol/L (ref 3.5–5.1)
Sodium: 140 mmol/L (ref 135–145)

## 2021-10-12 LAB — COLOGUARD: COLOGUARD: NEGATIVE

## 2021-10-12 LAB — MRSA NEXT GEN BY PCR, NASAL: MRSA by PCR Next Gen: NOT DETECTED

## 2021-10-12 LAB — TROPONIN I (HIGH SENSITIVITY)
Troponin I (High Sensitivity): 5 ng/L (ref <18)
Troponin I (High Sensitivity): 5 ng/L (ref <18)

## 2021-10-12 LAB — CBC
HCT: 45.3 % (ref 39.0–52.0)
Hemoglobin: 15.1 g/dL (ref 13.0–17.0)
MCH: 29.5 pg (ref 26.0–34.0)
MCHC: 33.3 g/dL (ref 30.0–36.0)
MCV: 88.6 fL (ref 80.0–100.0)
Platelets: 173 10*3/uL (ref 150–400)
RBC: 5.11 MIL/uL (ref 4.22–5.81)
RDW: 13.1 % (ref 11.5–15.5)
WBC: 5.2 10*3/uL (ref 4.0–10.5)
nRBC: 0 % (ref 0.0–0.2)

## 2021-10-12 LAB — HEPARIN LEVEL (UNFRACTIONATED): Heparin Unfractionated: 0.5 IU/mL (ref 0.30–0.70)

## 2021-10-12 MED ORDER — ASPIRIN 300 MG RE SUPP
300.0000 mg | RECTAL | Status: AC
  Filled 2021-10-12: qty 1

## 2021-10-12 MED ORDER — ASPIRIN 81 MG PO CHEW
324.0000 mg | CHEWABLE_TABLET | ORAL | Status: AC
  Administered 2021-10-12: 324 mg via ORAL
  Filled 2021-10-12: qty 4

## 2021-10-12 MED ORDER — ASPIRIN 81 MG PO TBEC: 81.0000 mg | DELAYED_RELEASE_TABLET | Freq: Every day | ORAL | Status: DC

## 2021-10-12 MED ORDER — ONDANSETRON HCL 4 MG/2ML IJ SOLN: 4.0000 mg | Freq: Four times a day (QID) | INTRAMUSCULAR | Status: DC | PRN

## 2021-10-12 MED ORDER — CLOPIDOGREL BISULFATE 75 MG PO TABS
75.0000 mg | ORAL_TABLET | Freq: Every day | ORAL | Status: DC
Start: 2021-10-13 — End: 2021-10-15
  Administered 2021-10-13 – 2021-10-15 (×3): 75 mg via ORAL
  Filled 2021-10-12 (×3): qty 1

## 2021-10-12 MED ORDER — ALLOPURINOL 100 MG PO TABS
200.0000 mg | ORAL_TABLET | Freq: Two times a day (BID) | ORAL | Status: DC
  Administered 2021-10-12 – 2021-10-15 (×6): 200 mg via ORAL
  Filled 2021-10-12 (×6): qty 2

## 2021-10-12 MED ORDER — NITROGLYCERIN 0.4 MG SL SUBL: 0.4000 mg | SUBLINGUAL_TABLET | SUBLINGUAL | Status: DC | PRN

## 2021-10-12 MED ORDER — ATORVASTATIN CALCIUM 80 MG PO TABS
80.0000 mg | ORAL_TABLET | Freq: Every day | ORAL | Status: DC
  Administered 2021-10-13 – 2021-10-15 (×3): 80 mg via ORAL
  Filled 2021-10-12 (×3): qty 1

## 2021-10-12 MED ORDER — HEPARIN (PORCINE) 25000 UT/250ML-% IV SOLN
1300.0000 [IU]/h | INTRAVENOUS | Status: DC
  Administered 2021-10-12 – 2021-10-13 (×2): 1200 [IU]/h via INTRAVENOUS
  Administered 2021-10-14 (×2): 1300 [IU]/h via INTRAVENOUS
  Filled 2021-10-12 (×4): qty 250

## 2021-10-12 MED ORDER — ACETAMINOPHEN 325 MG PO TABS
650.0000 mg | ORAL_TABLET | ORAL | Status: DC | PRN
  Administered 2021-10-14: 650 mg via ORAL
  Filled 2021-10-12: qty 2

## 2021-10-12 MED ORDER — LOSARTAN POTASSIUM 25 MG PO TABS
25.0000 mg | ORAL_TABLET | Freq: Every day | ORAL | Status: DC
  Administered 2021-10-13 – 2021-10-15 (×3): 25 mg via ORAL
  Filled 2021-10-12 (×3): qty 1

## 2021-10-12 MED ORDER — ASPIRIN 81 MG PO CHEW
81.0000 mg | CHEWABLE_TABLET | Freq: Every day | ORAL | Status: DC
  Administered 2021-10-13 – 2021-10-15 (×3): 81 mg via ORAL
  Filled 2021-10-12 (×3): qty 1

## 2021-10-12 MED ORDER — NITROGLYCERIN IN D5W 200-5 MCG/ML-% IV SOLN
0.0000 ug/min | INTRAVENOUS | Status: DC
  Administered 2021-10-12: 15 ug/min via INTRAVENOUS

## 2021-10-12 MED ORDER — HEPARIN BOLUS VIA INFUSION
4000.0000 [IU] | Freq: Once | INTRAVENOUS | Status: AC
  Administered 2021-10-12: 4000 [IU] via INTRAVENOUS

## 2021-10-12 NOTE — ED Provider Notes (Signed)
[facility] EMERGENCY DEPT Provider Note   CSN: [id number] Arrival date & time: 10/12/21  1056     History  Chief Complaint  Patient presents with   Chest Pain    Leroy Campbell. is a 61 y.o. male presenting emerged department with chest pain ongoing for a week.  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  Leroy Campbell reports Leroy Campbell has had intermittent chest pressure episodes since then, but never persistent.  For the past 7 days Leroy Campbell has had persistent chest pressure which radiates across Leroy Campbell chest.  It is not associated with the typical shoulder tightness and nausea that Leroy Campbell had with Leroy Campbell MI.  However Leroy Campbell girlfriend notes that Leroy Campbell "color looks bad".  Leroy Campbell cardiologist Dr Percival Spanish today by phone recommend that Leroy Campbell take nitroglycerin and come to the ER.  The patient reports for 2 nitroglycerin tablets Leroy Campbell pain significantly improved but is still minimally present.  Leroy Campbell has been compliant with all of Leroy Campbell other medications.  Leroy Campbell is taking aspirin and Plavix, as Leroy Campbell had an adverse reaction to Brilinta.  Cath report 04/27/21:   Prox LAD lesion is 35% stenosed.   1st Diag lesion is 50% stenosed.   Mid Cx to Dist Cx lesion is 20% stenosed.   Mid RCA lesion is 100% stenosed.   A drug-eluting stent was successfully placed using a STENT ONYX FRONTIER 3.5X22.   Post intervention, there is a 0% residual stenosis.   There is mild left ventricular systolic dysfunction.   LV end diastolic pressure is normal.   The left ventricular ejection fraction is 45-50% by visual estimate.   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 Mild LV dysfunction Normal LVEDP Successful PCI of the mid RCA with DES x 1   Plan: DAPT for one year. May be on fast track for DC depending on clinical course.   HPI     Home Medications Prior to Admission medications   Medication Sig Start Date End Date Taking? Authorizing Provider  allopurinol (ZYLOPRIM) 100 MG tablet Take  200 mg by mouth 2 (two) times daily. 04/09/21   [provider]  aspirin 81 MG chewable tablet Chew 1 tablet (81 mg total) by mouth daily. 04/30/21   Barrett, Evelene Croon, PA-C  atorvastatin (LIPITOR) 80 MG tablet Take 1 tablet (80 mg total) by mouth daily. 05/25/21   [name], MD  clopidogrel (PLAVIX) 75 MG tablet Take 1 tablet (75 mg total) by mouth daily. Take 2 tablets first day, then 1 daily Patient taking differently: Take 75 mg by mouth daily. 05/25/21   [name], MD  colchicine 0.6 MG tablet Take 0.6 mg by mouth daily as needed (gout). 05/02/21   [provider]  losartan (COZAAR) 25 MG tablet Take 1 tablet (25 mg total) by mouth daily. 05/28/21 08/26/21  [name], MD  metoprolol succinate (TOPROL-XL) 25 MG 24 hr tablet Take 1 tablet (25 mg total) by mouth daily. 05/25/21   [name], MD  naproxen sodium (ALEVE) 220 MG tablet Take 220 mg by mouth daily as needed (headache).    [provider]  nitroGLYCERIN (NITROSTAT) 0.4 MG SL tablet Place 1 tablet (0.4 mg total) under the tongue every 5 (five) minutes x 3 doses as needed for chest pain. 04/29/21   Barrett, Evelene Croon, PA-C  vitamin B-12 (CYANOCOBALAMIN) 1000 MCG tablet Take 1,000 mcg by mouth 3 (three) times a week.    [provider]  vitamin C (ASCORBIC ACID) 500 MG tablet Take 500 mg by mouth daily.    [provider]      Allergies    Bee venom and Brilinta [ticagrelor]    Review of Systems   Review of Systems  Physical Exam Updated Vital Signs BP 132/69   Pulse (!) 50   Temp 98.9 F (37.2 C) (Oral)   Resp 20   Ht '5\' 11"'$  (1.803 m)   Wt 94.3 kg   SpO2 97%   BMI 29.01 kg/m  Physical Exam Constitutional:      General: Leroy Campbell is not in acute distress. HENT:     Head: Normocephalic and atraumatic.  Eyes:     Conjunctiva/sclera: Conjunctivae normal.     Pupils: Pupils are equal, round, and reactive to light.  Cardiovascular:     Rate and Rhythm: Normal rate and  regular rhythm.  Pulmonary:     Effort: Pulmonary effort is normal. No respiratory distress.  Abdominal:     General: There is no distension.     Tenderness: There is no abdominal tenderness.  Skin:    General: Skin is warm and dry.  Neurological:     General: No focal deficit present.     Mental Status: Leroy Campbell is alert. Mental status is at baseline.  Psychiatric:        Mood and Affect: Mood normal.        Behavior: Behavior normal.     ED Results / Procedures / Treatments   Labs (all labs ordered are listed, but only abnormal results are displayed) Labs Reviewed  BASIC METABOLIC PANEL - Abnormal; Notable for the following components:      Result Value   Glucose, Bld 111 (*)    Calcium 10.4 (*)    All other components within normal limits  CBC  HEPARIN LEVEL (UNFRACTIONATED)  TROPONIN I (HIGH SENSITIVITY)  TROPONIN I (HIGH SENSITIVITY)    EKG EKG Interpretation  Date/Time:  Friday October 12 2021 11:08:43 EDT Ventricular Rate:  51 PR Interval:  162 QRS Duration: 90 QT Interval:  430 QTC Calculation: 396 R Axis:   32 Text Interpretation: Sinus bradycardia Mild inferior q waves noted on May 06 2021 ecg Abnormal ECG When compared with ECG of 06-May-2021 11:52, no acute ischemic findings Confirmed by Octaviano Glow [phone]) on 10/12/2021 12:15:32 PM  Radiology DG Chest 2 View  Result Date: 10/12/2021 CLINICAL DATA:  Chest pain EXAM: CHEST - 2 VIEW COMPARISON:  05/06/2021 FINDINGS: Cardiac and mediastinal contours are within normal limits. No focal pulmonary opacity. No pleural effusion or pneumothorax. No acute osseous abnormality. IMPRESSION: No acute cardiopulmonary process. Electronically Signed   By: Merilyn Baba M.D.   On: 10/12/2021 11:39    Procedures .Critical Care  Performed by: Wyvonnia Dusky, MD Authorized by: Wyvonnia Dusky, MD   Critical care provider statement:    Critical care time (minutes):  45   Critical care time was exclusive of:  Separately  billable procedures and treating other patients   Critical care was necessary to treat or prevent imminent or life-threatening deterioration of the following conditions:  Circulatory failure   Critical care was time spent personally by me on the following activities:  Ordering and performing treatments and interventions, ordering and review of laboratory studies, ordering and review of radiographic studies, pulse oximetry, review of old charts, examination of patient and evaluation of patient's response to treatment Comments:     IV heparin for unstable angina  Medications Ordered in ED Medications  heparin ADULT infusion 100 units/mL (25000 units/271m) (1,200 Units/hr Intravenous New Bag/Given 10/12/21 1352)  heparin bolus via infusion 4,000 Units (4,000 Units Intravenous Bolus from Bag 10/12/21 1354)    ED Course/ Medical Decision Making/ A&P Clinical Course as of 10/12/21 1538  Fri Oct 12, 2021  1322 Dr TRadford Paxcardiology recommending IV heparin and cardiology admission for unstable angina.  Pt agreeable for admission [MT]    Clinical Course User Index [MT] Yassin Scales, MCarola Rhine MD                           Medical Decision Making Amount and/or Complexity of Data Reviewed Labs: ordered. Radiology: ordered.  Risk Prescription drug management. Decision regarding hospitalization.   This patient presents to the ED with concern for chest pain. This involves an extensive number of treatment options, and is a complaint that carries with it a high risk of complications and morbidity.  The differential diagnosis includes ACS versus unstable angina versus pneumonia versus reflux versus coronary vasospasm versus other  Patient is most consistent with unstable angina, given change in pattern of Leroy Campbell intermittent chest pain since Leroy Campbell stent placement.  Leroy Campbell could be at risk for stent reocclusion.  Leroy Campbell did not have any other significant coronary disease on Leroy Campbell catheterization 6 months ago, with a  maximum 50% stenosis of the first diagonal branch.  Co-morbidities that complicate the patient evaluation: History of significant coronary disease with stent placed several months ago, at risk for reocclusion and further coronary  Additional history obtained from patient's girlfriend at bedside  External records from outside source obtained and reviewed including left heart cath report from December of last year, stent placement  I ordered and personally interpreted labs.  The pertinent results include: Troponin is unremarkable  I ordered imaging studies including x-ray of the chest I independently visualized and interpreted imaging which showed no focal infiltrate or pneumothorax I agree with the radiologist interpretation  The patient was maintained on a cardiac monitor.  I personally viewed and interpreted the cardiac monitored which showed an underlying rhythm of: Normal sinus  Per my interpretation the patient's ECG shows normal sinus rhythm, chronic inferior Q waves  I ordered medication including nitro as needed ordered for recurring chest pain, as there was improvement of Leroy Campbell pain with nitro at home.  Currently has mild symptoms, not requesting any medications.  I have reviewed the patients home medicines and have made adjustments as needed  Test Considered: Lower suspicion for acute pulmonary embolism with Leroy Campbell presentation  I requested consultation with the cardiology,  and discussed lab and imaging findings as well as pertinent plan - they recommend: IV heparin and medical admission to the cardiology service  After the interventions noted above, I reevaluated the patient and found that they have: stayed the same  Dispostion:  After consideration of the diagnostic results and the patients response to treatment, I feel that the patent would benefit from admission to cardiology.         Final Clinical Impression(s) / ED Diagnoses Final diagnoses:  Unstable angina  ([facility]    Rx / DC Orders ED Discharge Orders     None         TWyvonnia Dusky MD 10/12/21 1541

## 2021-10-12 NOTE — Plan of Care (Signed)

## 2021-10-12 NOTE — Telephone Encounter (Signed)
Pt c/o of Chest Pain: STAT if CP now or developed within 24 hours  1. Are you having CP right now? Chest tightness, some pressure, feels like heartburn  2. Are you experiencing any other symptoms (ex. SOB, nausea, vomiting, sweating)? no  3. How long have you been experiencing CP? about a week  4. Is your CP continuous or coming and going? Pretty consistent   5. Have you taken Nitroglycerin? no ?

## 2021-10-12 NOTE — Telephone Encounter (Addendum)
Received a call to triage. Patient reports heartburn for 1 week, midchest pressure/ache and pain 3/10. Denies back/jaw/arm pain and denies sob. Tums did not help with heartburn. He has not taken NTG over the past week. 10:15 AM current BP 132/63, P 57. Placed ntg under tongue; had less tightness and pain 2/10. At 10:20 another ntg, resulting in all tightness gone and pain 0/10. Recommended that he be taken to the ED. To be evaluated. Patient teaching on when to use NTG. He stood from chair and was not dizzy. His girlfriend was taking him to either [facility] or [facility] ED.

## 2021-10-12 NOTE — Progress Notes (Signed)
ANTICOAGULATION CONSULT NOTE - Initial Consult  Pharmacy Consult for heparin Indication: chest pain/ACS  Allergies  Allergen Reactions   Bee Venom Anaphylaxis and Swelling   Brilinta [Ticagrelor] Anaphylaxis and Hives    Patient Measurements: Height: '5\' 11"'$  (180.3 cm) Weight: 95.4 kg (210 lb 5.1 oz) IBW/kg (Calculated) : 75.3 Heparin Dosing Weight: 94.3kg  Vital Signs: Temp: 98.2 F (36.8 C) (06/09 1943) Temp Source: Oral (06/09 1943) BP: 102/59 (06/09 1943) Pulse Rate: 56 (06/09 1943)  Labs: Recent Labs    10/12/21 1109 10/12/21 1314 10/12/21 1925  HGB 15.1  --   --   HCT 45.3  --   --   PLT 173  --   --   HEPARINUNFRC  --   --  0.50  CREATININE 0.93  --   --   TROPONINIHS 5 5  --      Estimated Creatinine Clearance: 100.8 mL/min (by C-G formula based on SCr of 0.93 mg/dL).   Medical History: Past Medical History:  Diagnosis Date   Coronary artery disease    Gout    Hyperlipidemia    Ischemic cardiomyopathy    MI (myocardial infarction) ([place]) 04/27/2021    Medications:  Infusions:   heparin 1,200 Units/hr (10/12/21 1715)   nitroGLYCERIN 15 mcg/min (10/12/21 1851)    Assessment: 82 yom presented to the ED with CP. To start IV heparin. Baseline CBC is WNL and he is not on anticoagulation PTA.   PM update: HL 0.5 and therapeutic. Draw confirmatory heparin level in AM  Goal of Therapy:  Heparin level 0.3-0.7 units/ml Monitor platelets by anticoagulation protocol: Yes   Plan:  Continue Heparin gtt 1200 units/hr Check a 6 hr confirmatory heparin level Daily heparin level and CBC  Kendre Sires A. Levada Dy, PharmD, BCPS, FNKF Clinical Pharmacist [place] Please utilize Amion for appropriate phone number to reach the unit pharmacist ([place])  10/12/2021,8:05 PM

## 2021-10-12 NOTE — H&P (Addendum)
Cardiology Admission History and Physical:   Patient ID: Leroy Campbell. MRN: [id number]; DOB: 03-04-1961   Admission date: 10/12/2021  Primary Care Provider: [name], PA [facility] Cardiologist: Leroy Breeding, Leroy Campbell  [facility] Electrophysiologist:  None   Chief Complaint:  Chest pain  Patient Profile:   Leroy Tooker. is a 61 y.o. 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  EF at that time was 45%.  He now presents to [facility] with complaints of intermittent chest pain for a week proved with nitrates.  We are now asked to evaluate patient for recurrent chest pain furred by Leroy Glow, Leroy Campbell.  History of Present Illness Leroy Campbell. is a 61 y.o. 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  EF at that time was 45%. He apparently has had intermittent chest discomfort over the past week but never persistent and not as severe as like what he had with his MI.  When he had his MI he his pain was more diffuse across his chest and radiated into his shoulder with shoulder tightness into his back  and nausea.  His chest pain this week has not radiated but he has felt fatigued which is new for him and some nausea.  His girlfriend thought that his color was looking bad and so he called his cardiologist Leroy Campbell today who recommended that he take nitroglycerin and go to the emergency room.  [facility] after taking 2 sublingual nitroglycerin with significant improvement in his pain but a slight residual pain was still present.  He has not missed any doses of aspirin or Plavix.  Labs in the ER showed a sodium of 140, potassium 4.3, creatinine 0.93, LFTs normal, troponin 5 and chest x-ray normal.  KG showed sinus bradycardia 51 bpm with inferior posterior infarct old with no acute ST changes.   Given that he is in the 66-monthfor restenosis with recurrent chest pain nitrate responsive cardiology is now asked to admit.  [place] [facility] [place] had some more pain so he was started on IV nitro drip and is now pain-free   Past Medical History:  Diagnosis Date   Coronary artery disease    Gout    Hyperlipidemia    Ischemic cardiomyopathy    [place]    Past Surgical History:  Procedure Laterality Date   CARDIAC CATHETERIZATION     CORONARY STENT INTERVENTION N/A 04/27/2021   Procedure: CORONARY STENT INTERVENTION;  Surgeon: Leroy Peter Campbell, Leroy Campbell;  [facility];  Service: Cardiovascular;  Laterality: N/A;   KNEE ARTHROSCOPY Bilateral    x2 bilaterally   KNEE SURGERY Bilateral    x's 4-- 2 surgeries on both knees   LEFT AND RIGHT HEART CATHETERIZATION WITH CORONARY ANGIOGRAM N/A 10/19/2013   Procedure: LEFT AND RIGHT HEART CATHETERIZATION WITH CORONARY ANGIOGRAM;  Surgeon: Leroy Campbell;  [facility];  Service: Cardiovascular;  Laterality: N/A;   LEFT HEART CATH AND CORONARY ANGIOGRAPHY N/A 04/27/2021   Procedure: LEFT HEART CATH AND CORONARY ANGIOGRAPHY;  Surgeon: Leroy Peter Campbell, Leroy Campbell;  [facility];  Service: Cardiovascular;  Laterality: N/A;   SHOULDER SURGERY Right 05/06/2009   TONSILLECTOMY AND ADENOIDECTOMY  Medications Prior to Admission: Prior to Admission medications   Medication Sig Start Date End Date Taking? Authorizing Provider  allopurinol (ZYLOPRIM) 100 MG tablet Take 200 mg by mouth 2 (two) times daily. 04/09/21   Provider, Historical, Leroy Campbell  aspirin 81 MG chewable tablet Chew 1 tablet (81 mg total) by mouth daily. 04/30/21   Barrett, Evelene Croon, PA-C  atorvastatin (LIPITOR) 80 MG tablet Take 1 tablet (80 mg total) by mouth daily. 05/25/21   Leroy Breeding, Leroy Campbell  clopidogrel (PLAVIX) 75 MG tablet Take 1 tablet (75 mg total) by mouth daily. Take 2  tablets first day, then 1 daily Patient taking differently: Take 75 mg by mouth daily. 05/25/21   Leroy Breeding, Leroy Campbell  colchicine 0.6 MG tablet Take 0.6 mg by mouth daily as needed (gout). 05/02/21   Provider, Historical, Leroy Campbell  losartan (COZAAR) 25 MG tablet Take 1 tablet (25 mg total) by mouth daily. 05/28/21 08/26/21  Leroy Breeding, Leroy Campbell  metoprolol succinate (TOPROL-XL) 25 MG 24 hr tablet Take 1 tablet (25 mg total) by mouth daily. 05/25/21   Leroy Breeding, Leroy Campbell  naproxen sodium (ALEVE) 220 MG tablet Take 220 mg by mouth daily as needed (headache).    Provider, Historical, Leroy Campbell  nitroGLYCERIN (NITROSTAT) 0.4 MG SL tablet Place 1 tablet (0.4 mg total) under the tongue every 5 (five) minutes x 3 doses as needed for chest pain. 04/29/21   Barrett, Evelene Croon, PA-C  vitamin B-12 (CYANOCOBALAMIN) 1000 MCG tablet Take 1,000 mcg by mouth 3 (three) times a week.    Provider, Historical, Leroy Campbell  vitamin C (ASCORBIC ACID) 500 MG tablet Take 500 mg by mouth daily.    Provider, Historical, Leroy Campbell     Allergies:    Allergies  Allergen Reactions   Bee Venom Anaphylaxis and Swelling   Brilinta [Ticagrelor] Anaphylaxis and Hives    Social History:   Social History   Socioeconomic History   Marital status: Legally Separated    Spouse name: Not on file   Number of children: 2   Years of education: 16   Highest education level: Not on file  Occupational History   Not on file  Tobacco Use   Smoking status: Never   Smokeless tobacco: Never  Substance and Sexual Activity   Alcohol use: Yes    Comment: Rarely   Drug use: No   Sexual activity: Yes    Partners: Female  Other Topics Concern   Not on file  Social History Narrative   Exercise-- bike, coaching basketball,  softball   Social Determinants of Health   Financial Resource Strain: Not on file  Food Insecurity: Not on file  Transportation Needs: Not on file  Physical Activity: Not on file  Stress: Not on file  Social Connections: Not on file   Intimate Partner Violence: Not on file    Family History:   The patient's family history includes Diabetes in his paternal grandmother; Heart disease in his maternal grandfather and mother.    ROS:  Please see the history of present illness.  All other ROS reviewed and negative.     Physical Exam/Data:   Vitals:   10/12/21 1215 10/12/21 1300 10/12/21 1625 10/12/21 1628  BP: 125/72 132/69 129/74   Pulse: (!) 46 (!) 50 (!) 54   Resp: '14 20 16   '$ Temp:    97.7 F (36.5 C)  TempSrc:    Oral  SpO2: 95% 97% 98%   Weight:      Height:  No intake or output data in the 24 hours ending 10/12/21 1716    10/12/2021   11:05 AM 07/03/2021    9:57 AM 05/25/2021   10:30 AM  Last 3 Weights  Weight (lbs) 208 lb 213 lb 13.5 oz 219 lb 12.8 oz  Weight (kg) 94.348 kg 97 kg 99.701 kg     Body mass index is 29.01 kg/Campbell.  General:  Well nourished, well developed, in no acute distress HEENT: normal Lymph: no adenopathy Neck: no JVD Endocrine:  No thryomegaly Vascular: No carotid bruits; FA pulses 2+ bilaterally without bruits  Cardiac:  normal S1, S2; RRR; no murmur  Lungs:  clear to auscultation bilaterally, no wheezing, rhonchi or rales  Abd: soft, nontender, no hepatomegaly  Ext: no edema Musculoskeletal:  No deformities, BUE and BLE strength normal and equal Skin: warm and dry  Neuro:  CNs 2-12 intact, no focal abnormalities noted Psych:  Normal affect    EKG:  The ECG that was done 6/9 was personally reviewed and demonstrates sinus bradycardia 51 bpm with old inferior posterior infarct and no acute ST changes  Relevant CV Studies: Cardiac cath 04/27/2021 Conclusion     Prox LAD lesion is 35% stenosed.   1st Diag lesion is 50% stenosed.   Mid Cx to Dist Cx lesion is 20% stenosed.   Mid RCA lesion is 100% stenosed.   A drug-eluting stent was successfully placed using a STENT ONYX FRONTIER [id number].   Post intervention, there is a 0% residual stenosis.   There is mild left  ventricular systolic dysfunction.   LV end diastolic pressure is normal.   The left ventricular ejection fraction is 45-50% by visual estimate.   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 Mild LV dysfunction Normal LVEDP Successful PCI of the mid RCA with DES x 1   Plan: DAPT for one year. May be on fast track for DC depending on clinical course.    2D echo 04/28/2021 IMPRESSIONS    1. Left ventricular ejection fraction, by estimation, is 45 to 50%. The  left ventricle has mildly decreased function. The left ventricle  demonstrates regional wall motion abnormalities. Inferior hypokinesis.  There is mild left ventricular hypertrophy.  Left ventricular diastolic parameters are consistent with Grade I  diastolic dysfunction (impaired relaxation).   2. Right ventricular systolic function is mildly reduced. The right  ventricular size is normal. Tricuspid regurgitation signal is inadequate  for assessing PA pressure.   3. The mitral valve is normal in structure. No evidence of mitral valve  regurgitation. No evidence of mitral stenosis.   4. The aortic valve was not well visualized. Aortic valve regurgitation  is not visualized. No aortic stenosis is present.   5. The inferior vena cava is normal in size with greater than 50%  respiratory variability, suggesting right atrial pressure of 3 mmHg.   Laboratory Data:  High Sensitivity Troponin:   Recent Labs  Lab 10/12/21 1109 10/12/21 1314  TROPONINIHS 5 5      Chemistry Recent Labs  Lab 10/12/21 1109  NA 140  K 4.3  CL 104  CO2 30  GLUCOSE 111*  BUN 13  CREATININE 0.93  CALCIUM 10.4*  GFRNONAA >60  ANIONGAP 6    No results for input(s): "PROT", "ALBUMIN", "AST", "ALT", "ALKPHOS", "BILITOT" in the last 168 hours. Hematology Recent Labs  Lab 10/12/21 1109  WBC 5.2  RBC 5.11  HGB 15.1  HCT 45.3  MCV 88.6  MCH 29.5  MCHC 33.3  RDW 13.1  PLT 173   BNPNo results for input(s): "BNP",  "PROBNP" in the last 168 hours.  DDimer No results for input(s): "DDIMER" in the last 168 hours.   Radiology/Studies:  DG Chest 2 View  Result Date: 10/12/2021 CLINICAL DATA:  Chest pain EXAM: CHEST - 2 VIEW COMPARISON:  05/06/2021 FINDINGS: Cardiac and mediastinal contours are within normal limits. No focal pulmonary opacity. No pleural effusion or pneumothorax. No acute osseous abnormality. IMPRESSION: No acute cardiopulmonary process. Electronically Signed   By: Merilyn Baba Campbell.D.   On: 10/12/2021 11:39     Assessment and Plan:   Chest pain/ASCAD -Patient status post inferior MI 04/24/2021 with PCI of the RCA with nonobstructive disease of 35% in the proximal LAD, 50% D1 and 20% mid to distal left circumflex status post PCI of the RCA -Brilinta was subsequently changed to Plavix due to a rash which resolved -Now presenting with several week history of intermittent chest pain resolved with sublingual nitro x2 today -Troponin normal and EKG shows old inferior posterior MI with no acute ST changes -He is in the 11-monthtimeframe for restenosis so need to consider that he may have developed restenosis of the RCA stent -He has been compliant with all his cardiac meds including aspirin and Plavix -Currently pain-free -Continue home medications including aspirin 81 mg daily, atorvastatin 80 mg daily -We will hold Toprol due to resting bradycardia at 50 bpm currently on telemetry -Continue IV heparin started in the ER -Now on IV nitro drip as he had chest pain [facility] to being transported by [facility] which we will continue here -Cycle troponin -Repeat 2D echo -Recommend left heart cath on Monday or sooner if he becomes unstable to redefine coronary anatomy and stent  2.  Hyperlipidemia -LDL goal less than 70 -Continue atorvastatin 80 mg daily -Check FLP and ALT in a.Campbell.  3.  Ischemic cardiomyopathy -EF 45 to 50% with inferior hypokinesis on echo 04/2021 -Continue losartan 25  mg daily  -Hold beta-blocker due to bradycardia -2D echo to reassess LV function. -If EF remains reduced consider transitioning ARB to Entresto and addition of SGLT2i -He does not appear volume overloaded on exam Chest pain TIMI Risk Score for Unstable Angina or Non-ST Elevation MI:   The patient's TIMI risk score is 4, which indicates a 20% risk of all cause mortality, new or recurrent myocardial infarction or need for urgent revascularization in the next 14 days.{   Severity of Illness: The appropriate patient status for this patient is INPATIENT. Inpatient status is judged to be reasonable and necessary in order to provide the required intensity of service to ensure the patient's safety. The patient's presenting symptoms, physical exam findings, and initial radiographic and laboratory data in the context of their chronic comorbidities is felt to place them at high risk for further clinical deterioration. Furthermore, it is not anticipated that the patient will be medically stable for discharge from the hospital within 2 midnights of admission.   * I certify that at the point of admission it is my clinical judgment that the patient will require inpatient hospital care spanning beyond 2 midnights from the point of admission due to high intensity of service, high risk for further deterioration and high frequency of surveillance required.*   For questions or updates, please contact [place] consult [email or web address] for contact info under     Signed, TFransico Him Leroy Campbell  10/12/2021 5:16 PM

## 2021-10-12 NOTE — Progress Notes (Signed)
ANTICOAGULATION CONSULT NOTE - Initial Consult  Pharmacy Consult for heparin Indication: chest pain/ACS  Allergies  Allergen Reactions   Bee Venom Anaphylaxis and Swelling   Brilinta [Ticagrelor] Anaphylaxis and Hives    Patient Measurements: Height: '5\' 11"'$  (180.3 cm) Weight: 94.3 kg (208 lb) IBW/kg (Calculated) : 75.3 Heparin Dosing Weight: 94.3kg  Vital Signs: Temp: 98.9 F (37.2 C) (06/09 1104) Temp Source: Oral (06/09 1104) BP: 132/69 (06/09 1300) Pulse Rate: 50 (06/09 1300)  Labs: Recent Labs    10/12/21 1109  HGB 15.1  HCT 45.3  PLT 173  CREATININE 0.93  TROPONINIHS 5    Estimated Creatinine Clearance: 100.3 mL/min (by C-G formula based on SCr of 0.93 mg/dL).   Medical History: Past Medical History:  Diagnosis Date   Coronary artery disease    Gout    Hyperlipidemia    Ischemic cardiomyopathy    MI (myocardial infarction) ([place]) 04/27/2021    Medications:  Infusions:   heparin      Assessment: 86 yom presented to the ED with CP. To start IV heparin. Baseline CBC is WNL and he is not on anticoagulation PTA.   Goal of Therapy:  Heparin level 0.3-0.7 units/ml Monitor platelets by anticoagulation protocol: Yes   Plan:  Heparin bolus 4000 units IV x 1 Heparin gtt 1200 units/hr Check a 6 hr heparin level Daily heparin level and CBC  Amoree Newlon, Rande Lawman 10/12/2021,1:29 PM

## 2021-10-12 NOTE — ED Triage Notes (Signed)
Pt had stent placed in December 23. Pt c/o of chest pain for past week. Pt two nitro's at 1015 ans 1020. Pain has resolved. Pt denis N/V.

## 2021-10-13 ENCOUNTER — Inpatient Hospital Stay (HOSPITAL_COMMUNITY): Payer: No Typology Code available for payment source

## 2021-10-13 DIAGNOSIS — R079 Chest pain, unspecified: Secondary | ICD-10-CM

## 2021-10-13 LAB — CBC
HCT: 43.2 % (ref 39.0–52.0)
Hemoglobin: 14.5 g/dL (ref 13.0–17.0)
MCH: 29.9 pg (ref 26.0–34.0)
MCHC: 33.6 g/dL (ref 30.0–36.0)
MCV: 89.1 fL (ref 80.0–100.0)
Platelets: 148 10*3/uL — ABNORMAL LOW (ref 150–400)
RBC: 4.85 MIL/uL (ref 4.22–5.81)
RDW: 12.9 % (ref 11.5–15.5)
WBC: 5.1 10*3/uL (ref 4.0–10.5)
nRBC: 0 % (ref 0.0–0.2)

## 2021-10-13 LAB — COMPREHENSIVE METABOLIC PANEL
ALT: 27 U/L (ref 0–44)
AST: 30 U/L (ref 15–41)
Albumin: 3.8 g/dL (ref 3.5–5.0)
Alkaline Phosphatase: 89 U/L (ref 38–126)
Anion gap: 10 (ref 5–15)
BUN: 10 mg/dL (ref 6–20)
CO2: 26 mmol/L (ref 22–32)
Calcium: 9.2 mg/dL (ref 8.9–10.3)
Chloride: 104 mmol/L (ref 98–111)
Creatinine, Ser: 0.94 mg/dL (ref 0.61–1.24)
GFR, Estimated: >60 mL/min (ref >60)
Glucose, Bld: 86 mg/dL (ref 70–99)
Potassium: 3.9 mmol/L (ref 3.5–5.1)
Sodium: 140 mmol/L (ref 135–145)
Total Bilirubin: 1 mg/dL (ref 0.3–1.2)
Total Protein: 5.7 g/dL — ABNORMAL LOW (ref 6.5–8.1)

## 2021-10-13 LAB — ECHOCARDIOGRAM COMPLETE
Area-P 1/2: 3.12 cm2
Height: 71 in
S' Lateral: 3.2 cm
Weight: 3365.1 oz

## 2021-10-13 LAB — LIPID PANEL
Cholesterol: 101 mg/dL (ref 0–200)
HDL: 41 mg/dL (ref >40)
LDL Cholesterol: 52 mg/dL (ref 0–99)
Total CHOL/HDL Ratio: 2.5 RATIO
Triglycerides: 40 mg/dL (ref <150)
VLDL: 8 mg/dL (ref 0–40)

## 2021-10-13 LAB — HEPARIN LEVEL (UNFRACTIONATED): Heparin Unfractionated: 0.37 IU/mL (ref 0.30–0.70)

## 2021-10-13 NOTE — Progress Notes (Signed)
Progress Note  Patient Name: Leroy Campbell. Date of Encounter: 10/13/2021  [facility] Cardiologist: [name], MD   Subjective   No chest pain while on nitro gtt  Inpatient Medications    Scheduled Meds:  allopurinol  200 mg Oral BID   aspirin  81 mg Oral Daily   atorvastatin  80 mg Oral Daily   clopidogrel  75 mg Oral Daily   losartan  25 mg Oral Daily   Continuous Infusions:  heparin 1,200 Units/hr (10/13/21 0637)   nitroGLYCERIN 15 mcg/min (10/12/21 1851)   PRN Meds: acetaminophen, nitroGLYCERIN, ondansetron (ZOFRAN) IV   Vital Signs    Vitals:   10/13/21 0200 10/13/21 0348 10/13/21 0500 10/13/21 0716  BP: 104/60 102/61 106/71 (!) 119/54  Pulse: (!) 52 (!) 54 (!) 47 (!) 56  Resp: '20 17 16 14  '$ Temp:  97.8 F (36.6 C)  97.9 F (36.6 C)  TempSrc:  Oral  Oral  SpO2: 97% 96% 96% 97%  Weight:      Height:        Intake/Output Summary (Last 24 hours) at 10/13/2021 0912 Last data filed at 10/12/2021 1715 Gross per 24 hour  Intake 78.54 ml  Output --  Net 78.54 ml      10/12/2021    5:14 PM 10/12/2021   11:05 AM 07/03/2021    9:57 AM  Last 3 Weights  Weight (lbs) 210 lb 5.1 oz 208 lb 213 lb 13.5 oz  Weight (kg) 95.4 kg 94.348 kg 97 kg      Telemetry    SR - Personally Reviewed  ECG    Sinus brady rate 52 - Personally Reviewed  Physical Exam   GEN: No acute distress.   Neck: No JVD Cardiac: RRR, no murmurs, rubs, or gallops.  Respiratory: Clear to auscultation bilaterally. GI: Soft, nontender, non-distended  MS: No edema; No deformity. Neuro:  Nonfocal  Psych: Normal affect   Labs    High Sensitivity Troponin:   Recent Labs  Lab 10/12/21 1109 10/12/21 1314  TROPONINIHS 5 5     Chemistry Recent Labs  Lab 10/12/21 1109 10/13/21 0206  NA 140 140  K 4.3 3.9  CL 104 104  CO2 30 26  GLUCOSE 111* 86  BUN 13 10  CREATININE 0.93 0.94  CALCIUM 10.4* 9.2  PROT  --  5.7*  ALBUMIN  --  3.8  AST  --  30  ALT  --  27  ALKPHOS   --  89  BILITOT  --  1.0  GFRNONAA >60 >60  ANIONGAP 6 10    Lipids  Recent Labs  Lab 10/13/21 0206  CHOL 101  TRIG 40  HDL 41  LDLCALC 52  CHOLHDL 2.5    Hematology Recent Labs  Lab 10/12/21 1109  WBC 5.2  RBC 5.11  HGB 15.1  HCT 45.3  MCV 88.6  MCH 29.5  MCHC 33.3  RDW 13.1  PLT 173   Thyroid No results for input(s): "TSH", "FREET4" in the last 168 hours.  BNPNo results for input(s): "BNP", "PROBNP" in the last 168 hours.  DDimer No results for input(s): "DDIMER" in the last 168 hours.   Radiology    DG Chest 2 View  Result Date: 10/12/2021 CLINICAL DATA:  Chest pain EXAM: CHEST - 2 VIEW COMPARISON:  05/06/2021 FINDINGS: Cardiac and mediastinal contours are within normal limits. No focal pulmonary opacity. No pleural effusion or pneumothorax. No acute osseous abnormality. IMPRESSION: No acute cardiopulmonary process.  Electronically Signed   By: Merilyn Baba M.D.   On: 10/12/2021 11:39    Cardiac Studies   Echo pending  Patient Profile   Leroy Wolf. is a 61 y.o. 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  EF at that time was 45%.  He now presents to [place] [facility] with complaints of intermittent chest pain for a week proved with nitrates.  We are now asked to evaluate patient for recurrent chest pain, with plan for repeat LHC  Assessment & Plan    Chest pain/ASCAD -Patient status post inferior MI 04/24/2021 with PCI of the RCA with nonobstructive disease of 35% in the proximal LAD, 50% D1 and 20% mid to distal left circumflex status post PCI of the RCA -Brilinta was subsequently changed to Plavix due to a rash which resolved -Now presenting with several weeks/months history of intermittent chest pain resolved with sublingual nitro x2 and not recurring on nitro gtt. -Troponin normal and EKG shows old inferior posterior MI with no acute ST changes -He is in the 39-month timeframe for restenosis so need to consider that he may have developed restenosis of the RCA stent -He has been compliant with all his cardiac meds including aspirin and Plavix -Currently pain-free -Continue home medications including aspirin 81 mg daily, atorvastatin 80 mg daily -We will hold Toprol due to resting bradycardia at 50 bpm currently on telemetry -Continue IV heparin started in the ER -Now on IV nitro drip as he had chest pain [place] [facility] to [facility] by [facility] which we will continue here -Cycle troponins normal. -Repeat 2D echo today. -Recommend left heart cath on Monday or sooner if he becomes unstable to redefine coronary anatomy and stent INFORMED CONSENT: I have reviewed the risks, indications, and alternatives to cardiac catheterization, possible angioplasty, and stenting with the patient. Risks include but are not limited to bleeding, infection, vascular injury, stroke, myocardial infarction, arrhythmia, kidney injury, radiation-related injury in the case of prolonged fluoroscopy use, emergency cardiac surgery, and death. The patient understands the risks of serious complication is 1-2 in 17517with diagnostic cardiac cath and 1-2% or less with angioplasty/stenting.    2.  Hyperlipidemia -LDL goal less than 70 -Continue atorvastatin 80 mg daily -Check FLP and ALT in a.m.   3.  Ischemic cardiomyopathy -EF 45 to 50% with inferior hypokinesis on echo 04/2021 -Continue losartan 25 mg daily  -Hold beta-blocker due to bradycardia -2D echo to reassess LV function. -If EF remains reduced consider transitioning ARB to Entresto and addition of SGLT2i -He does not appear volume overloaded on exam  For questions or updates, please contact [facility] Please consult [email or web address] for contact info under        Signed, GElouise Munroe MD  10/13/2021, 9:12 AM

## 2021-10-13 NOTE — Progress Notes (Signed)
*  PRELIMINARY RESULTS* Echocardiogram 2D Echocardiogram has been performed.  Leroy Campbell 10/13/2021, 3:16 PM

## 2021-10-13 NOTE — Progress Notes (Signed)
ANTICOAGULATION CONSULT NOTE  Pharmacy Consult for heparin Indication: chest pain/ACS  Allergies  Allergen Reactions   Bee Venom Anaphylaxis and Swelling   Brilinta [Ticagrelor] Anaphylaxis and Hives    Patient Measurements: Height: '5\' 11"'$  (180.3 cm) Weight: 95.4 kg (210 lb 5.1 oz) IBW/kg (Calculated) : 75.3 Heparin Dosing Weight: 94.5 kg  Vital Signs: Temp: 97.9 F (36.6 C) (06/10 0716) Temp Source: Oral (06/10 0716) BP: 119/54 (06/10 0716) Pulse Rate: 56 (06/10 0716)  Labs: Recent Labs    10/12/21 1109 10/12/21 1314 10/12/21 1925 10/13/21 0206  HGB 15.1  --   --   --   HCT 45.3  --   --   --   PLT 173  --   --   --   HEPARINUNFRC  --   --  0.50 0.37  CREATININE 0.93  --   --  0.94  TROPONINIHS 5 5  --   --     Estimated Creatinine Clearance: 99.7 mL/min (by C-G formula based on SCr of 0.94 mg/dL).  Medical History: Past Medical History:  Diagnosis Date   Coronary artery disease    Gout    Hyperlipidemia    Ischemic cardiomyopathy    MI (myocardial infarction) ([place]) 04/27/2021    Medications:  Infusions:   heparin 1,200 Units/hr (10/13/21 9518)   nitroGLYCERIN 15 mcg/min (10/12/21 1851)    Assessment: 61 yo M c/o CP over past few weeks with recent MI and PCI to occluded RCA in 04/2021.  Concern for restenosis of RCA.  Not on anticoagulation prior to admission, pt reported compliance with DAPT therapy. Pharmacy consulted for heparin dosing.   Heparin level 0.37 therapeutic on 1200 units/hr.  CBC stable - Hgb 14.5, Plt 148.  No infusion problems or bleeding noted.  Goal of Therapy:  Heparin level 0.3-0.7 units/ml Monitor platelets by anticoagulation protocol: Yes   Plan:  Continue heparin infusion '@1200'$  units/hr Daily CBC, heparin level Monitor for s/sx of bleeding F/u plans for [facility]  Laurey Arrow, PharmD PGY1 Pharmacy Resident 10/13/2021  7:41 AM  Please check [email or web address] for unit-specific pharmacy phone numbers.

## 2021-10-13 NOTE — Progress Notes (Signed)
Patient refused CPAP for the night  

## 2021-10-13 NOTE — Progress Notes (Signed)
Cpap set up at bedside and pt will place himself on/off when ready.  He is aware to call if he needs assistance.  RT will cont to monitor.

## 2021-10-13 NOTE — Plan of Care (Signed)

## 2021-10-14 LAB — LIPOPROTEIN A (LPA): Lipoprotein (a): <8.4 nmol/L (ref <75.0)

## 2021-10-14 LAB — BASIC METABOLIC PANEL
Anion gap: 8 (ref 5–15)
BUN: 9 mg/dL (ref 6–20)
CO2: 27 mmol/L (ref 22–32)
Calcium: 9 mg/dL (ref 8.9–10.3)
Chloride: 104 mmol/L (ref 98–111)
Creatinine, Ser: 1.03 mg/dL (ref 0.61–1.24)
GFR, Estimated: >60 mL/min (ref >60)
Glucose, Bld: 126 mg/dL — ABNORMAL HIGH (ref 70–99)
Potassium: 4.1 mmol/L (ref 3.5–5.1)
Sodium: 139 mmol/L (ref 135–145)

## 2021-10-14 LAB — HEPARIN LEVEL (UNFRACTIONATED)
Heparin Unfractionated: 0.28 IU/mL — ABNORMAL LOW (ref 0.30–0.70)
Heparin Unfractionated: 0.48 IU/mL (ref 0.30–0.70)

## 2021-10-14 LAB — CBC
HCT: 44.7 % (ref 39.0–52.0)
Hemoglobin: 15.5 g/dL (ref 13.0–17.0)
MCH: 30.8 pg (ref 26.0–34.0)
MCHC: 34.7 g/dL (ref 30.0–36.0)
MCV: 88.9 fL (ref 80.0–100.0)
Platelets: 149 10*3/uL — ABNORMAL LOW (ref 150–400)
RBC: 5.03 MIL/uL (ref 4.22–5.81)
RDW: 12.9 % (ref 11.5–15.5)
WBC: 6.4 10*3/uL (ref 4.0–10.5)
nRBC: 0 % (ref 0.0–0.2)

## 2021-10-14 NOTE — Progress Notes (Signed)
[place] for heparin Indication: chest pain/ACS  Allergies  Allergen Reactions   Bee Venom Anaphylaxis and Swelling   Brilinta [Ticagrelor] Anaphylaxis and Hives    Patient Measurements: Height: '5\' 11"'$  (180.3 cm) Weight: 95.4 kg (210 lb 5.1 oz) IBW/kg (Calculated) : 75.3 Heparin Dosing Weight: 94.5 kg  Vital Signs: Temp: 97.9 F (36.6 C) (06/11 0709) Temp Source: Oral (06/11 0709) BP: 116/64 (06/11 0709) Pulse Rate: 57 (06/11 0709)  Labs: Recent Labs    10/12/21 1109 10/12/21 1314 10/12/21 1925 10/13/21 0206 10/13/21 0939 10/14/21 0203  HGB 15.1  --   --   --  14.5 15.5  HCT 45.3  --   --   --  43.2 44.7  PLT 173  --   --   --  148* 149*  HEPARINUNFRC  --   --  0.50 0.37  --  0.28*  CREATININE 0.93  --   --  0.94  --  1.03  TROPONINIHS 5 5  --   --   --   --      Estimated Creatinine Clearance: 91 mL/min (by C-G formula based on SCr of 1.03 mg/dL).  Medical History: Past Medical History:  Diagnosis Date   Coronary artery disease    Gout    Hyperlipidemia    Ischemic cardiomyopathy    MI (myocardial infarction) ([place]) 04/27/2021    Medications:  Infusions:   heparin 1,300 Units/hr (10/14/21 0431)   nitroGLYCERIN 5 mcg/min (10/13/21 1211)    Assessment: 61 yo M c/o CP over past few weeks with recent MI and PCI to occluded RCA in 04/2021.  Concern for restenosis of RCA.  Not on anticoagulation prior to admission, pt reported compliance with DAPT therapy. Pharmacy consulted for heparin dosing.   Heparin level 0.48 therapeutic after rate increase to 1300 units/hr.  Hgb stable, Plt 140s.  No infusion problems or bleeding noted.  Goal of Therapy:  Heparin level 0.3-0.7 units/ml Monitor platelets by anticoagulation protocol: Yes   Plan:  Continue heparin infusion '@1300'$  units/hr Daily CBC, heparin level Monitor for s/sx of bleeding F/u plans for [facility]  Laurey Arrow, PharmD PGY1 Pharmacy Resident 10/14/2021  7:15  AM  Please check [email or web address] for unit-specific pharmacy phone numbers.

## 2021-10-14 NOTE — Plan of Care (Signed)

## 2021-10-14 NOTE — Progress Notes (Signed)
Patient refused CPAP at this time. Equipment at bedside.

## 2021-10-14 NOTE — Progress Notes (Signed)
ANTICOAGULATION CONSULT NOTE - Follow Up Consult  Pharmacy Consult for heparin Indication: chest pain/ACS  Labs: Recent Labs    10/12/21 1109 10/12/21 1314 10/12/21 1925 10/13/21 0206 10/13/21 0939 10/14/21 0203  HGB 15.1  --   --   --  14.5 15.5  HCT 45.3  --   --   --  43.2 44.7  PLT 173  --   --   --  148* 149*  HEPARINUNFRC  --   --  0.50 0.37  --  0.28*  CREATININE 0.93  --   --  0.94  --  1.03  TROPONINIHS 5 5  --   --   --   --     Assessment: 61yo male subtherapeutic on heparin after two levels at goal though had been trending down; no infusion issues or signs of bleeding per RN.  Goal of Therapy:  Heparin level 0.3-0.7 units/ml   Plan:  Will increase heparin infusion by 1 unit/kg/hr to 1300 units/hr and check level in 6 hours.    Wynona Neat, PharmD, BCPS  10/14/2021,3:02 AM

## 2021-10-14 NOTE — Progress Notes (Signed)
Progress Note  Patient Name: Lejuan Botto. Date of Encounter: 10/14/2021  [facility] Cardiologist: [name], MD   Subjective   No CP or dyspnea  Inpatient Medications    Scheduled Meds:  allopurinol  200 mg Oral BID   aspirin  81 mg Oral Daily   atorvastatin  80 mg Oral Daily   clopidogrel  75 mg Oral Daily   losartan  25 mg Oral Daily   Continuous Infusions:  heparin 1,300 Units/hr (10/14/21 0431)   nitroGLYCERIN 5 mcg/min (10/13/21 1211)   PRN Meds: acetaminophen, nitroGLYCERIN, ondansetron (ZOFRAN) IV   Vital Signs    Vitals:   10/13/21 2028 10/13/21 2336 10/14/21 0434 10/14/21 0709  BP: 121/66 128/73 107/69 116/64  Pulse: 60 65 (!) 55 (!) 57  Resp: '20 20 16 19  '$ Temp: 98 F (36.7 C) 98.2 F (36.8 C) 98 F (36.7 C) 97.9 F (36.6 C)  TempSrc: Oral Oral Oral Oral  SpO2: 99% 96% 98% 97%  Weight:      Height:        Intake/Output Summary (Last 24 hours) at 10/14/2021 1018 Last data filed at 10/13/2021 1211 Gross per 24 hour  Intake 496.48 ml  Output --  Net 496.48 ml      10/12/2021    5:14 PM 10/12/2021   11:05 AM 07/03/2021    9:57 AM  Last 3 Weights  Weight (lbs) 210 lb 5.1 oz 208 lb 213 lb 13.5 oz  Weight (kg) 95.4 kg 94.348 kg 97 kg      Telemetry    Sinus bradycardia - Personally Reviewed  Physical Exam   GEN: No acute distress.   Neck: No JVD Cardiac: RRR, no murmurs, rubs, or gallops.  Respiratory: Clear to auscultation bilaterally. GI: Soft, nontender, non-distended  MS: No edema Neuro:  Nonfocal  Psych: Normal affect   Labs    High Sensitivity Troponin:   Recent Labs  Lab 10/12/21 1109 10/12/21 1314  TROPONINIHS 5 5     Chemistry Recent Labs  Lab 10/12/21 1109 10/13/21 0206 10/14/21 0203  NA 140 140 139  K 4.3 3.9 4.1  CL 104 104 104  CO2 '30 26 27  '$ GLUCOSE 111* 86 126*  BUN '13 10 9  '$ CREATININE 0.93 0.94 1.03  CALCIUM 10.4* 9.2 9.0  PROT  --  5.7*  --   ALBUMIN  --  3.8  --   AST  --  30  --    ALT  --  27  --   ALKPHOS  --  89  --   BILITOT  --  1.0  --   GFRNONAA >60 >60 >60  ANIONGAP '6 10 8    '$ Lipids  Recent Labs  Lab 10/13/21 0206  CHOL 101  TRIG 40  HDL 41  LDLCALC 52  CHOLHDL 2.5    Hematology Recent Labs  Lab 10/12/21 1109 10/13/21 0939 10/14/21 0203  WBC 5.2 5.1 6.4  RBC 5.11 4.85 5.03  HGB 15.1 14.5 15.5  HCT 45.3 43.2 44.7  MCV 88.6 89.1 88.9  MCH 29.5 29.9 30.8  MCHC 33.3 33.6 34.7  RDW 13.1 12.9 12.9  PLT 173 148* 149*    Radiology    ECHOCARDIOGRAM COMPLETE  Result Date: 10/13/2021    ECHOCARDIOGRAM REPORT   Patient Name:   Jamauri Kruzel. Date of Exam: 10/13/2021 Medical Rec #:  [id number]           Height:  71.0 in Accession #:    [id number]          Weight:       210.3 lb Date of Birth:  27-Nov-1960           BSA:          2.154 m Patient Age:    61 years            BP:           129/70 mmHg Patient Gender: M                   HR:           62 bpm. Exam Location:  Inpatient Procedure: 2D Echo, Cardiac Doppler and Color Doppler Indications:    R07.9 Chest Pain  History:        Patient has prior history of Echocardiogram examinations, most                 recent 04/28/2021. Ischemic Cardiomyopathy, Previous Myocardial                 Infarction and CAD; Risk Factors:Dyslipidemia.  Sonographer:    Alvino Chapel RCS Referring Phys: [id number] [place]  1. Left ventricular ejection fraction, by estimation, is 55 to 60%. The left ventricle has normal function. The left ventricle has no regional wall motion abnormalities. Left ventricular diastolic parameters are consistent with Grade I diastolic dysfunction (impaired relaxation).  2. Right ventricular systolic function is normal. The right ventricular size is normal.  3. The mitral valve is normal in structure. No evidence of mitral valve regurgitation. No evidence of mitral stenosis.  4. The aortic valve is tricuspid. Aortic valve regurgitation is not visualized. No aortic stenosis  is present.  5. The inferior vena cava is normal in size with greater than 50% respiratory variability, suggesting right atrial pressure of 3 mmHg. FINDINGS  Left Ventricle: Left ventricular ejection fraction, by estimation, is 55 to 60%. The left ventricle has normal function. The left ventricle has no regional wall motion abnormalities. The left ventricular internal cavity size was normal in size. There is  no left ventricular hypertrophy. Left ventricular diastolic parameters are consistent with Grade I diastolic dysfunction (impaired relaxation). Right Ventricle: The right ventricular size is normal. Right ventricular systolic function is normal. Left Atrium: Left atrial size was normal in size. Right Atrium: Right atrial size was normal in size. Pericardium: There is no evidence of pericardial effusion. Mitral Valve: The mitral valve is normal in structure. No evidence of mitral valve regurgitation. No evidence of mitral valve stenosis. Tricuspid Valve: The tricuspid valve is normal in structure. Tricuspid valve regurgitation is trivial. No evidence of tricuspid stenosis. Aortic Valve: The aortic valve is tricuspid. Aortic valve regurgitation is not visualized. No aortic stenosis is present. Pulmonic Valve: The pulmonic valve was normal in structure. Pulmonic valve regurgitation is trivial. No evidence of pulmonic stenosis. Aorta: The aortic root is normal in size and structure. Venous: The inferior vena cava is normal in size with greater than 50% respiratory variability, suggesting right atrial pressure of 3 mmHg. IAS/Shunts: No atrial level shunt detected by color flow Doppler.  LEFT VENTRICLE PLAX 2D LVIDd:         4.70 cm   Diastology LVIDs:         3.20 cm   LV e' medial:    6.99 cm/s LV PW:         1.10 cm  LV E/e' medial:  13.1 LV IVS:        1.10 cm   LV e' lateral:   11.50 cm/s LVOT diam:     2.10 cm   LV E/e' lateral: 8.0 LV SV:         95 LV SV Index:   44 LVOT Area:     3.46 cm  RIGHT VENTRICLE  RV S prime:     13.50 cm/s TAPSE (M-mode): 2.7 cm LEFT ATRIUM           Index        RIGHT ATRIUM           Index LA diam:      3.30 cm 1.53 cm/m   RA Area:     18.10 cm LA Vol (A2C): 49.0 ml 22.75 ml/m  RA Volume:   52.00 ml  24.14 ml/m LA Vol (A4C): 53.1 ml 24.65 ml/m  AORTIC VALVE LVOT Vmax:   128.00 cm/s LVOT Vmean:  77.000 cm/s LVOT VTI:    0.274 m  AORTA Ao Root diam: 3.70 cm MITRAL VALVE MV Area (PHT): 3.12 cm     SHUNTS MV Decel Time: 243 msec     Systemic VTI:  0.27 m MV E velocity: 91.70 cm/s   Systemic Diam: 2.10 cm MV A velocity: 104.00 cm/s MV E/A ratio:  0.88 Kirk Ruths MD Electronically signed by Kirk Ruths MD Signature Date/Time: 10/13/2021/4:01:12 PM    Final    DG Chest 2 View  Result Date: 10/12/2021 CLINICAL DATA:  Chest pain EXAM: CHEST - 2 VIEW COMPARISON:  05/06/2021 FINDINGS: Cardiac and mediastinal contours are within normal limits. No focal pulmonary opacity. No pleural effusion or pneumothorax. No acute osseous abnormality. IMPRESSION: No acute cardiopulmonary process. Electronically Signed   By: Merilyn Baba M.D.   On: 10/12/2021 11:39     Patient Profile   Aaronjames Kelsay. is a 61 y.o. male with a history of ASCAD status post inferior wall MI and subsequent PCI of an occluded RCA in December 2022.  EF at that time was 45%.  He now presents to [facility] with complaints of intermittent chest pain for a week improved with nitrates.  Echo this admission shows normal LV function, grade 1 DD.  Assessment & Plan    1 UA-pt remains pain free; continue ASA, plavix, statin, heparin, and IV NTG. Proceed with cath tomorrow. Risks and benefits including CVA, MI and death discussed and pt agrees to proceed.  2 hyperlipidemia-continue statin.  3 H/O mildly reduced LV function- LV function normal on fu echo; continue ARB; beta blocker DCed due to bradycardia.  For questions or updates, please contact [place] Please consult [email or web address] for contact  info under        Signed, Kirk Ruths, MD  10/14/2021, 10:18 AM

## 2021-10-15 ENCOUNTER — Encounter (HOSPITAL_COMMUNITY): Payer: Self-pay | Admitting: Cardiology

## 2021-10-15 ENCOUNTER — Ambulatory Visit: Payer: No Typology Code available for payment source | Admitting: Physician Assistant

## 2021-10-15 ENCOUNTER — Encounter (HOSPITAL_COMMUNITY)
Admission: EM | Disposition: A | Discharge status: Home / Self Care | Payer: Self-pay | Source: Home / Self Care | Attending: Cardiology

## 2021-10-15 DIAGNOSIS — I2510 Atherosclerotic heart disease of native coronary artery without angina pectoris: Secondary | ICD-10-CM | POA: Diagnosis present

## 2021-10-15 DIAGNOSIS — I252 Old myocardial infarction: Secondary | ICD-10-CM

## 2021-10-15 DIAGNOSIS — I200 Unstable angina: Secondary | ICD-10-CM

## 2021-10-15 DIAGNOSIS — I25110 Atherosclerotic heart disease of native coronary artery with unstable angina pectoris: Principal | ICD-10-CM

## 2021-10-15 HISTORY — DX: Old myocardial infarction: I25.2

## 2021-10-15 HISTORY — DX: Atherosclerotic heart disease of native coronary artery without angina pectoris: I25.10

## 2021-10-15 HISTORY — PX: LEFT HEART CATH AND CORONARY ANGIOGRAPHY: CATH118249

## 2021-10-15 LAB — CBC
HCT: 44.5 % (ref 39.0–52.0)
Hemoglobin: 15.3 g/dL (ref 13.0–17.0)
MCH: 30.8 pg (ref 26.0–34.0)
MCHC: 34.4 g/dL (ref 30.0–36.0)
MCV: 89.7 fL (ref 80.0–100.0)
Platelets: 143 10*3/uL — ABNORMAL LOW (ref 150–400)
RBC: 4.96 MIL/uL (ref 4.22–5.81)
RDW: 12.9 % (ref 11.5–15.5)
WBC: 6.7 10*3/uL (ref 4.0–10.5)
nRBC: 0 % (ref 0.0–0.2)

## 2021-10-15 LAB — BASIC METABOLIC PANEL
Anion gap: 6 (ref 5–15)
BUN: 13 mg/dL (ref 6–20)
CO2: 26 mmol/L (ref 22–32)
Calcium: 8.8 mg/dL — ABNORMAL LOW (ref 8.9–10.3)
Chloride: 107 mmol/L (ref 98–111)
Creatinine, Ser: 1.17 mg/dL (ref 0.61–1.24)
GFR, Estimated: >60 mL/min (ref >60)
Glucose, Bld: 100 mg/dL — ABNORMAL HIGH (ref 70–99)
Potassium: 4.4 mmol/L (ref 3.5–5.1)
Sodium: 139 mmol/L (ref 135–145)

## 2021-10-15 LAB — HEPARIN LEVEL (UNFRACTIONATED): Heparin Unfractionated: 0.45 IU/mL (ref 0.30–0.70)

## 2021-10-15 SURGERY — LEFT HEART CATH AND CORONARY ANGIOGRAPHY
Anesthesia: LOCAL

## 2021-10-15 MED ORDER — ONDANSETRON HCL 4 MG/2ML IJ SOLN: 4.0000 mg | Freq: Four times a day (QID) | INTRAMUSCULAR | Status: DC | PRN

## 2021-10-15 MED ORDER — SODIUM CHLORIDE 0.9 % IV SOLN: INTRAVENOUS | Status: DC

## 2021-10-15 MED ORDER — FENTANYL CITRATE (PF) 100 MCG/2ML IJ SOLN
INTRAMUSCULAR | Status: AC
  Filled 2021-10-15: qty 2

## 2021-10-15 MED ORDER — VERAPAMIL HCL 2.5 MG/ML IV SOLN
INTRAVENOUS | Status: AC
  Filled 2021-10-15: qty 2

## 2021-10-15 MED ORDER — FENTANYL CITRATE (PF) 100 MCG/2ML IJ SOLN
INTRAMUSCULAR | Status: DC | PRN
  Administered 2021-10-15 (×2): 25 ug via INTRAVENOUS

## 2021-10-15 MED ORDER — SODIUM CHLORIDE 0.9% FLUSH: 3.0000 mL | Freq: Two times a day (BID) | INTRAVENOUS | Status: DC

## 2021-10-15 MED ORDER — ACETAMINOPHEN 325 MG PO TABS: 650.0000 mg | ORAL_TABLET | ORAL | Status: DC | PRN

## 2021-10-15 MED ORDER — VERAPAMIL HCL 2.5 MG/ML IV SOLN
INTRAVENOUS | Status: DC | PRN
  Administered 2021-10-15: 10 mL via INTRA_ARTERIAL

## 2021-10-15 MED ORDER — CLOPIDOGREL BISULFATE 75 MG PO TABS: 75.0000 mg | ORAL_TABLET | Freq: Every day | ORAL | Status: DC

## 2021-10-15 MED ORDER — SODIUM CHLORIDE 0.9 % IV SOLN: 250.0000 mL | INTRAVENOUS | Status: DC | PRN

## 2021-10-15 MED ORDER — SODIUM CHLORIDE 0.9 % WEIGHT BASED INFUSION: 3.0000 mL/kg/h | INTRAVENOUS | Status: DC

## 2021-10-15 MED ORDER — ISOSORBIDE MONONITRATE ER 30 MG PO TB24: 30.0000 mg | ORAL_TABLET | Freq: Every day | ORAL | 11 refills | Status: DC

## 2021-10-15 MED ORDER — SODIUM CHLORIDE 0.9 % WEIGHT BASED INFUSION
1.0000 mL/kg/h | INTRAVENOUS | Status: DC
  Administered 2021-10-15: 1 mL/kg/h via INTRAVENOUS

## 2021-10-15 MED ORDER — MIDAZOLAM HCL 2 MG/2ML IJ SOLN
INTRAMUSCULAR | Status: AC
  Filled 2021-10-15: qty 2

## 2021-10-15 MED ORDER — HEPARIN SODIUM (PORCINE) 1000 UNIT/ML IJ SOLN
INTRAMUSCULAR | Status: DC | PRN
  Administered 2021-10-15: 5000 [IU] via INTRAVENOUS

## 2021-10-15 MED ORDER — MIDAZOLAM HCL 2 MG/2ML IJ SOLN
INTRAMUSCULAR | Status: DC | PRN
  Administered 2021-10-15: 2 mg via INTRAVENOUS
  Administered 2021-10-15: 1 mg via INTRAVENOUS

## 2021-10-15 MED ORDER — IOHEXOL 350 MG/ML SOLN
INTRAVENOUS | Status: DC | PRN
  Administered 2021-10-15: 55 mL

## 2021-10-15 MED ORDER — HEPARIN SODIUM (PORCINE) 1000 UNIT/ML IJ SOLN
INTRAMUSCULAR | Status: AC
  Filled 2021-10-15: qty 10

## 2021-10-15 MED ORDER — SODIUM CHLORIDE 0.9% FLUSH: 3.0000 mL | INTRAVENOUS | Status: DC | PRN

## 2021-10-15 MED ORDER — HEPARIN (PORCINE) IN NACL 1000-0.9 UT/500ML-% IV SOLN
INTRAVENOUS | Status: DC | PRN
  Administered 2021-10-15 (×2): 500 mL

## 2021-10-15 MED ORDER — HYDRALAZINE HCL 20 MG/ML IJ SOLN: 10.0000 mg | INTRAMUSCULAR | Status: DC | PRN

## 2021-10-15 MED ORDER — LIDOCAINE HCL (PF) 1 % IJ SOLN
INTRAMUSCULAR | Status: DC | PRN
  Administered 2021-10-15 (×4): 2 mL

## 2021-10-15 MED ORDER — METOPROLOL SUCCINATE ER 25 MG PO TB24: 12.5000 mg | ORAL_TABLET | Freq: Every day | ORAL | 11 refills | Status: DC

## 2021-10-15 MED ORDER — LIDOCAINE HCL (PF) 1 % IJ SOLN
INTRAMUSCULAR | Status: AC
  Filled 2021-10-15: qty 30

## 2021-10-15 MED ORDER — NITROGLYCERIN 1 MG/10 ML FOR IR/CATH LAB
INTRA_ARTERIAL | Status: AC
  Filled 2021-10-15: qty 10

## 2021-10-15 MED ORDER — HEPARIN (PORCINE) IN NACL 1000-0.9 UT/500ML-% IV SOLN
INTRAVENOUS | Status: AC
  Filled 2021-10-15: qty 1000

## 2021-10-15 MED ORDER — NITROGLYCERIN 1 MG/10 ML FOR IR/CATH LAB
INTRA_ARTERIAL | Status: DC | PRN
  Administered 2021-10-15: 100 ug

## 2021-10-15 MED ORDER — LABETALOL HCL 5 MG/ML IV SOLN: 10.0000 mg | INTRAVENOUS | Status: DC | PRN

## 2021-10-15 SURGICAL SUPPLY — 10 items
BAND ZEPHYR COMPRESS 30 LONG (HEMOSTASIS) ×1 IMPLANT
CATH 5FR JL3.5 JR4 ANG PIG MP (CATHETERS) ×1 IMPLANT
GLIDESHEATH SLEND SS 6F .021 (SHEATH) ×1 IMPLANT
GUIDEWIRE INQWIRE 1.5J.035X260 (WIRE) IMPLANT
INQWIRE 1.5J .035X260CM (WIRE) ×2
KIT HEART LEFT (KITS) ×2 IMPLANT
PACK CARDIAC CATHETERIZATION (CUSTOM PROCEDURE TRAY) ×2 IMPLANT
SHEATH PROBE COVER 6X72 (BAG) ×1 IMPLANT
TRANSDUCER W/STOPCOCK (MISCELLANEOUS) ×2 IMPLANT
TUBING CIL FLEX 10 FLL-RA (TUBING) ×2 IMPLANT

## 2021-10-15 NOTE — Interval H&P Note (Signed)
Cath Lab Visit (complete for each Cath Lab visit)  Clinical Evaluation Leading to the Procedure:   ACS: Yes.    Non-ACS:    Anginal Classification: CCS IV  Anti-ischemic medical therapy: Minimal Therapy (1 class of medications)  Non-Invasive Test Results: No non-invasive testing performed  Prior CABG: No previous CABG      History and Physical Interval Note:  10/15/2021 1:18 PM  [name].  has presented today for surgery, with the diagnosis of unstable angina.  The various methods of treatment have been discussed with the patient and family. After consideration of risks, benefits and other options for treatment, the patient has consented to  Procedure(s): LEFT HEART CATH AND CORONARY ANGIOGRAPHY (N/A) as a surgical intervention.  The patient's history has been reviewed, patient examined, no change in status, stable for surgery.  I have reviewed the patient's chart and labs.  Questions were answered to the patient's satisfaction.     Larae Grooms

## 2021-10-15 NOTE — Discharge Summary (Addendum)
Discharge Summary    Patient ID: Leroy Campbell. MRN: [id number]; DOB: 09-Sep-1960  Admit date: 10/12/2021 Discharge date: 10/15/2021  PCP:  [name], PA   [facility] Providers Cardiologist:  [name], MD        Discharge Diagnoses    Principal Problem:   Unstable angina [facility]) Active Problems:   Hyperlipidemia   CAD in native artery   Hx of myocardial infarction 04/2021     Diagnostic Studies/Procedures    Cardiac cath 10/15/21   Prox LAD lesion is 35% stenosed.   1st Diag lesion is 50% stenosed.   Mid Cx to Dist Cx lesion is 30% stenosed.   Widely patent mid RCA stent   The left ventricular systolic function is normal.   LV end diastolic pressure is low.   The left ventricular ejection fraction is 55-65% by visual estimate.   There is no aortic valve stenosis.   Widely patent RCA stent.  Mild to moderate diffuse nonobstructive disease in the left system, essentially unchanged from prior catheterization.  Continue aggressive risk factor management and medical therapy.    Diagnostic Dominance: Right  _____________   Echo 10/13/21 IMPRESSIONS     1. Left ventricular ejection fraction, by estimation, is 55 to 60%. The  left ventricle has normal function. The left ventricle has no regional  wall motion abnormalities. Left ventricular diastolic parameters are  consistent with Grade I diastolic  dysfunction (impaired relaxation).   2. Right ventricular systolic function is normal. The right ventricular  size is normal.   3. The mitral valve is normal in structure. No evidence of mitral valve  regurgitation. No evidence of mitral stenosis.   4. The aortic valve is tricuspid. Aortic valve regurgitation is not  visualized. No aortic stenosis is present.   5. The inferior vena cava is normal in size with greater than 50%  respiratory variability, suggesting right atrial pressure of 3 mmHg.   FINDINGS   Left Ventricle: Left ventricular ejection  fraction, by estimation, is 55  to 60%. The left ventricle has normal function. The left ventricle has no  regional wall motion abnormalities. The left ventricular internal cavity  size was normal in size. There is   no left ventricular hypertrophy. Left ventricular diastolic parameters  are consistent with Grade I diastolic dysfunction (impaired relaxation).   Right Ventricle: The right ventricular size is normal. Right ventricular  systolic function is normal.   Left Atrium: Left atrial size was normal in size.   Right Atrium: Right atrial size was normal in size.   Pericardium: There is no evidence of pericardial effusion.   Mitral Valve: The mitral valve is normal in structure. No evidence of  mitral valve regurgitation. No evidence of mitral valve stenosis.   Tricuspid Valve: The tricuspid valve is normal in structure. Tricuspid  valve regurgitation is trivial. No evidence of tricuspid stenosis.   Aortic Valve: The aortic valve is tricuspid. Aortic valve regurgitation is  not visualized. No aortic stenosis is present.   Pulmonic Valve: The pulmonic valve was normal in structure. Pulmonic valve  regurgitation is trivial. No evidence of pulmonic stenosis.   Aorta: The aortic root is normal in size and structure.   Venous: The inferior vena cava is normal in size with greater than 50%  respiratory variability, suggesting right atrial pressure of 3 mmHg.   IAS/Shunts: No atrial level shunt detected by color flow Doppler.  History of Present Illness     Leroy Campbell  [name]. is a 61 y.o. male with history of ASCAD status post inferior wall MI and subsequent PCI of an occluded RCA and December 2022.  EF at that time was 45%.  His brilinta changed to Plavix due to hives.  He had been doing well until last week and was having episodes of chest pain.  A discomfort. No radiation, + fatigue and color was not good.  He did have some nausea.  Prior to presenting to ER at [facility] he took 2 NTG with significant Improvement though mild residual pain.   EKG showed sinus bradycardia 51 bpm with inferior posterior infarct old with no acute ST changes.  With recurrent pain he had IV NTG started.    With 6 month window for restenosis of stent he was admitted with plan for cardiac cath.   Hs troponin was 5 and 5.   Hospital Course     Consultants: none  Pt did well over the weekend and today underwent cardiac cath without complications.   Results as above.  His echo revealed return to normal of his EF that with MI was 45%.  He was also found to be bradycardic so his BB was held,  it will be resumed tomorrow at 12.5 mg daily and instructions to hold and/or call for HR < 55. Or symptomatic.   Lipids are well controlled with LDL 52.       Post cath he has been seen and evaluated by Dr. Margaretann Loveless and found to stable for discharge.  Imdur 30 was also added to medication.  Did the patient have an acute coronary syndrome (MI, NSTEMI, STEMI, etc) this admission?:  No                               Did the patient have a percutaneous coronary intervention (stent / angioplasty)?:  No.          _____________  Discharge Vitals Blood pressure 114/74, pulse 60, temperature 97.6 F (36.4 C), temperature source Oral, resp. rate (!) 21, height '5\' 11"'$  (1.803 m), weight 95.4 kg, SpO2 94 %.  Filed Weights   10/12/21 1105 10/12/21 1714  Weight: 94.3 kg 95.4 kg    Labs & Radiologic Studies    CBC Recent Labs    10/14/21 0203 10/15/21 0158  WBC 6.4 6.7  HGB 15.5 15.3  HCT 44.7 44.5  MCV 88.9 89.7  PLT 149* 671*   Basic Metabolic Panel Recent Labs    10/14/21 0203 10/15/21 0158  NA 139 139  K 4.1 4.4  CL 104 107  CO2 27 26  GLUCOSE 126* 100*  BUN 9 13  CREATININE 1.03 1.17  CALCIUM 9.0 8.8*   Liver Function Tests Recent Labs    10/13/21 0206  AST 30  ALT 27  ALKPHOS 89  BILITOT 1.0  PROT 5.7*  ALBUMIN 3.8   No results for input(s): "LIPASE",  "AMYLASE" in the last 72 hours. High Sensitivity Troponin:   Recent Labs  Lab 10/12/21 1109 10/12/21 1314  TROPONINIHS 5 5    BNP Invalid input(s): "POCBNP" D-Dimer No results for input(s): "DDIMER" in the last 72 hours. Hemoglobin A1C No results for input(s): "HGBA1C" in the last 72 hours. Fasting Lipid Panel Recent Labs    10/13/21 0206  CHOL 101  HDL 41  LDLCALC 52  TRIG 40  CHOLHDL 2.5   Thyroid Function Tests No results for input(s): "TSH", "T4TOTAL", "T3FREE", "  THYROIDAB" in the last 72 hours.  Invalid input(s): "FREET3" _____________  CARDIAC CATHETERIZATION  Result Date: 10/15/2021   Prox LAD lesion is 35% stenosed.   1st Diag lesion is 50% stenosed.   Mid Cx to Dist Cx lesion is 30% stenosed.   Widely patent mid RCA stent   The left ventricular systolic function is normal.   LV end diastolic pressure is low.   The left ventricular ejection fraction is 55-65% by visual estimate.   There is no aortic valve stenosis. Widely patent RCA stent.  Mild to moderate diffuse nonobstructive disease in the left system, essentially unchanged from prior catheterization.  Continue aggressive risk factor management and medical therapy.    ECHOCARDIOGRAM COMPLETE  Result Date: 10/13/2021    ECHOCARDIOGRAM REPORT   Patient Name:   Vikash Nest. Date of Exam: 10/13/2021 Medical Rec #:  [id number]           Height:       71.0 in Accession #:    [id number]          Weight:       210.3 lb Date of Birth:  [birth year]           BSA:          2.154 m Patient Age:    30 years            BP:           129/70 mmHg Patient Gender: M                   HR:           62 bpm. Exam Location:  Inpatient Procedure: 2D Echo, Cardiac Doppler and Color Doppler Indications:    R07.9 Chest Pain  History:        Patient has prior history of Echocardiogram examinations, most                 recent 04/28/2021. Ischemic Cardiomyopathy, Previous Myocardial                 Infarction and CAD; Risk  Factors:Dyslipidemia.  Sonographer:    Alvino Chapel RCS Referring Phys: [id number] [place]  1. Left ventricular ejection fraction, by estimation, is 55 to 60%. The left ventricle has normal function. The left ventricle has no regional wall motion abnormalities. Left ventricular diastolic parameters are consistent with Grade I diastolic dysfunction (impaired relaxation).  2. Right ventricular systolic function is normal. The right ventricular size is normal.  3. The mitral valve is normal in structure. No evidence of mitral valve regurgitation. No evidence of mitral stenosis.  4. The aortic valve is tricuspid. Aortic valve regurgitation is not visualized. No aortic stenosis is present.  5. The inferior vena cava is normal in size with greater than 50% respiratory variability, suggesting right atrial pressure of 3 mmHg. FINDINGS  Left Ventricle: Left ventricular ejection fraction, by estimation, is 55 to 60%. The left ventricle has normal function. The left ventricle has no regional wall motion abnormalities. The left ventricular internal cavity size was normal in size. There is  no left ventricular hypertrophy. Left ventricular diastolic parameters are consistent with Grade I diastolic dysfunction (impaired relaxation). Right Ventricle: The right ventricular size is normal. Right ventricular systolic function is normal. Left Atrium: Left atrial size was normal in size. Right Atrium: Right atrial size was normal in size. Pericardium: There is no evidence of pericardial effusion. Mitral Valve: The mitral valve  is normal in structure. No evidence of mitral valve regurgitation. No evidence of mitral valve stenosis. Tricuspid Valve: The tricuspid valve is normal in structure. Tricuspid valve regurgitation is trivial. No evidence of tricuspid stenosis. Aortic Valve: The aortic valve is tricuspid. Aortic valve regurgitation is not visualized. No aortic stenosis is present. Pulmonic Valve: The pulmonic  valve was normal in structure. Pulmonic valve regurgitation is trivial. No evidence of pulmonic stenosis. Aorta: The aortic root is normal in size and structure. Venous: The inferior vena cava is normal in size with greater than 50% respiratory variability, suggesting right atrial pressure of 3 mmHg. IAS/Shunts: No atrial level shunt detected by color flow Doppler.  LEFT VENTRICLE PLAX 2D LVIDd:         4.70 cm   Diastology LVIDs:         3.20 cm   LV e' medial:    6.99 cm/s LV PW:         1.10 cm   LV E/e' medial:  13.1 LV IVS:        1.10 cm   LV e' lateral:   11.50 cm/s LVOT diam:     2.10 cm   LV E/e' lateral: 8.0 LV SV:         95 LV SV Index:   44 LVOT Area:     3.46 cm  RIGHT VENTRICLE RV S prime:     13.50 cm/s TAPSE (M-mode): 2.7 cm LEFT ATRIUM           Index        RIGHT ATRIUM           Index LA diam:      3.30 cm 1.53 cm/m   RA Area:     18.10 cm LA Vol (A2C): 49.0 ml 22.75 ml/m  RA Volume:   52.00 ml  24.14 ml/m LA Vol (A4C): 53.1 ml 24.65 ml/m  AORTIC VALVE LVOT Vmax:   128.00 cm/s LVOT Vmean:  77.000 cm/s LVOT VTI:    0.274 m  AORTA Ao Root diam: 3.70 cm MITRAL VALVE MV Area (PHT): 3.12 cm     SHUNTS MV Decel Time: 243 msec     Systemic VTI:  0.27 m MV E velocity: 91.70 cm/s   Systemic Diam: 2.10 cm MV A velocity: 104.00 cm/s MV E/A ratio:  0.88 Kirk Ruths MD Electronically signed by Kirk Ruths MD Signature Date/Time: 10/13/2021/4:01:12 PM    Final    DG Chest 2 View  Result Date: 10/12/2021 CLINICAL DATA:  Chest pain EXAM: CHEST - 2 VIEW COMPARISON:  05/06/2021 FINDINGS: Cardiac and mediastinal contours are within normal limits. No focal pulmonary opacity. No pleural effusion or pneumothorax. No acute osseous abnormality. IMPRESSION: No acute cardiopulmonary process. Electronically Signed   By: Merilyn Baba M.D.   On: 10/12/2021 11:39    Disposition   Pt is being discharged home today in good condition.  Follow-up Plans & Appointments  Call [facility] at [phone] if any bleeding, swelling or drainage at cath site.  May shower, no tub baths for 48 hours for groin sticks. No lifting over 5 pounds for 3 days.  No Driving for 3 days  Heart Healthy Diet  Continue the plavix and ASA   Call if any problems prior to app.  Call [facility] at [phone] if any bleeding, swelling or drainage at cath site.  May shower, no tub baths for 48 hours for groin sticks. No lifting  over 5 pounds for 3 days.  No Driving for 3 days  Heart Healthy Diet  Continue the plavix and ASA   Call if any problems prior to app.  We decreased your metoprolol to 12.5 mg daily begin 10/16/21 - if your heart rate is in low 50s and you feel weak and tired you could stop. We prefer you take if you feel ok.      Follow-up Information     [name], MD Follow up on 11/09/2021.   Specialty: Cardiology Why: at 8:40 AM Contact information: [address] [place] [address] [phone]                  Discharge Medications   Allergies as of 10/15/2021       Reactions   Bee Venom Anaphylaxis, Swelling   Brilinta [ticagrelor] Anaphylaxis, Hives        Medication List     STOP taking these medications    naproxen sodium 220 MG tablet Commonly known as: ALEVE       TAKE these medications    allopurinol 100 MG tablet Commonly known as: ZYLOPRIM Take 200 mg by mouth 2 (two) times daily.   aspirin 81 MG chewable tablet Chew 1 tablet (81 mg total) by mouth daily.   atorvastatin 80 MG tablet Commonly known as: LIPITOR Take 1 tablet (80 mg total) by mouth daily.   clopidogrel 75 MG tablet Commonly known as: PLAVIX Take 1 tablet (75 mg total) by mouth daily. Start taking on: October 16, 2021   colchicine 0.6 MG tablet Take 0.6 mg by mouth daily as needed (gout).   isosorbide mononitrate 30 MG 24 hr tablet Commonly known as: IMDUR Take 1 tablet (30 mg total) by mouth daily.    losartan 25 MG tablet Commonly known as: COZAAR Take 1 tablet (25 mg total) by mouth daily.   metoprolol succinate 25 MG 24 hr tablet Commonly known as: TOPROL-XL Take 0.5 tablets (12.5 mg total) by mouth daily. What changed: how much to take   nitroGLYCERIN 0.4 MG SL tablet Commonly known as: NITROSTAT Place 1 tablet (0.4 mg total) under the tongue every 5 (five) minutes x 3 doses as needed for chest pain.   vitamin B-12 1000 MCG tablet Commonly known as: CYANOCOBALAMIN Take 1,000 mcg by mouth 3 (three) times a week.   vitamin C 500 MG tablet Commonly known as: ASCORBIC ACID Take 500 mg by mouth daily.           Outstanding Labs/Studies   Consider BMP  Duration of Discharge Encounter   Greater than 30 minutes including physician time.  Signed, Cecilie Kicks, NP 10/15/2021, 5:31 PM

## 2021-10-15 NOTE — Discharge Instructions (Addendum)
Call [facility] at [phone] if any bleeding, swelling or drainage at cath site.  May shower, no tub baths for 48 hours for groin sticks. No lifting over 5 pounds for 3 days.  No Driving for 3 days  Heart Healthy Diet  Continue the plavix and ASA   Call if any problems prior to app.  We decreased your metoprolol to 12.5 mg daily begin 10/16/21 - if your heart rate is in low 50s and you feel weak and tired you could stop. We prefer you take if you feel ok.

## 2021-10-15 NOTE — Progress Notes (Signed)
ANTICOAGULATION CONSULT NOTE  Pharmacy Consult for heparin Indication: chest pain/ACS  Allergies  Allergen Reactions   Bee Venom Anaphylaxis and Swelling   Brilinta [Ticagrelor] Anaphylaxis and Hives    Patient Measurements: Height: '5\' 11"'$  (180.3 cm) Weight: 95.4 kg (210 lb 5.1 oz) IBW/kg (Calculated) : 75.3 Heparin Dosing Weight: 94.5 kg  Vital Signs: Temp: 98.7 F (37.1 C) (06/12 0453) Temp Source: Oral (06/12 0453) BP: 103/64 (06/12 0453) Pulse Rate: 58 (06/12 0045)  Labs: Recent Labs    10/12/21 1109 10/12/21 1314 10/12/21 1925 10/13/21 0206 10/13/21 0939 10/14/21 0203 10/14/21 0842 10/15/21 0158  HGB 15.1  --   --   --  14.5 15.5  --  15.3  HCT 45.3  --   --   --  43.2 44.7  --  44.5  PLT 173  --   --   --  148* 149*  --  143*  HEPARINUNFRC  --   --    < > 0.37  --  0.28* 0.48 0.45  CREATININE 0.93  --   --  0.94  --  1.03  --  1.17  TROPONINIHS 5 5  --   --   --   --   --   --    < > = values in this interval not displayed.     Estimated Creatinine Clearance: 80.1 mL/min (by C-G formula based on SCr of 1.17 mg/dL).  Medical History: Past Medical History:  Diagnosis Date   Coronary artery disease    Gout    Hyperlipidemia    Ischemic cardiomyopathy    MI (myocardial infarction) ([place]) 04/27/2021    Medications:  Infusions:   heparin 1,300 Units/hr (10/14/21 2103)   nitroGLYCERIN 5 mcg/min (10/13/21 1211)    Assessment: 61 yo M c/o CP over past few weeks with recent MI and PCI to occluded RCA in 04/2021.  Concern for restenosis of RCA.  Not on anticoagulation prior to admission, pt reported compliance with DAPT therapy. Pharmacy consulted for heparin dosing.   Heparin level 0.45 therapeutic after rate increase to 1300 units/hr.  Hgb stable. No infusion problems or bleeding noted.  Goal of Therapy:  Heparin level 0.3-0.7 units/ml Monitor platelets by anticoagulation protocol: Yes   Plan:  Continue heparin infusion '@1300'$  units/hr Daily CBC,  heparin level Monitor for s/sx of bleeding Plan for LHC today  Thank you for allowing pharmacy to be a part of this patient's care.  Donnald Garre, PharmD Clinical Pharmacist  Please check AMION for all [place] numbers After 10:00 PM, call [facility] [phone]

## 2021-10-15 NOTE — Progress Notes (Signed)
Progress Note  Patient Name: Leroy Campbell. Date of Encounter: 10/15/2021  [facility] Cardiologist: [name], MD   Subjective   No chest pain while on nitro gtt, anticipating cath today  Inpatient Medications    Scheduled Meds:  allopurinol  200 mg Oral BID   aspirin  81 mg Oral Daily   atorvastatin  80 mg Oral Daily   clopidogrel  75 mg Oral Daily   losartan  25 mg Oral Daily   Continuous Infusions:  heparin 1,300 Units/hr (10/14/21 2103)   nitroGLYCERIN 5 mcg/min (10/13/21 1211)   PRN Meds: acetaminophen, nitroGLYCERIN, ondansetron (ZOFRAN) IV   Vital Signs    Vitals:   10/14/21 1951 10/15/21 0045 10/15/21 0453 10/15/21 0805  BP: 121/73 108/68 103/64 123/71  Pulse: 66 (!) 58  61  Resp: '17 20  16  '$ Temp: 98.2 F (36.8 C) 98.4 F (36.9 C) 98.7 F (37.1 C) 98.1 F (36.7 C)  TempSrc: Oral Oral Oral Oral  SpO2: 99% 100%  94%  Weight:      Height:       No intake or output data in the 24 hours ending 10/15/21 0901     10/12/2021    5:14 PM 10/12/2021   11:05 AM 07/03/2021    9:57 AM  Last 3 Weights  Weight (lbs) 210 lb 5.1 oz 208 lb 213 lb 13.5 oz  Weight (kg) 95.4 kg 94.348 kg 97 kg      Telemetry    SR - Personally Reviewed  ECG    Sinus brady rate 52 - Personally Reviewed  Physical Exam   GEN: No acute distress.   Neck: No JVD Cardiac: RRR, no murmurs, rubs, or gallops.  Respiratory: Clear to auscultation bilaterally. GI: Soft, nontender, non-distended  MS: No edema; No deformity. Neuro:  Nonfocal  Psych: Normal affect   Labs    High Sensitivity Troponin:   Recent Labs  Lab 10/12/21 1109 10/12/21 1314  TROPONINIHS 5 5     Chemistry Recent Labs  Lab 10/13/21 0206 10/14/21 0203 10/15/21 0158  NA 140 139 139  K 3.9 4.1 4.4  CL 104 104 107  CO2 '26 27 26  '$ GLUCOSE 86 126* 100*  BUN '10 9 13  '$ CREATININE 0.94 1.03 1.17  CALCIUM 9.2 9.0 8.8*  PROT 5.7*  --   --   ALBUMIN 3.8  --   --   AST 30  --   --   ALT 27  --    --   ALKPHOS 89  --   --   BILITOT 1.0  --   --   GFRNONAA >60 >60 >60  ANIONGAP '10 8 6    '$ Lipids  Recent Labs  Lab 10/13/21 0206  CHOL 101  TRIG 40  HDL 41  LDLCALC 52  CHOLHDL 2.5    Hematology Recent Labs  Lab 10/13/21 0939 10/14/21 0203 10/15/21 0158  WBC 5.1 6.4 6.7  RBC 4.85 5.03 4.96  HGB 14.5 15.5 15.3  HCT 43.2 44.7 44.5  MCV 89.1 88.9 89.7  MCH 29.9 30.8 30.8  MCHC 33.6 34.7 34.4  RDW 12.9 12.9 12.9  PLT 148* 149* 143*   Thyroid No results for input(s): "TSH", "FREET4" in the last 168 hours.  BNPNo results for input(s): "BNP", "PROBNP" in the last 168 hours.  DDimer No results for input(s): "DDIMER" in the last 168 hours.   Radiology    ECHOCARDIOGRAM COMPLETE  Result Date: 10/13/2021    ECHOCARDIOGRAM  REPORT   Patient Name:   Leroy Campbell. Date of Exam: 10/13/2021 Medical Rec #:  [id number]           Height:       71.0 in Accession #:    [id number]          Weight:       210.3 lb Date of Birth:  1961/04/04           BSA:          2.154 m Patient Age:    61 years            BP:           129/70 mmHg Patient Gender: M                   HR:           62 bpm. Exam Location:  Inpatient Procedure: 2D Echo, Cardiac Doppler and Color Doppler Indications:    R07.9 Chest Pain  History:        Patient has prior history of Echocardiogram examinations, most                 recent 04/28/2021. Ischemic Cardiomyopathy, Previous Myocardial                 Infarction and CAD; Risk Factors:Dyslipidemia.  Sonographer:    Alvino Chapel RCS Referring Phys: [id number] [place]  1. Left ventricular ejection fraction, by estimation, is 55 to 60%. The left ventricle has normal function. The left ventricle has no regional wall motion abnormalities. Left ventricular diastolic parameters are consistent with Grade I diastolic dysfunction (impaired relaxation).  2. Right ventricular systolic function is normal. The right ventricular size is normal.  3. The mitral valve is  normal in structure. No evidence of mitral valve regurgitation. No evidence of mitral stenosis.  4. The aortic valve is tricuspid. Aortic valve regurgitation is not visualized. No aortic stenosis is present.  5. The inferior vena cava is normal in size with greater than 50% respiratory variability, suggesting right atrial pressure of 3 mmHg. FINDINGS  Left Ventricle: Left ventricular ejection fraction, by estimation, is 55 to 60%. The left ventricle has normal function. The left ventricle has no regional wall motion abnormalities. The left ventricular internal cavity size was normal in size. There is  no left ventricular hypertrophy. Left ventricular diastolic parameters are consistent with Grade I diastolic dysfunction (impaired relaxation). Right Ventricle: The right ventricular size is normal. Right ventricular systolic function is normal. Left Atrium: Left atrial size was normal in size. Right Atrium: Right atrial size was normal in size. Pericardium: There is no evidence of pericardial effusion. Mitral Valve: The mitral valve is normal in structure. No evidence of mitral valve regurgitation. No evidence of mitral valve stenosis. Tricuspid Valve: The tricuspid valve is normal in structure. Tricuspid valve regurgitation is trivial. No evidence of tricuspid stenosis. Aortic Valve: The aortic valve is tricuspid. Aortic valve regurgitation is not visualized. No aortic stenosis is present. Pulmonic Valve: The pulmonic valve was normal in structure. Pulmonic valve regurgitation is trivial. No evidence of pulmonic stenosis. Aorta: The aortic root is normal in size and structure. Venous: The inferior vena cava is normal in size with greater than 50% respiratory variability, suggesting right atrial pressure of 3 mmHg. IAS/Shunts: No atrial level shunt detected by color flow Doppler.  LEFT VENTRICLE PLAX 2D LVIDd:         4.70 cm  Diastology LVIDs:         3.20 cm   LV e' medial:    6.99 cm/s LV PW:         1.10 cm   LV  E/e' medial:  13.1 LV IVS:        1.10 cm   LV e' lateral:   11.50 cm/s LVOT diam:     2.10 cm   LV E/e' lateral: 8.0 LV SV:         95 LV SV Index:   44 LVOT Area:     3.46 cm  RIGHT VENTRICLE RV S prime:     13.50 cm/s TAPSE (M-mode): 2.7 cm LEFT ATRIUM           Index        RIGHT ATRIUM           Index LA diam:      3.30 cm 1.53 cm/m   RA Area:     18.10 cm LA Vol (A2C): 49.0 ml 22.75 ml/m  RA Volume:   52.00 ml  24.14 ml/m LA Vol (A4C): 53.1 ml 24.65 ml/m  AORTIC VALVE LVOT Vmax:   128.00 cm/s LVOT Vmean:  77.000 cm/s LVOT VTI:    0.274 m  AORTA Ao Root diam: 3.70 cm MITRAL VALVE MV Area (PHT): 3.12 cm     SHUNTS MV Decel Time: 243 msec     Systemic VTI:  0.27 m MV E velocity: 91.70 cm/s   Systemic Diam: 2.10 cm MV A velocity: 104.00 cm/s MV E/A ratio:  0.88 Kirk Ruths MD Electronically signed by Kirk Ruths MD Signature Date/Time: 10/13/2021/4:01:12 PM    Final     Cardiac Studies   Echo as above, no significant RWMA  Patient Profile   Leroy Campbell. is a 61 y.o. 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  EF at that time was 45%.  He now presents to [place] [facility] with complaints of intermittent chest pain for a week proved with nitrates.  We are now asked to evaluate patient for recurrent chest pain, with plan for repeat LHC.   Assessment & Plan    Chest pain/ASCAD -Patient status post inferior MI 04/24/2021 with PCI of the RCA with nonobstructive disease of 35% in the proximal LAD, 50% D1 and 20% mid to distal left circumflex status post PCI of the RCA -Brilinta was subsequently changed to Plavix due to a rash which resolved -Now presenting with several weeks/months history of intermittent chest pain resolved with sublingual nitro x2 and not recurring on nitro gtt. -Troponin normal and EKG with sinus brady. -He has been compliant with all his cardiac meds including aspirin and  Plavix -Currently pain-free on nitro gtt -Continue home medications including aspirin 81 mg daily, atorvastatin 80 mg daily -We will hold Toprol due to resting bradycardia at 50 bpm currently on telemetry -Continue IV heparin started in the ER -Recommend left heart cath today. INFORMED CONSENT: I have reviewed the risks, indications, and alternatives to cardiac catheterization, possible angioplasty, and stenting with the patient. Risks include but are not limited to bleeding, infection, vascular injury, stroke, myocardial infarction, arrhythmia, kidney injury, radiation-related injury in the case of prolonged fluoroscopy use, emergency cardiac surgery, and death. The patient understands the risks of serious complication is 1-2 in 2633 with diagnostic cardiac cath and 1-2% or less with angioplasty/stenting.    2.  Hyperlipidemia -  LDL goal less than 70 -Continue atorvastatin 80 mg daily - FLP excellent. LDL 52   3.  Ischemic cardiomyopathy, resolved -EF 45 to 50% with inferior hypokinesis on echo 04/2021 -Continue losartan 25 mg daily  -Hold beta-blocker due to bradycardia -2D echo to reassess LV function. - EF normalized. -He does not appear volume overloaded on exam  For questions or updates, please contact [place] Please consult [email or web address] for contact info under        Signed, Elouise Munroe, MD  10/15/2021, 9:01 AM

## 2021-10-15 NOTE — H&P (View-Only) (Signed)
Progress Note  Patient Name: Leroy Campbell. Date of Encounter: 10/15/2021  [facility] Cardiologist: [name], MD   Subjective   No chest pain while on nitro gtt, anticipating cath today  Inpatient Medications    Scheduled Meds:  allopurinol  200 mg Oral BID   aspirin  81 mg Oral Daily   atorvastatin  80 mg Oral Daily   clopidogrel  75 mg Oral Daily   losartan  25 mg Oral Daily   Continuous Infusions:  heparin 1,300 Units/hr (10/14/21 2103)   nitroGLYCERIN 5 mcg/min (10/13/21 1211)   PRN Meds: acetaminophen, nitroGLYCERIN, ondansetron (ZOFRAN) IV   Vital Signs    Vitals:   10/14/21 1951 10/15/21 0045 10/15/21 0453 10/15/21 0805  BP: 121/73 108/68 103/64 123/71  Pulse: 66 (!) 58  61  Resp: '17 20  16  '$ Temp: 98.2 F (36.8 C) 98.4 F (36.9 C) 98.7 F (37.1 C) 98.1 F (36.7 C)  TempSrc: Oral Oral Oral Oral  SpO2: 99% 100%  94%  Weight:      Height:       No intake or output data in the 24 hours ending 10/15/21 0901     10/12/2021    5:14 PM 10/12/2021   11:05 AM 07/03/2021    9:57 AM  Last 3 Weights  Weight (lbs) 210 lb 5.1 oz 208 lb 213 lb 13.5 oz  Weight (kg) 95.4 kg 94.348 kg 97 kg      Telemetry    SR - Personally Reviewed  ECG    Sinus brady rate 52 - Personally Reviewed  Physical Exam   GEN: No acute distress.   Neck: No JVD Cardiac: RRR, no murmurs, rubs, or gallops.  Respiratory: Clear to auscultation bilaterally. GI: Soft, nontender, non-distended  MS: No edema; No deformity. Neuro:  Nonfocal  Psych: Normal affect   Labs    High Sensitivity Troponin:   Recent Labs  Lab 10/12/21 1109 10/12/21 1314  TROPONINIHS 5 5     Chemistry Recent Labs  Lab 10/13/21 0206 10/14/21 0203 10/15/21 0158  NA 140 139 139  K 3.9 4.1 4.4  CL 104 104 107  CO2 '26 27 26  '$ GLUCOSE 86 126* 100*  BUN '10 9 13  '$ CREATININE 0.94 1.03 1.17  CALCIUM 9.2 9.0 8.8*  PROT 5.7*  --   --   ALBUMIN 3.8  --   --   AST 30  --   --   ALT 27  --    --   ALKPHOS 89  --   --   BILITOT 1.0  --   --   GFRNONAA >60 >60 >60  ANIONGAP '10 8 6    '$ Lipids  Recent Labs  Lab 10/13/21 0206  CHOL 101  TRIG 40  HDL 41  LDLCALC 52  CHOLHDL 2.5    Hematology Recent Labs  Lab 10/13/21 0939 10/14/21 0203 10/15/21 0158  WBC 5.1 6.4 6.7  RBC 4.85 5.03 4.96  HGB 14.5 15.5 15.3  HCT 43.2 44.7 44.5  MCV 89.1 88.9 89.7  MCH 29.9 30.8 30.8  MCHC 33.6 34.7 34.4  RDW 12.9 12.9 12.9  PLT 148* 149* 143*   Thyroid No results for input(s): "TSH", "FREET4" in the last 168 hours.  BNPNo results for input(s): "BNP", "PROBNP" in the last 168 hours.  DDimer No results for input(s): "DDIMER" in the last 168 hours.   Radiology    ECHOCARDIOGRAM COMPLETE  Result Date: 10/13/2021    ECHOCARDIOGRAM  REPORT   Patient Name:   Leroy Campbell. Date of Exam: 10/13/2021 Medical Rec #:  [id number]           Height:       71.0 in Accession #:    [id number]          Weight:       210.3 lb Date of Birth:  11-Sep-1960           BSA:          2.154 m Patient Age:    61 years            BP:           129/70 mmHg Patient Gender: M                   HR:           62 bpm. Exam Location:  Inpatient Procedure: 2D Echo, Cardiac Doppler and Color Doppler Indications:    R07.9 Chest Pain  History:        Patient has prior history of Echocardiogram examinations, most                 recent 04/28/2021. Ischemic Cardiomyopathy, Previous Myocardial                 Infarction and CAD; Risk Factors:Dyslipidemia.  Sonographer:    Leroy Campbell RCS Referring Phys: [id number] [place]  1. Left ventricular ejection fraction, by estimation, is 55 to 60%. The left ventricle has normal function. The left ventricle has no regional wall motion abnormalities. Left ventricular diastolic parameters are consistent with Grade I diastolic dysfunction (impaired relaxation).  2. Right ventricular systolic function is normal. The right ventricular size is normal.  3. The mitral valve is  normal in structure. No evidence of mitral valve regurgitation. No evidence of mitral stenosis.  4. The aortic valve is tricuspid. Aortic valve regurgitation is not visualized. No aortic stenosis is present.  5. The inferior vena cava is normal in size with greater than 50% respiratory variability, suggesting right atrial pressure of 3 mmHg. FINDINGS  Left Ventricle: Left ventricular ejection fraction, by estimation, is 55 to 60%. The left ventricle has normal function. The left ventricle has no regional wall motion abnormalities. The left ventricular internal cavity size was normal in size. There is  no left ventricular hypertrophy. Left ventricular diastolic parameters are consistent with Grade I diastolic dysfunction (impaired relaxation). Right Ventricle: The right ventricular size is normal. Right ventricular systolic function is normal. Left Atrium: Left atrial size was normal in size. Right Atrium: Right atrial size was normal in size. Pericardium: There is no evidence of pericardial effusion. Mitral Valve: The mitral valve is normal in structure. No evidence of mitral valve regurgitation. No evidence of mitral valve stenosis. Tricuspid Valve: The tricuspid valve is normal in structure. Tricuspid valve regurgitation is trivial. No evidence of tricuspid stenosis. Aortic Valve: The aortic valve is tricuspid. Aortic valve regurgitation is not visualized. No aortic stenosis is present. Pulmonic Valve: The pulmonic valve was normal in structure. Pulmonic valve regurgitation is trivial. No evidence of pulmonic stenosis. Aorta: The aortic root is normal in size and structure. Venous: The inferior vena cava is normal in size with greater than 50% respiratory variability, suggesting right atrial pressure of 3 mmHg. IAS/Shunts: No atrial level shunt detected by color flow Doppler.  LEFT VENTRICLE PLAX 2D LVIDd:         4.70 cm  Diastology LVIDs:         3.20 cm   LV e' medial:    6.99 cm/s LV PW:         1.10 cm   LV  E/e' medial:  13.1 LV IVS:        1.10 cm   LV e' lateral:   11.50 cm/s LVOT diam:     2.10 cm   LV E/e' lateral: 8.0 LV SV:         95 LV SV Index:   44 LVOT Area:     3.46 cm  RIGHT VENTRICLE RV S prime:     13.50 cm/s TAPSE (M-mode): 2.7 cm LEFT ATRIUM           Index        RIGHT ATRIUM           Index LA diam:      3.30 cm 1.53 cm/m   RA Area:     18.10 cm LA Vol (A2C): 49.0 ml 22.75 ml/m  RA Volume:   52.00 ml  24.14 ml/m LA Vol (A4C): 53.1 ml 24.65 ml/m  AORTIC VALVE LVOT Vmax:   128.00 cm/s LVOT Vmean:  77.000 cm/s LVOT VTI:    0.274 m  AORTA Ao Root diam: 3.70 cm MITRAL VALVE MV Area (PHT): 3.12 cm     SHUNTS MV Decel Time: 243 msec     Systemic VTI:  0.27 m MV E velocity: 91.70 cm/s   Systemic Diam: 2.10 cm MV A velocity: 104.00 cm/s MV E/A ratio:  0.88 Kirk Ruths MD Electronically signed by Kirk Ruths MD Signature Date/Time: 10/13/2021/4:01:12 PM    Final     Cardiac Studies   Echo as above, no significant RWMA  Patient Profile   Leroy Campbell. is a 61 y.o. 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  EF at that time was 45%.  He now presents to [place] [facility] with complaints of intermittent chest pain for a week proved with nitrates.  We are now asked to evaluate patient for recurrent chest pain, with plan for repeat LHC.   Assessment & Plan    Chest pain/ASCAD -Patient status post inferior MI 04/24/2021 with PCI of the RCA with nonobstructive disease of 35% in the proximal LAD, 50% D1 and 20% mid to distal left circumflex status post PCI of the RCA -Brilinta was subsequently changed to Plavix due to a rash which resolved -Now presenting with several weeks/months history of intermittent chest pain resolved with sublingual nitro x2 and not recurring on nitro gtt. -Troponin normal and EKG with sinus brady. -He has been compliant with all his cardiac meds including aspirin and  Plavix -Currently pain-free on nitro gtt -Continue home medications including aspirin 81 mg daily, atorvastatin 80 mg daily -We will hold Toprol due to resting bradycardia at 50 bpm currently on telemetry -Continue IV heparin started in the ER -Recommend left heart cath today. INFORMED CONSENT: I have reviewed the risks, indications, and alternatives to cardiac catheterization, possible angioplasty, and stenting with the patient. Risks include but are not limited to bleeding, infection, vascular injury, stroke, myocardial infarction, arrhythmia, kidney injury, radiation-related injury in the case of prolonged fluoroscopy use, emergency cardiac surgery, and death. The patient understands the risks of serious complication is 1-2 in 2423 with diagnostic cardiac cath and 1-2% or less with angioplasty/stenting.    2.  Hyperlipidemia -  LDL goal less than 70 -Continue atorvastatin 80 mg daily - FLP excellent. LDL 52   3.  Ischemic cardiomyopathy, resolved -EF 45 to 50% with inferior hypokinesis on echo 04/2021 -Continue losartan 25 mg daily  -Hold beta-blocker due to bradycardia -2D echo to reassess LV function. - EF normalized. -He does not appear volume overloaded on exam  For questions or updates, please contact [place] Please consult [email or web address] for contact info under        Signed, Elouise Munroe, MD  10/15/2021, 9:01 AM

## 2021-10-16 ENCOUNTER — Encounter (HOSPITAL_COMMUNITY): Payer: Self-pay | Admitting: Interventional Cardiology

## 2021-11-08 ENCOUNTER — Encounter: Payer: Self-pay | Admitting: Cardiology

## 2021-11-08 NOTE — Progress Notes (Signed)
Cardiology Office Note   Date:  11/09/2021   ID:  Leroy Campbell., DOB June 17, 1960, MRN [id number]  PCP:  Leroy Partridge, PA (Inactive)  Cardiologist:   Leroy Breeding, MD   Chief Complaint  Patient presents with   Coronary Artery Disease      History of Present Illness: Leroy Campbell. is a 61 y.o. male who presents for evaluation of CAD.  He is status post PCI of an occluded RCA.  He had an EF of 45%.  This was in late December.  Since he was last seen by me in the office he was in the emergency room with chest pain.  He was admitted and had a cardiac cath demonstrating a patent stent.  Anatomy is as below.  The patient denies any new symptoms such as chest discomfort, neck or arm discomfort. There has been no new shortness of breath, PND or orthopnea. There have been no reported palpitations, presyncope or syncope.  He has been exercising.  His son is getting married tomorrow.     Past Medical History:  Diagnosis Date   CAD in native artery 10/15/2021   Coronary artery disease    Gout    Hx of myocardial infarction 04/2021  10/15/2021   Hyperlipidemia    Ischemic cardiomyopathy    MI (myocardial infarction) ([place]) 04/27/2021   Sleep apnea     Past Surgical History:  Procedure Laterality Date   CARDIAC CATHETERIZATION     CORONARY STENT INTERVENTION N/A 04/27/2021   Procedure: CORONARY STENT INTERVENTION;  Surgeon: Martinique, Peter M, MD;  Location: [facility];  Service: Cardiovascular;  Laterality: N/A;   KNEE ARTHROSCOPY Bilateral    x2 bilaterally   KNEE SURGERY Bilateral    x's 4-- 2 surgeries on both knees   LEFT AND RIGHT HEART CATHETERIZATION WITH CORONARY ANGIOGRAM N/A 10/19/2013   Procedure: LEFT AND RIGHT HEART CATHETERIZATION WITH CORONARY ANGIOGRAM;  Surgeon: Leroy Grooms, MD;  Location: [facility];  Service: Cardiovascular;  Laterality: N/A;   LEFT HEART CATH AND CORONARY ANGIOGRAPHY N/A 04/27/2021   Procedure: LEFT HEART CATH AND  CORONARY ANGIOGRAPHY;  Surgeon: Martinique, Peter M, MD;  Location: [facility];  Service: Cardiovascular;  Laterality: N/A;   LEFT HEART CATH AND CORONARY ANGIOGRAPHY N/A 10/15/2021   Procedure: LEFT HEART CATH AND CORONARY ANGIOGRAPHY;  Surgeon: Leroy Booze, MD;  Location: [facility];  Service: Cardiovascular;  Laterality: N/A;   SHOULDER SURGERY Right 05/06/2009   TONSILLECTOMY AND ADENOIDECTOMY       Current Outpatient Medications  Medication Sig Dispense Refill   allopurinol (ZYLOPRIM) 100 MG tablet Take 200 mg by mouth 2 (two) times daily.     aspirin 81 MG chewable tablet Chew 1 tablet (81 mg total) by mouth daily.     atorvastatin (LIPITOR) 80 MG tablet Take 1 tablet (80 mg total) by mouth daily. 30 tablet 11   clopidogrel (PLAVIX) 75 MG tablet Take 1 tablet (75 mg total) by mouth daily.     colchicine 0.6 MG tablet Take 0.6 mg by mouth daily as needed (gout).     isosorbide mononitrate (IMDUR) 30 MG 24 hr tablet Take 1 tablet (30 mg total) by mouth daily. 30 tablet 11   losartan (COZAAR) 25 MG tablet Take 1 tablet (25 mg total) by mouth daily. 30 tablet 11   metoprolol succinate (TOPROL-XL) 25 MG 24 hr tablet Take 0.5 tablets (12.5 mg total) by  mouth daily. 16 tablet 11   nitroGLYCERIN (NITROSTAT) 0.4 MG SL tablet Place 1 tablet (0.4 mg total) under the tongue every 5 (five) minutes x 3 doses as needed for chest pain. 25 tablet 12   vitamin B-12 (CYANOCOBALAMIN) 1000 MCG tablet Take 1,000 mcg by mouth 3 (three) times a week.     vitamin C (ASCORBIC ACID) 500 MG tablet Take 500 mg by mouth daily.     No current facility-administered medications for this visit.    Allergies:   Bee venom and Brilinta [ticagrelor]     ROS:  Please see the history of present illness.   Otherwise, review of systems are positive for none.   All other systems are reviewed and negative.    PHYSICAL EXAM: VS:  BP 112/70   Pulse 64   Ht 6' (1.829 Campbell)   Wt 213 lb 9.6 oz (96.9 kg)    SpO2 96%   BMI 28.97 kg/Campbell  , BMI Body mass index is 28.97 kg/Campbell. GENERAL:  Well appearing NECK:  No jugular venous distention, waveform within normal limits, carotid upstroke brisk and symmetric, no bruits, no thyromegaly LUNGS:  Clear to auscultation bilaterally CHEST:  Unremarkable HEART:  PMI not displaced or sustained,S1 and S2 within normal limits, no S3, no S4, no clicks, no rubs, no murmurs ABD:  Flat, positive bowel sounds normal in frequency in pitch, no bruits, no rebound, no guarding, no midline pulsatile mass, no hepatomegaly, no splenomegaly EXT:  2 plus pulses throughout, no edema, no cyanosis no clubbing     CARDIAC CATH:   Diagnostic Dominance: Right    The ekg ordered 10/14/2021 demonstrates normal sinus rhythm, rate 58, right bundle branch block with possible old inferior infarct, no acute ST-T wave changes.   Recent Labs: 05/06/2021: B Natriuretic Peptide 14.5 10/13/2021: ALT 27 10/15/2021: BUN 13; Creatinine, Ser 1.17; Hemoglobin 15.3; Platelets 143; Potassium 4.4; Sodium 139    Lipid Panel    Component Value Date/Time   CHOL 101 10/13/2021 0206   TRIG 40 10/13/2021 0206   HDL 41 10/13/2021 0206   CHOLHDL 2.5 10/13/2021 0206   VLDL 8 10/13/2021 0206   LDLCALC 52 10/13/2021 0206   LDLDIRECT 142.0 05/25/2013 0844      Wt Readings from Last 3 Encounters:  11/09/21 213 lb 9.6 oz (96.9 kg)  10/12/21 210 lb 5.1 oz (95.4 kg)  07/03/21 213 lb 13.5 oz (97 kg)      Other studies Reviewed: Additional studies/ records that were reviewed today include:   None . Review of the above records demonstrates:  Please see elsewhere in the note.     ASSESSMENT AND PLAN:  CAD:  The patient has no new sypmtoms.  No further cardiovascular testing is indicated.  We will continue with aggressive risk reduction and meds as listed.  He can stop his Plavix at the beginning of the year.  ISCHEMIC CARDIOMYOPATHY:    EF is normal on follow up echo.  No change in therapy    DYSLIPIDEMIA: LDL was 53 with an HDL of 41.  No change in therapy.   SLEEP APNEA:    The patient has been treated with CPAP.  He is compliant.  He is benefiting from the use of the machine and feeling better.  I reviewed the CPAP report for this appointment.  Current medicines are reviewed at length with the patient today.  The patient does not have concerns regarding medicines.  The following changes have been made:  None  Labs/ tests ordered today include: None  No orders of the defined types were placed in this encounter.    Disposition:   FU with me in 12 months.    Signed, Leroy Breeding, MD  11/09/2021 9:29 AM    [facility]

## 2021-11-09 ENCOUNTER — Encounter: Payer: Self-pay | Admitting: Cardiology

## 2021-11-09 ENCOUNTER — Ambulatory Visit (INDEPENDENT_AMBULATORY_CARE_PROVIDER_SITE_OTHER): Payer: No Typology Code available for payment source | Admitting: Cardiology

## 2021-11-09 VITALS — BP 112/70 | HR 64 | Ht 72.0 in | Wt 213.6 lb

## 2021-11-09 DIAGNOSIS — I2510 Atherosclerotic heart disease of native coronary artery without angina pectoris: Secondary | ICD-10-CM | POA: Diagnosis not present

## 2021-11-09 DIAGNOSIS — I255 Ischemic cardiomyopathy: Secondary | ICD-10-CM

## 2021-11-09 DIAGNOSIS — E785 Hyperlipidemia, unspecified: Secondary | ICD-10-CM

## 2021-11-09 DIAGNOSIS — G4730 Sleep apnea, unspecified: Secondary | ICD-10-CM | POA: Diagnosis not present

## 2021-11-09 NOTE — Patient Instructions (Signed)
Medication Instructions:   STOP CLOPIDOGREL AFTER THE FIRST OF THE YEAR   *If you need a refill on your cardiac medications before your next appointment, please call your pharmacy*   Follow-Up: At [facility], you and your health needs are our priority.  As part of our continuing mission to provide you with exceptional heart care, we have created designated Provider Care Teams.  These Care Teams include your primary Cardiologist (physician) and Advanced Practice Providers (APPs -  Physician Assistants and Nurse Practitioners) who all work together to provide you with the care you need, when you need it.  We recommend signing up for the patient portal called "MyChart".  Sign up information is provided on this After Visit Summary.  MyChart is used to connect with patients for Virtual Visits (Telemedicine).  Patients are able to view lab/test results, encounter notes, upcoming appointments, etc.  Non-urgent messages can be sent to your provider as well.   To learn more about what you can do with MyChart, go to [email or web address].    Your next appointment:   12 month(s)  The format for your next appointment:   In Person  Provider:   [name], MD       Important Information About Sugar

## 2021-11-20 ENCOUNTER — Encounter: Payer: Self-pay | Admitting: Cardiovascular Disease

## 2021-11-20 ENCOUNTER — Ambulatory Visit (INDEPENDENT_AMBULATORY_CARE_PROVIDER_SITE_OTHER): Payer: No Typology Code available for payment source | Admitting: Cardiovascular Disease

## 2021-11-20 VITALS — BP 112/62 | HR 58 | Ht 72.0 in | Wt 210.6 lb

## 2021-11-20 DIAGNOSIS — G4700 Insomnia, unspecified: Secondary | ICD-10-CM

## 2021-11-20 DIAGNOSIS — E785 Hyperlipidemia, unspecified: Secondary | ICD-10-CM

## 2021-11-20 DIAGNOSIS — Z9861 Coronary angioplasty status: Secondary | ICD-10-CM

## 2021-11-20 DIAGNOSIS — I2510 Atherosclerotic heart disease of native coronary artery without angina pectoris: Secondary | ICD-10-CM | POA: Diagnosis not present

## 2021-11-20 DIAGNOSIS — I255 Ischemic cardiomyopathy: Secondary | ICD-10-CM

## 2021-11-20 DIAGNOSIS — G4733 Obstructive sleep apnea (adult) (pediatric): Secondary | ICD-10-CM | POA: Diagnosis not present

## 2021-11-20 MED ORDER — ESZOPICLONE 2 MG PO TABS: 2.0000 mg | ORAL_TABLET | Freq: Every evening | ORAL | 1 refills | Status: AC | PRN

## 2021-11-20 NOTE — Patient Instructions (Signed)
Medication Instructions:   -Start eszopiclone (lunesta) '2mg'$  as needed before bedtime.  *If you need a refill on your cardiac medications before your next appointment, please call your pharmacy*   Follow-Up: At [facility], you and your health needs are our priority.  As part of our continuing mission to provide you with exceptional heart care, we have created designated Provider Care Teams.  These Care Teams include your primary Cardiologist (physician) and Advanced Practice Providers (APPs -  Physician Assistants and Nurse Practitioners) who all work together to provide you with the care you need, when you need it.  We recommend signing up for the patient portal called "MyChart".  Sign up information is provided on this After Visit Summary.  MyChart is used to connect with patients for Virtual Visits (Telemedicine).  Patients are able to view lab/test results, encounter notes, upcoming appointments, etc.  Non-urgent messages can be sent to your provider as well.   To learn more about what you can do with MyChart, go to [email or web address].    Your next appointment:   12 month(s)  The format for your next appointment:   In Person  Provider:   Shelva Majestic, MD  Other Instructions [facility]

## 2021-11-20 NOTE — Progress Notes (Signed)
Cardiology Office Note    Date:  11/26/2021   ID:  Leroy Anger., DOB 12-30-1960, MRN [id number]  PCP:  [name], PA (Inactive)  Cardiologist:  Shelva Majestic, MD (sleep); Dr. Percival Spanish  New sleep consultation referred through the courtesy of Dr. Percival Spanish following initiation of CPAP therapy.   History of Present Illness:  Leroy Gitto. is a 61 y.o. male who is followed by Dr. Percival Spanish for cardiology care.  Patient has history of CAD and underwent remote PCI of an occluded RCA in December 2022.  Subsequent cardiac catheterization in June 2023 showed a patent RCA stent.  He initially had an ischemic cardiomyopathy with LV function improving on subsequent echo to 55 to 60% on October 13, 2021.  Due to concerns for obstructive sleep apnea he was initially referred for a home sleep study by Dr. Percival Spanish on July 27, 2021.  He was found to have moderate overall sleep apnea with an AHI of 25.0.  It was moderate oxygen desaturation to a nadir of 83%.  He snored for over 200 minutes or 55% during the sleep evaluation.  He ultimately initiated CPAP auto therapy with set up date on Sep 26, 2021 with [employer] as his DME company.  A download was obtained from May 24 through October 25, 2021 which confirms he is meeting compliance standards with usage days at 90% with average use at 6 hours and 33 minutes.  Typically, he goes to bed between 11 and 11:30 PM and wakes up at 5 AM and occasionally at 6 AM.  He has been using a ResMed AirFit N30i standard mask.  His CPAP is set at a pressure range of 7 to 18 cm of water and his 95th percentile pressure is 9.5 with maximum average pressure 10.6.  AHI is excellent at 1.4/h.  Since initiating CPAP therapy, he is unaware of breakthrough snoring.  His sleep is more restorative.  He has noticed reduction in nocturia.  An Epworth Sleepiness Scale score was calculated in the office today and this endorsed at 5 arguing against residual daytime sleepiness  as shown below:  Epworth Sleepiness Scale: Situation   Chance of Dozing/Sleeping (0 = never , 1 = slight chance , 2 = moderate chance , 3 = high chance )   sitting and reading 1   watching TV 1   sitting inactive in a public place 1   being a passenger in a motor vehicle for an hour or more 1   lying down in the afternoon 0   sitting and talking to someone 0   sitting quietly after lunch (no alcohol) 1   while stopped for a few minutes in traffic as the driver 0   Total Score  5     He denies any bruxism, painful restless legs, hypnopompic or hypnagogic hallucinations or cataplectic events.  He admits that he has difficulty with sleep initiation as well as sleep maintenance.  He is currently on losartan 25 mg, isosorbide 30 mg, and metoprolol succinate 12.5 mg daily for hypertension and CAD.  He continues to be on DAPT with aspirin/Plavix.  He is on atorvastatin 80 mg for hyperlipidemia with target LDL less than 70.  He presents for initial sleep consultation.  Past Medical History:  Diagnosis Date   CAD in native artery 10/15/2021   Coronary artery disease    Gout    Hx of myocardial infarction 04/2021  10/15/2021   Hyperlipidemia  Ischemic cardiomyopathy    MI (myocardial infarction) ([place]) 04/27/2021   Sleep apnea     Past Surgical History:  Procedure Laterality Date   CARDIAC CATHETERIZATION     CORONARY STENT INTERVENTION N/A 04/27/2021   Procedure: CORONARY STENT INTERVENTION;  Surgeon: Martinique, Peter M, MD;  Location: [place] [facility];  Service: Cardiovascular;  Laterality: N/A;   KNEE ARTHROSCOPY Bilateral    x2 bilaterally   KNEE SURGERY Bilateral    x's 4-- 2 surgeries on both knees   LEFT AND RIGHT HEART CATHETERIZATION WITH CORONARY ANGIOGRAM N/A 10/19/2013   Procedure: LEFT AND RIGHT HEART CATHETERIZATION WITH CORONARY ANGIOGRAM;  Surgeon: Sinclair Grooms, MD;  Location: [facility] CATH [facility];  Service: Cardiovascular;  Laterality: N/A;   LEFT HEART CATH AND CORONARY  ANGIOGRAPHY N/A 04/27/2021   Procedure: LEFT HEART CATH AND CORONARY ANGIOGRAPHY;  Surgeon: Martinique, Peter M, MD;  Location: [place] [facility];  Service: Cardiovascular;  Laterality: N/A;   LEFT HEART CATH AND CORONARY ANGIOGRAPHY N/A 10/15/2021   Procedure: LEFT HEART CATH AND CORONARY ANGIOGRAPHY;  Surgeon: Jettie Booze, MD;  Location: [place] [facility];  Service: Cardiovascular;  Laterality: N/A;   SHOULDER SURGERY Right 05/06/2009   TONSILLECTOMY AND ADENOIDECTOMY      Current Medications: Outpatient Medications Prior to Visit  Medication Sig Dispense Refill   allopurinol (ZYLOPRIM) 100 MG tablet Take 200 mg by mouth 2 (two) times daily.     aspirin 81 MG chewable tablet Chew 1 tablet (81 mg total) by mouth daily.     atorvastatin (LIPITOR) 80 MG tablet Take 1 tablet (80 mg total) by mouth daily. 30 tablet 11   clopidogrel (PLAVIX) 75 MG tablet Take 1 tablet (75 mg total) by mouth daily.     colchicine 0.6 MG tablet Take 0.6 mg by mouth daily as needed (gout).     isosorbide mononitrate (IMDUR) 30 MG 24 hr tablet Take 1 tablet (30 mg total) by mouth daily. 30 tablet 11   losartan (COZAAR) 25 MG tablet Take 1 tablet (25 mg total) by mouth daily. 30 tablet 11   metoprolol succinate (TOPROL-XL) 25 MG 24 hr tablet Take 0.5 tablets (12.5 mg total) by mouth daily. 16 tablet 11   nitroGLYCERIN (NITROSTAT) 0.4 MG SL tablet Place 1 tablet (0.4 mg total) under the tongue every 5 (five) minutes x 3 doses as needed for chest pain. 25 tablet 12   vitamin B-12 (CYANOCOBALAMIN) 1000 MCG tablet Take 1,000 mcg by mouth 3 (three) times a week.     vitamin C (ASCORBIC ACID) 500 MG tablet Take 500 mg by mouth daily.     No facility-administered medications prior to visit.     Allergies:   Bee venom and Brilinta [ticagrelor]   Social History   Socioeconomic History   Marital status: Legally Separated    Spouse name: Not on file   Number of children: 2   Years of education: 16   Highest  education level: Not on file  Occupational History   Not on file  Tobacco Use   Smoking status: Never   Smokeless tobacco: Never  Substance and Sexual Activity   Alcohol use: Yes    Comment: Rarely   Drug use: No   Sexual activity: Yes    Partners: Female  Other Topics Concern   Not on file  Social History Narrative   Exercise-- bike, coaching basketball,  softball   Social Determinants of Health   Financial Resource Strain: Not  on file  Food Insecurity: Not on file  Transportation Needs: Not on file  Physical Activity: Not on file  Stress: Not on file  Social Connections: Not on file    Social history is notable that 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  He now works as an Medical illustrator.  He is currently septic rated.  He has 2 children ages 57 and 42.  Family History:  The patient's family history includes Diabetes in his paternal grandmother; Heart disease in his maternal grandfather and mother.  His father has undergone CABG revascularization.  He most likely has obstructive sleep apnea but not definitively evaluated.  Mother is alive and has a stent.  ROS General: Negative; No fevers, chills, or night sweats;  HEENT: Negative; No changes in vision or hearing, sinus congestion, difficulty swallowing Pulmonary: Negative; No cough, wheezing, shortness of breath, hemoptysis Cardiovascular: See HPI; no chest pain, presyncope, syncope, palpitations GI: Negative; No nausea, vomiting, diarrhea, or abdominal pain GU: Negative; No dysuria, hematuria, or difficulty voiding Musculoskeletal: Negative; no myalgias, joint pain, or weakness Hematologic/Oncology: Negative; no easy bruising, bleeding Endocrine: Negative; no heat/cold intolerance; no diabetes Neuro: Negative; no changes in balance, headaches Skin: Negative; No rashes or skin lesions Psychiatric: Negative; No behavioral problems, depression Sleep: See HPI Other comprehensive 14  point system review is negative.   PHYSICAL EXAM:   VS:  BP 112/62   Pulse (!) 58   Ht 6' (1.829 m)   Wt 210 lb 9.6 oz (95.5 kg)   SpO2 96%   BMI 28.56 kg/m     Repeat blood pressure by me was 108/66  Wt Readings from Last 3 Encounters:  11/20/21 210 lb 9.6 oz (95.5 kg)  11/09/21 213 lb 9.6 oz (96.9 kg)  10/12/21 210 lb 5.1 oz (95.4 kg)    General: Alert, oriented, no distress.  Skin: normal turgor, no rashes, warm and dry HEENT: Normocephalic, atraumatic. Pupils equal round and reactive to light; sclera anicteric; extraocular muscles intact;  Nose without nasal septal hypertrophy Mouth/Parynx benign; Mallinpatti scale 3 Neck: No JVD, no carotid bruits; normal carotid upstroke Lungs: clear to ausculatation and percussion; no wheezing or rales Chest wall: without tenderness to palpitation Heart: PMI not displaced, RRR, s1 s2 normal, 1/6 systolic murmur, no diastolic murmur, no rubs, gallops, thrills, or heaves Abdomen: soft, nontender; no hepatosplenomehaly, BS+; abdominal aorta nontender and not dilated by palpation. Back: no CVA tenderness Pulses 2+ Musculoskeletal: full range of motion, normal strength, no joint deformities Extremities: no clubbing cyanosis or edema, Homan's sign negative  Neurologic: grossly nonfocal; Cranial nerves grossly wnl Psychologic: Normal mood and affect   Studies/Labs Reviewed:   EKG:  EKG is not ordered today.  I personally reviewed his most recent ECG of October 14, 2021 which showed sinus bradycardia at 58 bpm.  Recent Labs:    Latest Ref Rng & Units 10/15/2021    1:58 AM 10/14/2021    2:03 AM 10/13/2021    2:06 AM  BMP  Glucose 70 - 99 mg/dL 100  126  86   BUN 6 - 20 mg/dL _0 Creatinine 0.61 - 1.24 mg/dL 1.17  1.03  0.94   Sodium 135 - 145 mmol/L 139  139  140   Potassium 3.5 - 5.1 mmol/L 4.4  4.1  3.9   Chloride 98 - 111 mmol/L 107  104  104   CO2 22 - 32 mmol/L 26  27  26   Calcium 8.9 - 10.3 mg/dL 8.8  9.0  9.2          Latest Ref Rng & Units 10/13/2021    2:06 AM 11/17/2015   10:49 AM 07/19/2014   11:47 AM  Hepatic Function  Total Protein 6.5 - 8.1 g/dL 5.7  6.6  7.2   Albumin 3.5 - 5.0 g/dL 3.8  4.4  4.9   AST 15 - 41 U/L 30  24  33   ALT 0 - 44 U/L _0 Alk Phosphatase 38 - 126 U/L 89  97  109   Total Bilirubin 0.3 - 1.2 mg/dL 1.0  0.7  0.7   Bilirubin, Direct 0.0 - 0.3 mg/dL   0.1        Latest Ref Rng & Units 10/15/2021    1:58 AM 10/14/2021    2:03 AM 10/13/2021    9:39 AM  CBC  WBC 4.0 - 10.5 K/uL 6.7  6.4  5.1   Hemoglobin 13.0 - 17.0 g/dL 15.3  15.5  14.5   Hematocrit 39.0 - 52.0 % 44.5  44.7  43.2   Platelets 150 - 400 K/uL 143  149  148    Lab Results  Component Value Date   MCV 89.7 10/15/2021   MCV 88.9 10/14/2021   MCV 89.1 10/13/2021   Lab Results  Component Value Date   TSH 1.92 11/17/2015   Lab Results  Component Value Date   HGBA1C 5.4 04/27/2021     BNP    Component Value Date/Time   BNP 14.5 05/06/2021 1232    ProBNP No results found for: "PROBNP"   Lipid Panel     Component Value Date/Time   CHOL 101 10/13/2021 0206   TRIG 40 10/13/2021 0206   HDL 41 10/13/2021 0206   CHOLHDL 2.5 10/13/2021 0206   VLDL 8 10/13/2021 0206   LDLCALC 52 10/13/2021 0206   LDLDIRECT 142.0 05/25/2013 0844     RADIOLOGY: No results found.   Additional studies/ records that were reviewed today include:     Patient Name: Leroy Campbell, Leroy Campbell Date: 07/28/2021 Gender: Male D.O.B: 06-22-1960 Age (years): 61 Referring Provider: [name] Height (inches): 30 Interpreting Physician: Shelva Majestic MD, ABSM Weight (lbs): 208 RPSGT: Jacolyn Reedy BMI: 28 MRN: [id number] Neck Size: 17.00   CLINICAL INFORMATION Sleep Study Type: HST   Indication for sleep study: snoring, fatigue   Epworth Sleepiness Score: elevated per referring provider   SLEEP STUDY TECHNIQUE A multi-channel overnight portable sleep study was performed. The channels recorded  were: nasal airflow, thoracic respiratory movement, and oxygen saturation with a pulse oximetry. Snoring was also monitored.   MEDICATIONS allopurinol (ZYLOPRIM) 100 MG tablet aspirin 81 MG chewable tablet atorvastatin (LIPITOR) 80 MG tablet clopidogrel (PLAVIX) 75 MG tablet colchicine 0.6 MG tablet losartan (COZAAR) 25 MG tablet metoprolol succinate (TOPROL-XL) 25 MG 24 hr tablet naproxen sodium (ALEVE) 220 MG tablet nitroGLYCERIN (NITROSTAT) 0.4 MG SL tablet vitamin B-12 (CYANOCOBALAMIN) 1000 MCG tablet vitamin C (ASCORBIC ACID) 500 MG tablet Patient self administered medications include: N/A.   SLEEP ARCHITECTURE Patient was studied for 364.5 minutes. The sleep efficiency was 100.0 % and the patient was supine for 24.2%. The arousal index was 0.0 per hour.   RESPIRATORY PARAMETERS The overall AHI was 25.0 per hour, with a central apnea index of 0 per hour.   The oxygen nadir was 83% during sleep.   CARDIAC DATA Mean heart rate during sleep was  56.0 bpm. The lowest HR was 46 and fastest 92 bpm.   IMPRESSIONS - Moderate obstructive sleep apnea occurred during this study (AHI 25.0/h). - Moderate oxygen desaturation to a nadir of 83%. - Patient snored for 200.3 minutes (55%) during the sleep.   DIAGNOSIS - Obstructive Sleep Apnea (G47.33) - Nocturnal Hypoxemia (G47.36)   RECOMMENDATIONS - In this patient with significant cardiovascular co-morbidities recommend therapeutic CPAP for treatment of his sleep disordered breathing. If unable to obtain an in-lab titration, initiate Auto-PAP with EPR of 3 at 7 - 18 cm of water. - Effort should be made to optimize nasal and oropharyngeal patency. - Avoid alcohol, sedatives and other CNS depressants that may worsen sleep apnea and disrupt normal sleep architecture. - Sleep hygiene should be reviewed to assess factors that may improve sleep quality. - Weight management and regular exercise should be initiated or continued. - Recommend a  download and sleep clinic after one month of therapy.     ASSESSMENT:    1. OSA (obstructive sleep apnea)   2. Difficulty with sleep initiation and maintenance   3. Coronary artery disease involving native coronary artery of native heart without angina pectoris   4. History of percutaneous coronary intervention: DES stent to RCA 04/2021   5. Ischemic cardiomyopathy   6. Dyslipidemia     PLAN:  Mr. Tytan Sandate is a very pleasant 61 year old gentleman who has cardiovascular comorbidities including CAD, and is status post remote stenting to a totally occluded RCA.  He has a history of an initial ischemic cardiomyopathy with subsequent normalization of LV function following revascularization and medical therapy and is on lipid-lowering therapy for hyperlipidemia.  Due to concerns for obstructive sleep apnea he was referred for he was referred by Dr. Percival Spanish for a home sleep study on July 28, 2021.  He was found to have at least moderate overall sleep apnea with an AHI of 25.0 and had oxygen desaturation to a nadir of 83%.  He snored for 55% of his sleep duration.  An in lab titration was not performed and ultimately he initiated CPAP auto therapy with an initial pressure range of 7 to 18 cm of water and EPR of 3.  His CPAP set up date was Sep 26, 2021.  His initial download from May 24 through October 25, 2021 confirms compliance with usage days at 90% and usage greater than 4 hours at 87%.  Sleep duration however is suboptimal at only 6 hours and 33 minutes.  Typically he goes to bed between 11 and 11:30 PM and often wakes up at 5 AM and rarely can sleep until 6.  He has had issues with sleep initiation as well as sleep maintenance.  His download confirms excellent benefit with an AHI of 1.4/h and his 95th percentile pressure is 9.5 with a maximum average pressure of 10.6.  Presently, he is unaware of breakthrough snoring.  His Epworth Sleepiness Scale score endorsed at 5 arguing against residual daytime  sleepiness.  However I discussed with him the importance of optimal sleep duration ideally at 7 to 9 hours per night.  I discussed normal sleep architecture with a normal 20 mm diastolic dip with blood pressure and reduced heart rate with sleep contributed by the parasympathetic response of NREM sleep.  I discussed arousals contribute to increased sympathetic tone leading to increased heart rate and not allowing the reduction in blood pressure.  I discussed its effects on hypertension, potential for nocturnal arrhythmias and particularly increased risk for atrial  fibrillation if sleep apnea is untreated.  In addition I discussed its negative effects on insulin resistance, increased inflammatory markers contributing to increased inflammation and increased GERD.  With his underlying coronary artery disease, I also discussed potential nocturnal hypoxemia contributing to nocturnal ischemia both cardiac as well as cerebrovascular.  I also discussed the pathophysiology associated with sleep apnea contributing to increased nocturia.  Presently, he has had issues with both sleep initiation and sleep maintenance.  I will give him a trial of low-dose generic Lunesta to 2 mg to see if this improves his sleep difficulty.  I have given him 1 refill but have requested that he follow this up with his primary care physician.  He will contact me if issues arise.  Otherwise as long as he remains stable I will see him in 1 year for reevaluation.   Time spent: 40 minutes Medication Adjustments/Labs and Tests Ordered: Current medicines are reviewed at length with the patient today.  Concerns regarding medicines are outlined above.  Medication changes, Labs and Tests ordered today are listed in the Patient Instructions below. Patient Instructions  Medication Instructions:   -Start eszopiclone (lunesta) 67m as needed before bedtime.  *If you need a refill on your cardiac medications before your next appointment, please call  your pharmacy*   Follow-Up: [facility] and your [facility] needs are our priority.  As part of our continuing mission to provide you with exceptional heart care, we have created designated Provider Care Teams.  These Care Teams include your primary Cardiologist (physician) and Advanced Practice Providers (APPs -  Physician Assistants and Nurse Practitioners) who all work together to provide you with the care you need, when you need it.  We recommend signing up for the patient portal called "MyChart".  Sign up information is provided on this After Visit Summary.  MyChart is used to connect with patients for Virtual Visits (Telemedicine).  Patients are able to view lab/test results, encounter notes, upcoming appointments, etc.  Non-urgent messages can be sent to your provider as well.   To learn more about what you can do with MyChart, go to [email or web address]    Your next appointment:   12 month(s)  The format for your next appointment:   In Person  Provider:   TShelva Majestic MD  Other Instructions Sleep Clinic   Signed, TShelva Majestic MD, [facility] AR[facility]ublic American Board of Sleep Medicine  11/26/2021 10:47 AM    [address] [place]  [id number]: [phone]

## 2021-11-26 ENCOUNTER — Encounter: Payer: Self-pay | Admitting: Cardiovascular Disease

## 2021-12-04 ENCOUNTER — Ambulatory Visit: Payer: No Typology Code available for payment source | Admitting: Physician Assistant

## 2022-04-25 ENCOUNTER — Encounter: Payer: Self-pay | Admitting: Cardiology

## 2022-04-25 NOTE — Telephone Encounter (Signed)
Error

## 2022-06-20 ENCOUNTER — Other Ambulatory Visit: Payer: Self-pay | Admitting: Cardiology

## 2022-07-29 ENCOUNTER — Encounter (HOSPITAL_BASED_OUTPATIENT_CLINIC_OR_DEPARTMENT_OTHER): Payer: Self-pay | Admitting: Emergency Medicine

## 2022-07-29 ENCOUNTER — Emergency Department (HOSPITAL_BASED_OUTPATIENT_CLINIC_OR_DEPARTMENT_OTHER): Payer: BLUE CROSS/BLUE SHIELD

## 2022-07-29 ENCOUNTER — Emergency Department (HOSPITAL_BASED_OUTPATIENT_CLINIC_OR_DEPARTMENT_OTHER)
Admission: EM | Admit: 2022-07-29 | Discharge: 2022-07-29 | Disposition: A | Discharge status: Home / Self Care | Payer: BLUE CROSS/BLUE SHIELD | Attending: Emergency Medicine | Admitting: Emergency Medicine

## 2022-07-29 ENCOUNTER — Other Ambulatory Visit: Payer: Self-pay

## 2022-07-29 DIAGNOSIS — I2510 Atherosclerotic heart disease of native coronary artery without angina pectoris: Secondary | ICD-10-CM | POA: Insufficient documentation

## 2022-07-29 DIAGNOSIS — R110 Nausea: Secondary | ICD-10-CM | POA: Insufficient documentation

## 2022-07-29 DIAGNOSIS — Z79899 Other long term (current) drug therapy: Secondary | ICD-10-CM | POA: Diagnosis not present

## 2022-07-29 DIAGNOSIS — I249 Acute ischemic heart disease, unspecified: Secondary | ICD-10-CM

## 2022-07-29 DIAGNOSIS — R0789 Other chest pain: Secondary | ICD-10-CM | POA: Diagnosis present

## 2022-07-29 DIAGNOSIS — Z7902 Long term (current) use of antithrombotics/antiplatelets: Secondary | ICD-10-CM | POA: Insufficient documentation

## 2022-07-29 DIAGNOSIS — Z7982 Long term (current) use of aspirin: Secondary | ICD-10-CM | POA: Diagnosis not present

## 2022-07-29 DIAGNOSIS — I10 Essential (primary) hypertension: Secondary | ICD-10-CM | POA: Insufficient documentation

## 2022-07-29 DIAGNOSIS — R001 Bradycardia, unspecified: Secondary | ICD-10-CM | POA: Diagnosis not present

## 2022-07-29 DIAGNOSIS — I200 Unstable angina: Secondary | ICD-10-CM | POA: Diagnosis present

## 2022-07-29 LAB — BASIC METABOLIC PANEL
Anion gap: 4 — ABNORMAL LOW (ref 5–15)
BUN: 13 mg/dL (ref 6–20)
CO2: 32 mmol/L (ref 22–32)
Calcium: 9.6 mg/dL (ref 8.9–10.3)
Chloride: 103 mmol/L (ref 98–111)
Creatinine, Ser: 0.99 mg/dL (ref 0.61–1.24)
GFR, Estimated: >60 mL/min (ref >60)
Glucose, Bld: 95 mg/dL (ref 70–99)
Potassium: 4.2 mmol/L (ref 3.5–5.1)
Sodium: 139 mmol/L (ref 135–145)

## 2022-07-29 LAB — CBC
HCT: 43.3 % (ref 39.0–52.0)
Hemoglobin: 15.1 g/dL (ref 13.0–17.0)
MCH: 30.1 pg (ref 26.0–34.0)
MCHC: 34.9 g/dL (ref 30.0–36.0)
MCV: 86.3 fL (ref 80.0–100.0)
Platelets: 151 10*3/uL (ref 150–400)
RBC: 5.02 MIL/uL (ref 4.22–5.81)
RDW: 12.9 % (ref 11.5–15.5)
WBC: 5.3 10*3/uL (ref 4.0–10.5)
nRBC: 0 % (ref 0.0–0.2)

## 2022-07-29 LAB — TROPONIN I (HIGH SENSITIVITY)
Troponin I (High Sensitivity): 5 ng/L (ref <18)
Troponin I (High Sensitivity): 5 ng/L (ref <18)

## 2022-07-29 MED ORDER — NITROGLYCERIN 0.4 MG SL SUBL: 0.4000 mg | SUBLINGUAL_TABLET | SUBLINGUAL | Status: DC | PRN

## 2022-07-29 NOTE — ED Notes (Signed)
Patient states his insurance Nurse, mental health) will not pay for the admission because he is out of network; so he is requesting to be discharged.  Called s/w Cathy at patient placement to advise she can release the bed--Izora Benn

## 2022-07-29 NOTE — ED Triage Notes (Signed)
Pt reports CP since Saturday that worsened last night and today. Pain to both sides of chest, worse on left side, radiation to left arm and neck. Hx of stent.

## 2022-07-29 NOTE — Discharge Instructions (Signed)
1.  Follow-up with your cardiologist tomorrow. 2.  If you get recurrence of chest pain, go to the emergency department immediately.

## 2022-07-29 NOTE — ED Provider Notes (Addendum)
I provided a substantive portion of the care of this patient.  I personally made/approved the management plan for this patient and take responsibility for the patient management.  EKG Interpretation  Date/Time:  Monday July 29 2022 15:28:42 EDT Ventricular Rate:  52 PR Interval:  156 QRS Duration: 103 QT Interval:  436 QTC Calculation: 406 R Axis:   55 Text Interpretation: Sinus rhythm Abnormal R-wave progression, early transition inferior ST appears slightly elevated. old tracing has subtle similar elevation. will repeat. Confirmed by Charlesetta Shanks [phone]) on 07/29/2022 5:49:22 PM   Reports has been having waxing and waning chest pain over the weekend.  He did relief of pain with nitroglycerin.  He also has been feeling generally weak and low energy.  Patient reports he has been measuring blood pressures over the weekend and they have been unusually low sometimes in the 90s.  He has not had fever chills vomiting or diarrhea.  Patient is alert nontoxic clinically well in appearance.  No respiratory distress.  Lungs are clear.  Heart regular no rub or gallop.  Abdomen soft without guarding.  No peripheral edema or calf tenderness.  EKG reviewed by myself is similar to previous but does have some subtle inferior ST elevation.  First troponin is normal.  Patient does have ACS risk factors.  Patient describes pain is radiating to shoulders and central back.  PA-C's Lia Hopping has consulted cardiology.  The recommendation is for admission.   Charlesetta Shanks, MD 07/29/22 1857  Patient is have had extensive conversation with patient's insurer [facility], they reported they would not pay for inpatient treatment at [facility] facility due to being out of network.  Patient's network is [facility].  21: 00 I have consulted cardiology Dr. Doyne Keel at [facility].  The except for admission however advised there is a wait list with an estimate 12 to 24 hours before possible transfer.   Charlesetta Shanks, MD 07/29/22 2056 10 31 reports that he does not want to wait in the emergency department anymore.  Time to [facility] is uncertain between 12 to 24-hour estimate for admission.   Charlesetta Shanks, MD 08/05/22 [phone]

## 2022-07-29 NOTE — ED Notes (Signed)
Patient admission/transportation delayed due to insurance issue. Patient's daughter on the phone with insurance at this time.

## 2022-07-29 NOTE — ED Provider Notes (Signed)
[facility] Provider Note   CSN: [id number] Arrival date & time: 07/29/22  1523     History  Chief Complaint  Patient presents with   Chest Pain    Leroy Campbell. is a 62 y.o. male.   Chest Pain    Patient with medical history of hypertension, ischemic cardiomyopathy, CAD, history of MI status post stent presents to the emergency department due to chest pain.  Started 4 days ago, described as a tightness/pressure.  That is constant although the severity waxes and wanes throughout the day.  He is also having a sharp pain which comes intermittently, unable evaluate and apply provoking features.  When it occurs is to the right shoulder, goes down his left arm.  Also radiates up to his jaw.  Some nausea, no vomiting.  Took nitro last night which helped but did not fully resolve it.  Seen by primary earlier today, given 324 of aspirin, sent to ED for further evaluation.  Home Medications Prior to Admission medications   Medication Sig Start Date End Date Taking? Authorizing Provider  traZODone (DESYREL) 50 MG tablet Indications: insomnia associated with depression 06/03/22  Yes [provider]  allopurinol (ZYLOPRIM) 100 MG tablet Take 200 mg by mouth 2 (two) times daily. 04/09/21   [provider]  aspirin 81 MG chewable tablet Chew 1 tablet (81 mg total) by mouth daily. 04/30/21   Barrett, Evelene Croon, PA-C  atorvastatin (LIPITOR) 80 MG tablet Take 1 tablet by mouth once daily 06/21/22   [name], MD  clopidogrel (PLAVIX) 75 MG tablet Take 1 tablet (75 mg total) by mouth daily. 10/16/21   Isaiah Serge, NP  colchicine 0.6 MG tablet Take 0.6 mg by mouth daily as needed (gout). 05/02/21   [provider]  eszopiclone (LUNESTA) 2 MG TABS tablet Take 1 tablet (2 mg total) by mouth at bedtime as needed for sleep. Take immediately before bedtime 11/20/21   Troy Sine, MD  isosorbide mononitrate (IMDUR) 30 MG 24  hr tablet Take 1 tablet (30 mg total) by mouth daily. 10/15/21 10/15/22  Isaiah Serge, NP  losartan (COZAAR) 25 MG tablet Take 1 tablet by mouth once daily 06/21/22   [name], MD  metoprolol succinate (TOPROL-XL) 25 MG 24 hr tablet Take 0.5 tablets (12.5 mg total) by mouth daily. 10/15/21   Isaiah Serge, NP  nitroGLYCERIN (NITROSTAT) 0.4 MG SL tablet Place 1 tablet (0.4 mg total) under the tongue every 5 (five) minutes x 3 doses as needed for chest pain. 04/29/21   Barrett, Evelene Croon, PA-C  vitamin B-12 (CYANOCOBALAMIN) 1000 MCG tablet Take 1,000 mcg by mouth 3 (three) times a week.    [provider]  vitamin C (ASCORBIC ACID) 500 MG tablet Take 500 mg by mouth daily.    [provider]      Allergies    Bee venom and Brilinta [ticagrelor]    Review of Systems   Review of Systems  Cardiovascular:  Positive for chest pain.    Physical Exam Updated Vital Signs BP (!) 171/83 (BP Location: Right Arm)   Pulse (!) 50   Temp 98.1 F (36.7 C) (Oral)   Resp 14   Ht 5' 11.5" (1.816 m)   Wt 96.2 kg   SpO2 100%   BMI 29.16 kg/m  Physical Exam Vitals and nursing note reviewed. Exam conducted with a chaperone present.  Constitutional:      Appearance: Normal  appearance.  HENT:     Head: Normocephalic and atraumatic.  Eyes:     General: No scleral icterus.       Right eye: No discharge.        Left eye: No discharge.     Extraocular Movements: Extraocular movements intact.     Pupils: Pupils are equal, round, and reactive to light.  Cardiovascular:     Rate and Rhythm: Regular rhythm. Bradycardia present.     Pulses: Normal pulses.     Heart sounds: Normal heart sounds.     No friction rub. No gallop.  Pulmonary:     Effort: Pulmonary effort is normal. No respiratory distress.     Breath sounds: Normal breath sounds.  Abdominal:     General: Abdomen is flat. Bowel sounds are normal. There is no distension.     Palpations: Abdomen is soft.      Tenderness: There is no abdominal tenderness.  Skin:    General: Skin is warm and dry.     Coloration: Skin is not jaundiced.  Neurological:     Mental Status: He is alert. Mental status is at baseline.     Coordination: Coordination normal.     ED Results / Procedures / Treatments   Labs (all labs ordered are listed, but only abnormal results are displayed) Labs Reviewed  BASIC METABOLIC PANEL - Abnormal; Notable for the following components:      Result Value   Anion gap 4 (*)    All other components within normal limits  CBC  TROPONIN I (HIGH SENSITIVITY)  TROPONIN I (HIGH SENSITIVITY)    EKG EKG Interpretation  Date/Time:  Monday July 29 2022 15:28:42 EDT Ventricular Rate:  52 PR Interval:  156 QRS Duration: 103 QT Interval:  436 QTC Calculation: 406 R Axis:   55 Text Interpretation: Sinus rhythm Abnormal R-wave progression, early transition inferior ST appears slightly elevated. old tracing has subtle similar elevation. will repeat. Confirmed by Charlesetta Shanks [phone]) on 07/29/2022 5:49:22 PM  Radiology DG Chest Port 1 View  Result Date: 07/29/2022 CLINICAL DATA:  Chest pain EXAM: PORTABLE CHEST 1 VIEW COMPARISON:  10/12/2021 FINDINGS: Artifact from EKG leads. Normal heart size and mediastinal contours. Coronary stenting. No acute infiltrate or edema. No effusion or pneumothorax. No acute osseous findings. IMPRESSION: No evidence of active disease. Electronically Signed   By: Jorje Guild M.D.   On: 07/29/2022 15:48    Procedures Procedures    Medications Ordered in ED Medications  nitroGLYCERIN (NITROSTAT) SL tablet 0.4 mg (has no administration in time range)    ED Course/ Medical Decision Making/ A&P Clinical Course as of 07/29/22 1836  Mon Jul 29, 2022  1824 I consulted cardiology and spoke with Dr. Norman Herrlich, patient is high risk for ACS.  Even though negative delta troponin cardiology would recommend admission, they will admit to their service.   Appreciate his consult. [HS]    Clinical Course User Index [HS] Sherrill Raring, PA-C                             Medical Decision Making Amount and/or Complexity of Data Reviewed Labs: ordered. Radiology: ordered.  Risk Prescription drug management. Decision regarding hospitalization.   Patient presents to the emergency department due to chest pain.  Differential includes ACS, PE, pneumonia, dissection, proximal esophagus, unstable angina.  Patient is has daughter and mother at side providing history.  Also viewed external medical records, patient  had cath December of last year.  CBC without leukocytosis or anemia, CMP without gross electrolyte derangement or AKI.  Negative delta troponin.  Chest x-ray is unremarkable.  EKG shows sinus bradycardia without any specific ischemic changes.  Repeat EKG was similar.  I consulted cardiology as documented ED course, spoke with Dr. Percival Spanish who recommends admission to the cardiology service.  Appreciate his consult.  Patient is agreement with this plan.  I do not suspect PE, dissection based on evaluation today.  Think unstable angina versus ACS.        Final Clinical Impression(s) / ED Diagnoses Final diagnoses:  ACS (acute coronary syndrome) [facility])    Rx / DC Orders ED Discharge Orders     None         Sherrill Raring, PA-C 07/29/22 1836    Charlesetta Shanks, MD 08/05/22 [phone]

## 2022-07-29 NOTE — ED Notes (Signed)
Per MD patient may eat, provided pt with water and crackers.

## 2022-07-29 NOTE — ED Notes (Signed)
Reviewed AVS with patient, patient expressed understanding of directions, denies further questions at this time. 

## 2022-07-30 ENCOUNTER — Telehealth: Payer: Self-pay | Admitting: Cardiology

## 2022-07-30 NOTE — Telephone Encounter (Signed)
Patient wants a call back from Dr. Percival Spanish or his nurse regarding advice from yesterday's ED visit.

## 2022-07-30 NOTE — Telephone Encounter (Signed)
Called pt and he does verify that his insurance does not cover coming here to [facility]. He will have to go to [facility].  His BP now is 105/65 HR 61. He states that he is having no chest pain since he took a Nitro Sunday at [place]. He is taking his losartan and all other medications on his list. He will call [facility] and see if he is still on the waiting list to be admitted and discuss admitting with them. He will go to the ER if chest pain comes back.I did schedule him an appt for 4-4. He will call and  cancel at least 24 hours before.  He will call/message if anything else is needed.

## 2022-08-08 ENCOUNTER — Ambulatory Visit: Payer: BLUE CROSS/BLUE SHIELD | Admitting: Cardiology

## 2022-09-27 NOTE — Progress Notes (Signed)
 Cardiology Clinic Note  [facility] HEART & VASCULAR - [address] [place] [id number] Dept: [phone]  REQUESTING PHYSICIAN: No ref. provider found  REASON FOR CONSULTATION:  Chief Complaint  Patient presents with   hospital follow up    Portion of this note were dictated using DRAGON voices recognition software. Please disregard any errors in transcription .  ASSESSMENT AND PLAN: CAD with Hx of MI with RCA in December 2022 GERD -LHC with patent RCA stent and non-obstructive disease elsewhere.  -Symptoms have improved since starting on Pantoprazole , likely acid reflux -c/w aspirin  81mg  daily, lipitor  80mg  nightly, imdur  30mg  daily. Torpol 25mg  daily. -Losartan  has been stopped by PCP. -RTC in 6 months. -The above plan was discussed with the patient in detail. He expresses understanding and agrees with the plan.  Thank you for the opportunity of assisting in the care of Leroy Campbell.  Please do not hesitate to call if you have any questions.  Sincerely,   Lennart RAMAN. Debora, MD Cardiology  [facility] 09/27/2022  3:10 PM   HISTORY AND PRESENT ILLNESS:    Leroy Campbell is a very pleasant 62 year old male with PMHx of CAD status post RCA stent December 2022, previously follows with Dr. Lynwood Schilling at [facility]. Currently following with me here at [facility]. Last visit in March he complained of chest pains and I had sent him for cardiac cath which showed patent stent and non-obstructive disease elsewhere. Since then, he has started on Pantoprazole  with improvement of symptoms.   CORONARY RISK FACTORS:  Prior CAD  Prior Cardiac Workup: EKG 08/02/2022: NSR, old inferior MI. Left hear cath 08/08/2022: 1.  Mild, nonobstructive coronary artery disease. 2.  Widely patent stent in the mid RCA. 3.  Normal LV systolic function with EF about 65%.  Normal LV regional wall motion  Left heart cath 10/15/2021 [facility]):   Prox LAD  lesion is 35% stenosed.    1st Diag lesion is 50% stenosed.    Mid Cx to Dist Cx lesion is 30% stenosed.    Widely patent mid RCA stent    The left ventricular systolic function is normal.    LV end diastolic pressure is low.    The left ventricular ejection fraction is 55-65% by visual estimate.    There is no aortic valve stenosis.   Widely patent RCA stent.  Mild to moderate diffuse nonobstructive disease  in the left system, essentially unchanged from prior catheterization.   Continue aggressive risk factor management and medical therapy.  Echocardiogram 10/13/2021 ([place]):  1. Left ventricular ejection fraction, by estimation, is 55 to 60%. The left ventricle has normal function. The left ventricle has no regional wall motion abnormalities. Left ventricular diastolic parameters are consistent with Grade I diastolic  dysfunction (impaired relaxation).   2. Right ventricular systolic function is normal. The right ventricular size is normal.   3. The mitral valve is normal in structure. No evidence of mitral valve regurgitation. No evidence of mitral stenosis.   4. The aortic valve is tricuspid. Aortic valve regurgitation is not visualized. No aortic stenosis is present.   5. The inferior vena cava is normal in size with greater than 50% respiratory variability, suggesting right atrial pressure of 3 mmHg.   Past Medical History:  Diagnosis Date   Acute bronchitis 05/28/2013   Last Assessment & Plan:   Formatting of this note might be different from the original.  Rx azithromycin .  Rx Tussionex. Increase fluid intake. Rest. Saline nasal spray. Probiotic. Mucinex as needed for congestion. Humidifier in bedroom. Call or return to clinic if symptoms not improving.    Past Surgical History:  Procedure Laterality Date   CARDIAC CATHETERIZATION Left 08/08/2022   CV CATHETERIZATION LEFT HEART performed by Alverna Lamar Sieving, DO at [facility] INVASIVE LAB         Family History   Problem Relation Name Age of Onset   Dementia Mother     Coronary artery disease Mother     Coronary artery disease Father     Valvular heart disease Father         Aortic valve replacement    Current Outpatient Medications  Medication Sig Dispense Refill   allopurinoL  (ZYLOPRIM ) 100 mg tablet Take 200 mg by mouth 2 (two) times a day for 360 days. 360 tablet 3   aspirin  81 mg chewable tablet Take 81 mg by mouth Once Daily.     atorvastatin  (LIPITOR ) 80 mg tablet Take 80 mg by mouth Once Daily.     colchicine 0.6 mg tablet Take 2 tablets at first sign of flare followed by 1 tablet 1 hour later (3 tabs total on first day), thereafter take 1 tab once daily until flare resolves 30 tablet 0   eszopiclone  (LUNESTA ) 2 mg tablet TAKE 1 TABLET BY MOUTH AT BEDTIME AS NEEDED FOR SLEEP (TAKE IMMEDIATELY BEFORE BEDTIME) 30 tablet 5   isosorbide  mononitrate (IMDUR ) 30 mg 24 hr tablet Take 30 mg by mouth Once Daily.     losartan  (COZAAR ) 25 mg tablet Take 25 mg by mouth Once Daily.     metoprolol  succinate (TOPROL  XL) 25 mg 24 hr tablet Take 25 mg by mouth Once Daily.     nitroglycerin  (NITROSTAT ) 0.4 mg SL tablet Place 1 tablet (0.4 mg total) under the tongue as needed for chest pain. 15 tablet 5   pantoprazole  (PROTONIX ) 40 mg EC tablet Take 1 tablet (40 mg total) by mouth every morning before breakfast. 90 tablet 3   traZODone (DESYREL) 100 mg tablet Take 1.5 tablets (150 mg total) by mouth at bedtime. 135 tablet 3   No current facility-administered medications for this visit.    Allergies  Allergen Reactions   Ticagrelor  Anaphylaxis   Venom-Honey Bee Other (See Comments)     REVIEW OF SYSTEMS:  All other systems were reviewed and were negative in detail except as noted in the HPI.  PHYSICAL EXAMINATION: Vitals:   09/27/22 1439  BP: 111/71  Pulse: 60  Resp: 16  SpO2: 98%   General:  Resting comfortably in NAD Skin:  Warm & dry Neuro:  Alert HEENT:  Sclera  anicteric.   Resp:  Resp even and nonlabored.  Lungs sounds clear throughout CV:  S1S2 regular rate and rhythm, no murmur noted, No gallops or rubs Abd:  Soft, nontender, NABS Ext:  No cyanosis or edema MSK:  Moving all extremities.   Neck:  No thyromegaly.  No JVD.  no carotid bruit(s) Pulses:  2+ and symmetrical upper and lower extremities.   Heme: No signs of bleeding or excessive bruising.  Labs/Imaging: No results found for: CKTOTAL, CKMB, CKMBINDEX   .    Lab Results  Component Value Date   AST 31 08/02/2022   ALT 29 08/02/2022   BILITOT 1.2 (H) 08/02/2022   CHOL 134 09/19/2021   HDL 46 09/19/2021

## 2022-10-07 ENCOUNTER — Other Ambulatory Visit: Payer: Self-pay | Admitting: Cardiology

## 2022-11-03 ENCOUNTER — Other Ambulatory Visit: Payer: Self-pay | Admitting: Cardiology

## 2022-11-05 ENCOUNTER — Other Ambulatory Visit: Payer: Self-pay | Admitting: Cardiology

## 2022-12-08 ENCOUNTER — Other Ambulatory Visit: Payer: Self-pay | Admitting: Cardiology

## 2022-12-23 ENCOUNTER — Other Ambulatory Visit: Payer: Self-pay

## 2022-12-23 MED ORDER — ATORVASTATIN CALCIUM 80 MG PO TABS: 80.0000 mg | ORAL_TABLET | Freq: Every day | ORAL | 0 refills | Status: DC

## 2022-12-25 ENCOUNTER — Other Ambulatory Visit: Payer: Self-pay

## 2022-12-25 MED ORDER — ATORVASTATIN CALCIUM 80 MG PO TABS: 80.0000 mg | ORAL_TABLET | Freq: Every day | ORAL | 0 refills | Status: AC

## 2023-02-10 IMAGING — DX DG CHEST 2V
2 series · 2 of 2 positions shown · non-contrast
Comparison: 10/13/2013

CLINICAL DATA: Pt presents from PCP for CP and EKG re-evaluation.
Pt states CP has been intermittent for three days, worsening today.
Pt also endorses SOB.

EXAM:
CHEST - 2 VIEW

[chest pa]
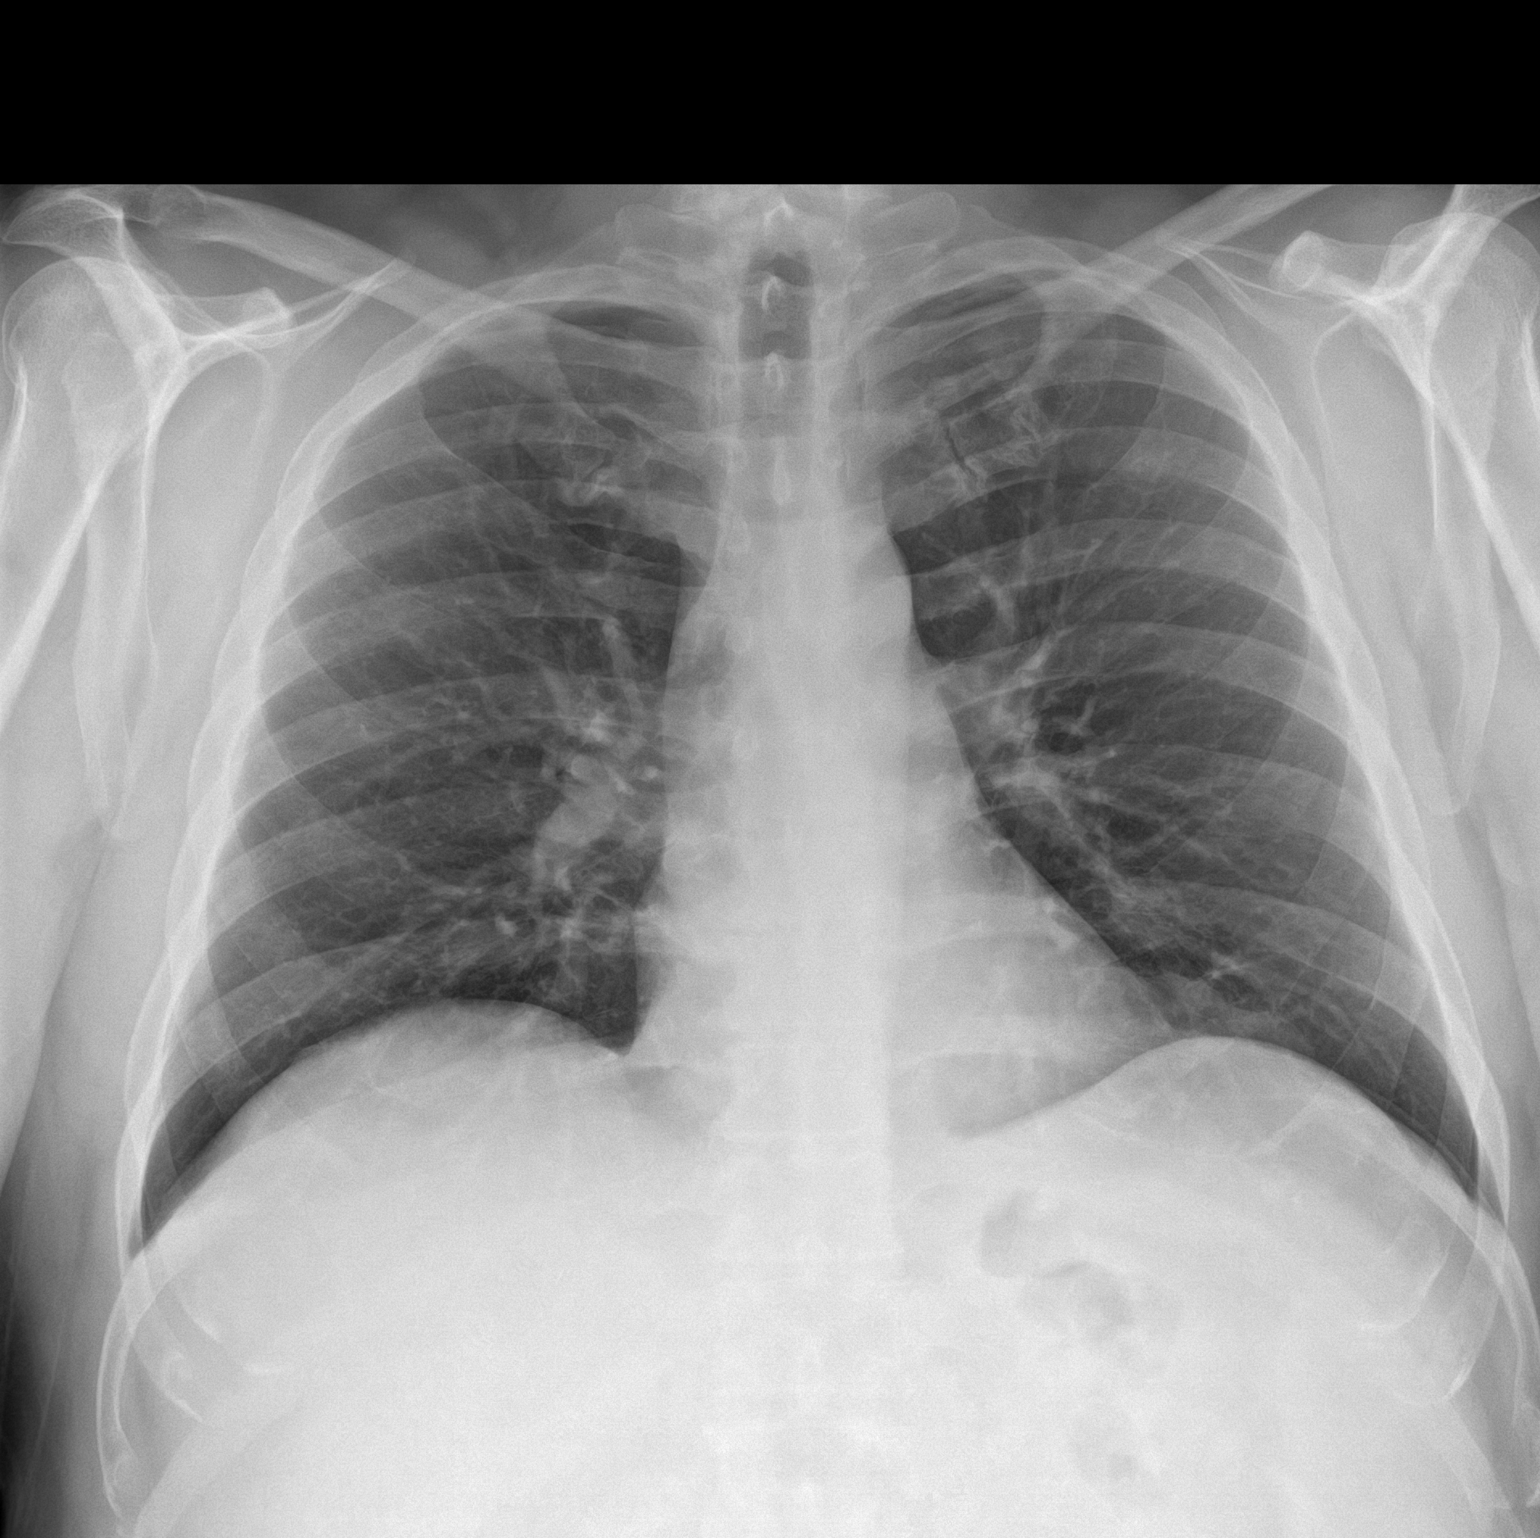

[chest lat]
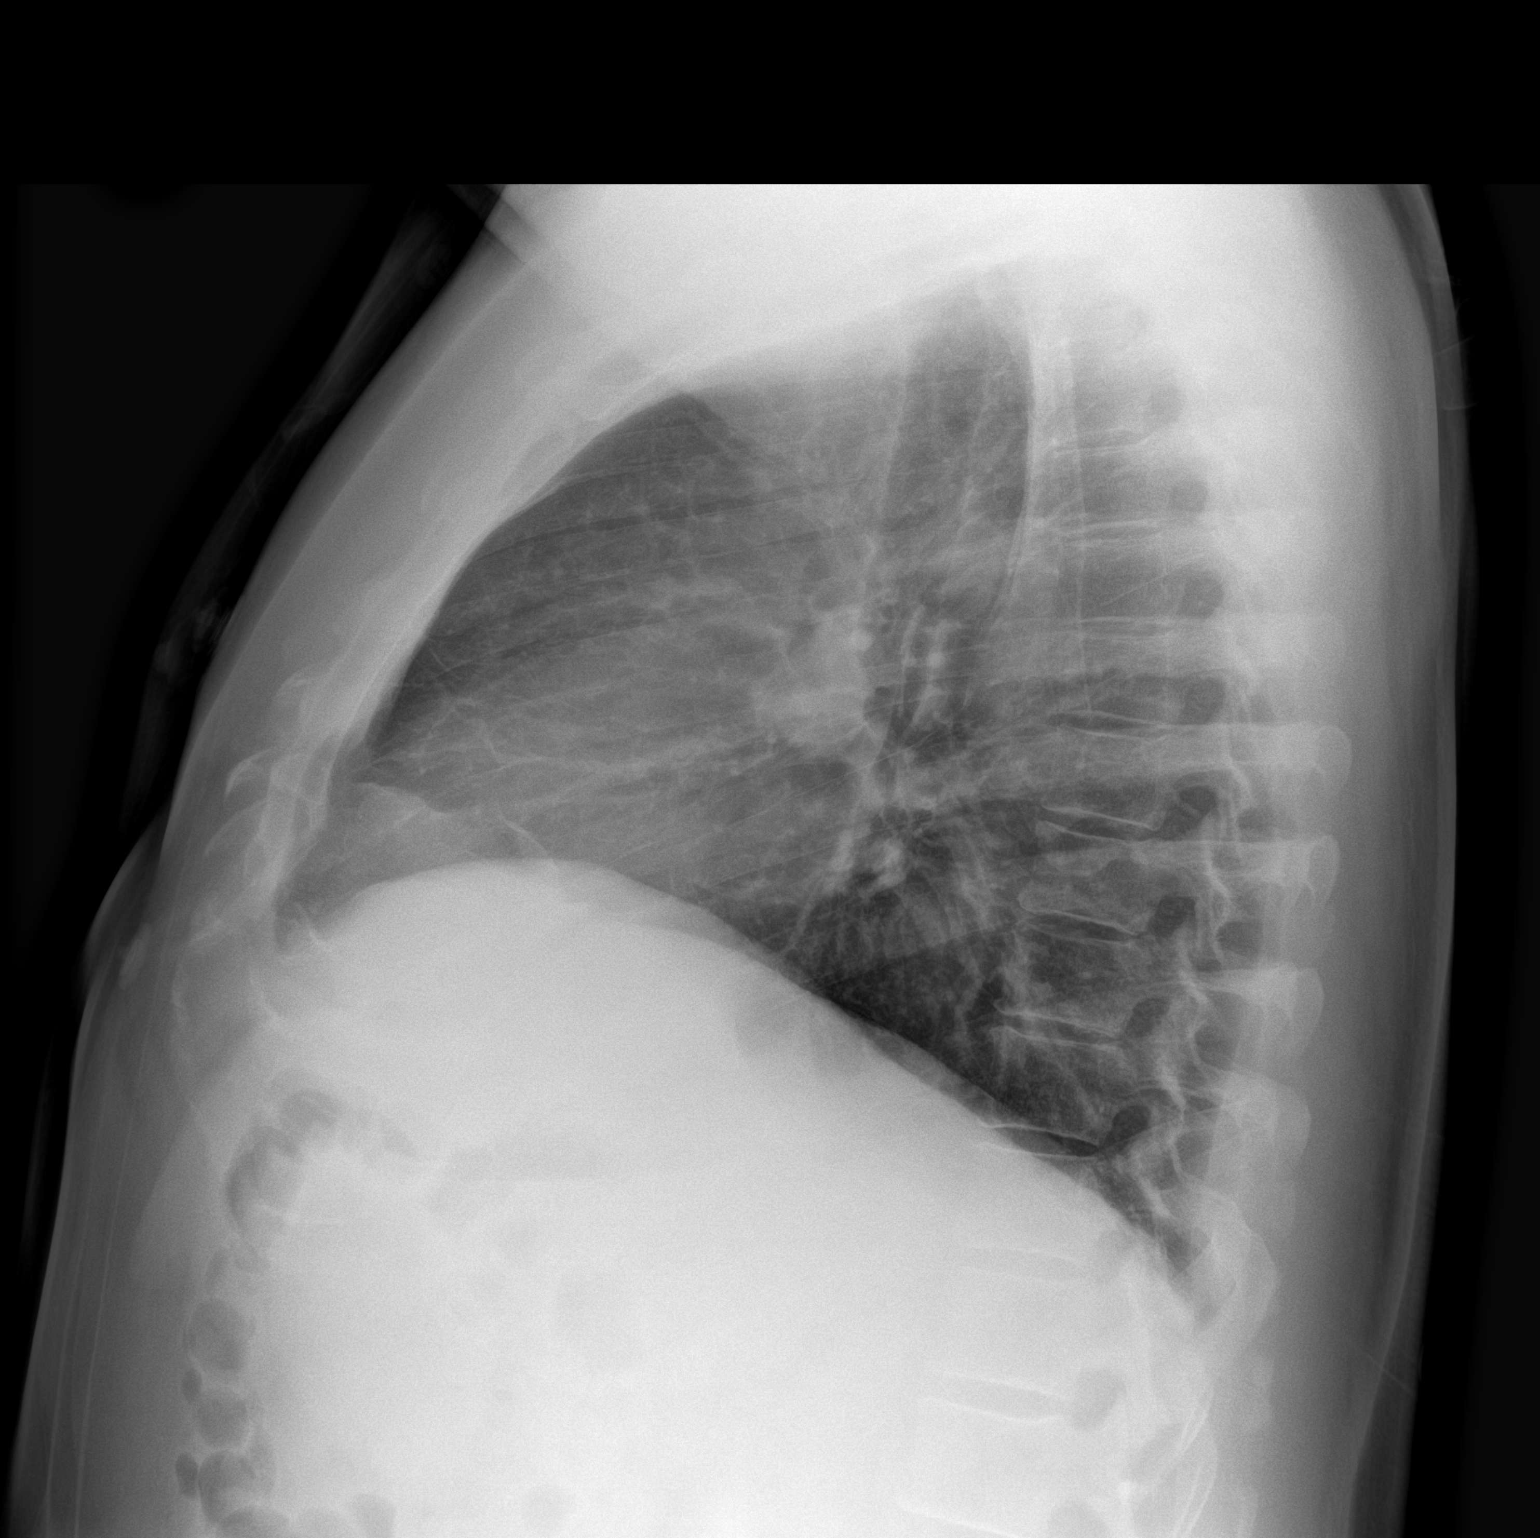

[2 of 2 positions shown; findings below may reference images not displayed]

FINDINGS: Lungs are clear.

Heart size and mediastinal contours are within normal limits.

No effusion.

Visualized bones unremarkable.
IMPRESSION: No acute cardiopulmonary disease.

## 2023-02-19 IMAGING — DX DG CHEST 1V PORT
1 series · 1 of 1 positions shown · non-contrast
Comparison: [DATE][REDACTED] 8188

CLINICAL DATA: Shortness of breath.

EXAM:
PORTABLE CHEST 1 VIEW

[chest ap]
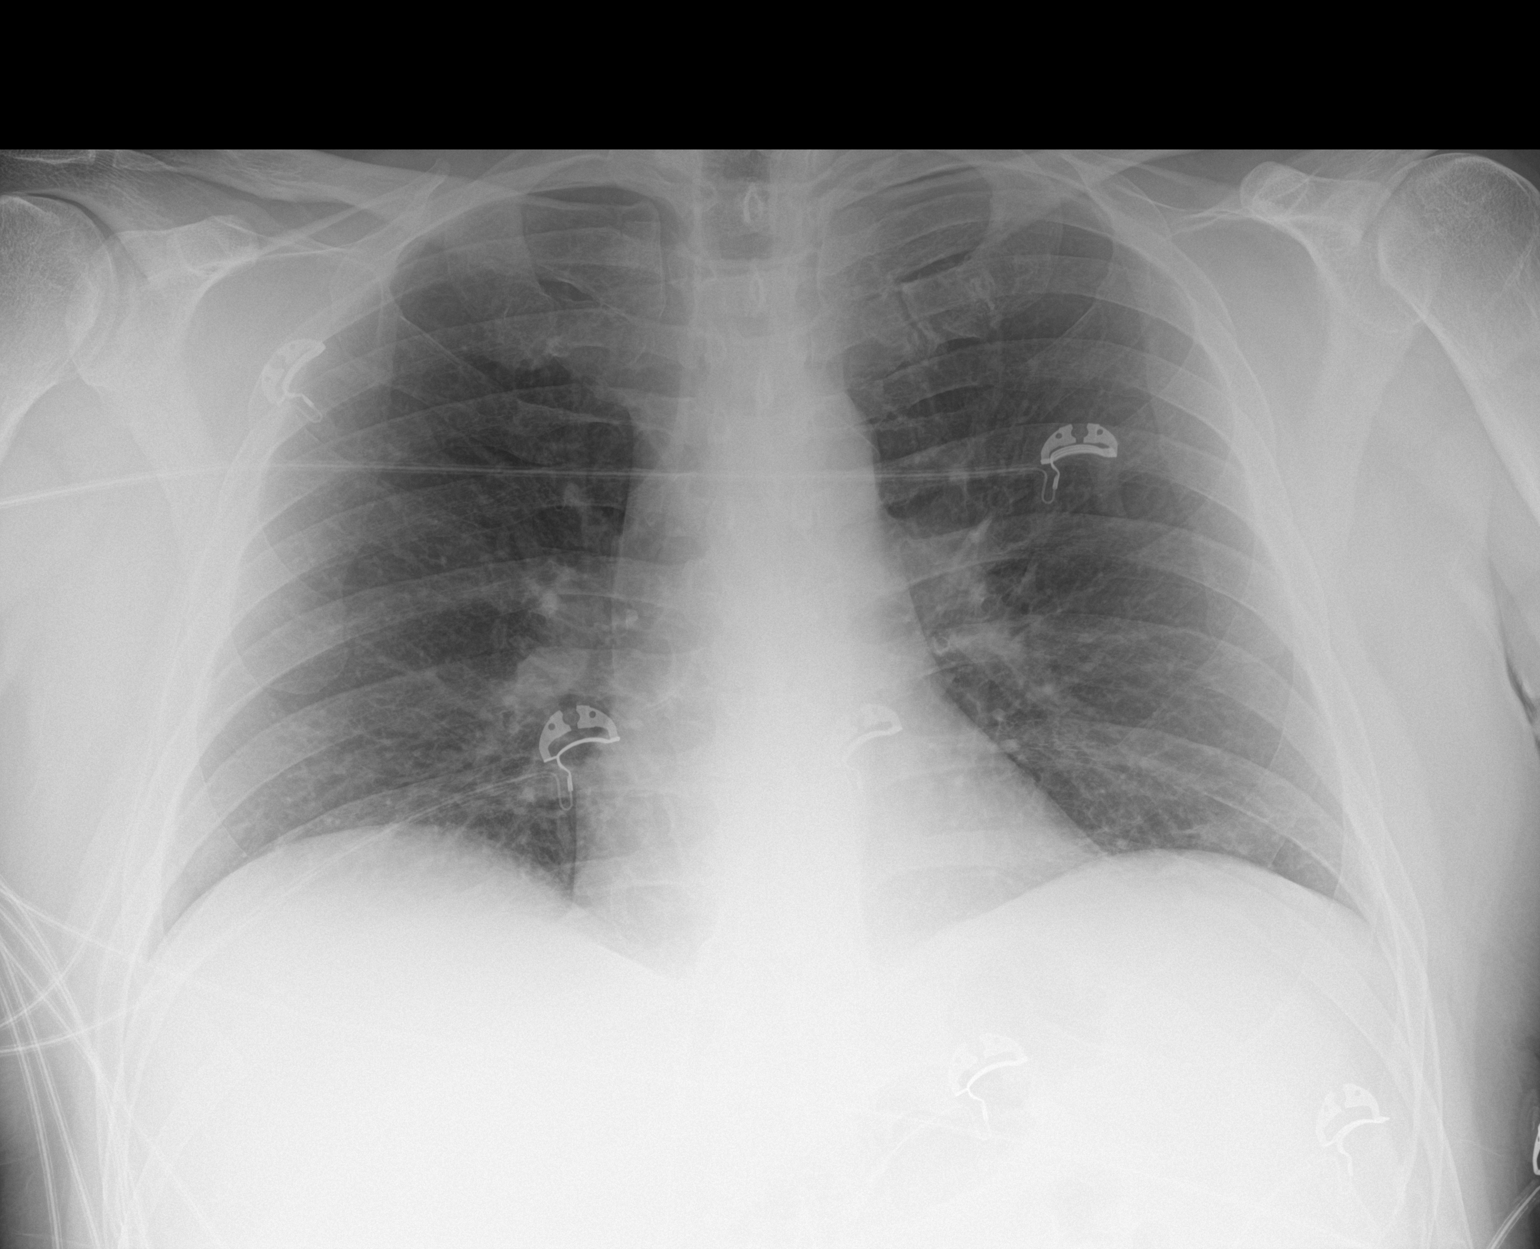

[1 of 1 positions shown; findings below may reference images not displayed]

FINDINGS: The heart size and mediastinal contours are within normal limits.
Both lungs are clear. The visualized skeletal structures are
unremarkable.
IMPRESSION: No active disease.

## 2023-05-13 ENCOUNTER — Other Ambulatory Visit: Payer: Self-pay | Admitting: Cardiology

## 2024-01-14 NOTE — Telephone Encounter (Signed)
" °  Support Services: When patient returns call, please complete the following:      Message Video verification sent.    "

## 2024-06-08 ENCOUNTER — Encounter (HOSPITAL_BASED_OUTPATIENT_CLINIC_OR_DEPARTMENT_OTHER): Payer: Self-pay | Admitting: Emergency Medicine

## 2024-06-08 ENCOUNTER — Emergency Department (HOSPITAL_BASED_OUTPATIENT_CLINIC_OR_DEPARTMENT_OTHER)

## 2024-06-08 ENCOUNTER — Other Ambulatory Visit: Payer: Self-pay

## 2024-06-08 ENCOUNTER — Observation Stay (HOSPITAL_BASED_OUTPATIENT_CLINIC_OR_DEPARTMENT_OTHER): Admission: EM | Admit: 2024-06-08 | Discharge: 2024-06-10 | Disposition: A

## 2024-06-08 DIAGNOSIS — Z79899 Other long term (current) drug therapy: Secondary | ICD-10-CM | POA: Insufficient documentation

## 2024-06-08 DIAGNOSIS — M549 Dorsalgia, unspecified: Secondary | ICD-10-CM | POA: Diagnosis present

## 2024-06-08 DIAGNOSIS — F4310 Post-traumatic stress disorder, unspecified: Secondary | ICD-10-CM | POA: Diagnosis present

## 2024-06-08 DIAGNOSIS — I10 Essential (primary) hypertension: Secondary | ICD-10-CM

## 2024-06-08 DIAGNOSIS — M545 Low back pain, unspecified: Secondary | ICD-10-CM | POA: Insufficient documentation

## 2024-06-08 DIAGNOSIS — I2510 Atherosclerotic heart disease of native coronary artery without angina pectoris: Secondary | ICD-10-CM | POA: Diagnosis present

## 2024-06-08 DIAGNOSIS — I5032 Chronic diastolic (congestive) heart failure: Secondary | ICD-10-CM | POA: Diagnosis present

## 2024-06-08 DIAGNOSIS — I110 Hypertensive heart disease with heart failure: Secondary | ICD-10-CM | POA: Insufficient documentation

## 2024-06-08 DIAGNOSIS — R0789 Other chest pain: Principal | ICD-10-CM | POA: Insufficient documentation

## 2024-06-08 DIAGNOSIS — Z7982 Long term (current) use of aspirin: Secondary | ICD-10-CM | POA: Insufficient documentation

## 2024-06-08 DIAGNOSIS — E785 Hyperlipidemia, unspecified: Secondary | ICD-10-CM | POA: Diagnosis present

## 2024-06-08 DIAGNOSIS — I252 Old myocardial infarction: Secondary | ICD-10-CM | POA: Insufficient documentation

## 2024-06-08 DIAGNOSIS — R079 Chest pain, unspecified: Principal | ICD-10-CM | POA: Diagnosis present

## 2024-06-08 LAB — HEPATIC FUNCTION PANEL
ALT: 16 U/L (ref 0–44)
AST: 27 U/L (ref 15–41)
Albumin: 4.4 g/dL (ref 3.5–5.0)
Alkaline Phosphatase: 85 U/L (ref 38–126)
Bilirubin, Direct: 0.3 mg/dL — ABNORMAL HIGH (ref 0.0–0.2)
Indirect Bilirubin: 0.4 mg/dL (ref 0.3–0.9)
Total Bilirubin: 0.7 mg/dL (ref 0.0–1.2)
Total Protein: 6.5 g/dL (ref 6.5–8.1)

## 2024-06-08 LAB — BASIC METABOLIC PANEL WITH GFR
Anion gap: 12 (ref 5–15)
BUN: 13 mg/dL (ref 8–23)
CO2: 25 mmol/L (ref 22–32)
Calcium: 9.2 mg/dL (ref 8.9–10.3)
Chloride: 104 mmol/L (ref 98–111)
Creatinine, Ser: 0.93 mg/dL (ref 0.61–1.24)
GFR, Estimated: >60 mL/min
Glucose, Bld: 89 mg/dL (ref 70–99)
Potassium: 4.4 mmol/L (ref 3.5–5.1)
Sodium: 141 mmol/L (ref 135–145)

## 2024-06-08 LAB — CBC
HCT: 42.2 % (ref 39.0–52.0)
Hemoglobin: 14.6 g/dL (ref 13.0–17.0)
MCH: 29.6 pg (ref 26.0–34.0)
MCHC: 34.6 g/dL (ref 30.0–36.0)
MCV: 85.6 fL (ref 80.0–100.0)
Platelets: 146 10*3/uL — ABNORMAL LOW (ref 150–400)
RBC: 4.93 MIL/uL (ref 4.22–5.81)
RDW: 12.7 % (ref 11.5–15.5)
WBC: 5.2 10*3/uL (ref 4.0–10.5)
nRBC: 0 % (ref 0.0–0.2)

## 2024-06-08 LAB — TROPONIN T, HIGH SENSITIVITY
Troponin T High Sensitivity: 8 ng/L (ref 0–19)
Troponin T High Sensitivity: 8 ng/L (ref 0–19)

## 2024-06-08 LAB — PRO BRAIN NATRIURETIC PEPTIDE: Pro Brain Natriuretic Peptide: <50 pg/mL

## 2024-06-08 LAB — D-DIMER, QUANTITATIVE: D-Dimer, Quant: 0.3 ug{FEU}/mL (ref 0.00–0.50)

## 2024-06-08 LAB — LIPASE, BLOOD: Lipase: 60 U/L — ABNORMAL HIGH (ref 11–51)

## 2024-06-08 NOTE — Assessment & Plan Note (Signed)
 Patient is scheduled to undergo MRI through [facility] supportive management Patient denies any neurological signs at this time

## 2024-06-08 NOTE — ED Notes (Signed)
 Called lab to add on hepatic function panel to previously collected labwork

## 2024-06-08 NOTE — H&P (Signed)
 "    Leroy Campbell. FMW:[id number] DOB: Apr 21, 1961 DOA: 06/08/2024     PCP: Elena Franky LABOR, PA (Inactive)   [facility]:   CARDS:  Dr. Lynwood Schilling, MD    Patient arrived to ER on 06/08/24 at 1337 Referred by Attending Marjory Meints, MD   Patient coming from:    home Lives With family    Chief Complaint: chest pain    HPI: Leroy Campbell. is a 64 y.o. male with medical history significant of CAD, HLD, HTN , GERD, gout, Thrombocytopenia Splenomegaly , PTSD    Presented with   chest pain and back pain reported sharp pain at rest on the left side felt very sharp lasts 10-15 seconds then releasees. He took nitroglycerin  x2 with no relief  But he feels tightness all around his chest for the past 4 days He feels occasionally short of breath His mother has dementia, his father passed away in 02/16/2025 and he is out of a job  He has been working outside swiping snow but it did not affect the chest pain Reports mild cough for the past few days.   Reports back pain since last week. No injury He was already for this at [facility] and plan to have MRI Jun 17 2024 No neurological signs, no numbness, no trouble walking no incontinence   Of note his Imdur  was stopped 6 months ago by [facility] cardiology   Patient has had CP for the past 4 days initially started as a sharp chest pain but then became more dull ache did radiate to his left shoulder and arm similar to when he had his prior MI needed stents but different from his GERD symptoms He has no history of DVT or PE Patient is on Lipitor  and aspirin  no longer on Plavix  and states he has been compliant Last cardiac catheterization was in April 2024 showed mild nonobstructive CAD widely patent stent in mid RCA with normal LV systolic function and normal EF of 65%  In ER EKG showing bradycardia no evidence of acute ischemia troponin negative x 2 and flat.  D-dimer unremarkable chest x-ray nonacute Given risk factors  patient is being admitted for observation to [facility]     Denies significant ETOH intake  Does not smoke    Regarding pertinent Chronic problems:   Hyperlipidemia - on statins Lipitor  (atorvastatin )  Lipid Panel     Component Value Date/Time   CHOL 101 10/13/2021 0206   TRIG 40 10/13/2021 0206   HDL 41 10/13/2021 0206   CHOLHDL 2.5 10/13/2021 0206   VLDL 8 10/13/2021 0206   LDLCALC 52 10/13/2021 0206   LDLDIRECT 142.0 05/25/2013 0844     HTN on losartan , toprol   chronic CHF diastolic/ - last echo Recent Results (from the past 56199 hours)  ECHOCARDIOGRAM COMPLETE   Collection Time: 10/13/21  3:15 PM  Result Value   Weight 3,365.1   Height 71   BP 129/70   S' Lateral 3.20   Area-P 1/2 3.12   Narrative      ECHOCARDIOGRAM REPORT      1. Left ventricular ejection fraction, by estimation, is 55 to 60%. The left ventricle has normal function. The left ventricle has no regional wall motion abnormalities. Left ventricular diastolic parameters are consistent with Grade I diastolic  dysfunction (impaired relaxation).  2. Right ventricular systolic function is normal. The right ventricular size is normal.  3. The mitral valve is normal in structure. No evidence of  mitral valve regurgitation. No evidence of mitral stenosis.  4. The aortic valve is tricuspid. Aortic valve regurgitation is not visualized. No aortic stenosis is present.  5. The inferior vena cava is normal in size with greater than 50% respiratory variability, suggesting right atrial pressure of 3 mmHg.          CAD  - On Aspirin , statin, betablocker,                 - followed by cardiology                - last cardiac cath     OSA -uses mouth guard    While in ER: Clinical Course as of 06/08/24 2325  Tue Jun 08, 2024  1834 Dr. Solon --> Medicine  [TL]    Clinical Course User Index [TL] Simon Lavonia SAILOR, MD     Lab Orders         Basic metabolic panel         CBC         Pro Brain natriuretic  peptide         Lipase, blood         D-dimer, quantitative         Hepatic function panel     CXR - NON acute  Following Medications were ordered in ER: Medications - No data to display  ___   ED Triage Vitals  Encounter Vitals Group     BP 06/08/24 1352 (!) 144/88     Girls Systolic BP Percentile --      Girls Diastolic BP Percentile --      Boys Systolic BP Percentile --      Boys Diastolic BP Percentile --      Pulse Rate 06/08/24 1352 (!) 56     Resp 06/08/24 1352 15     Temp 06/08/24 1352 98 F (36.7 C)     Temp Source 06/08/24 1352 Oral     SpO2 06/08/24 1352 100 %     Weight 06/08/24 1352 209 lb (94.8 kg)     Height 06/08/24 1352 5' 11.5 (1.816 m)     Head Circumference --      Peak Flow --      Pain Score 06/08/24 1352 3     Pain Loc --      Pain Education --      Exclude from Growth Chart --   UFJK(75)@     _________________________________________ Significant initial  Findings: Abnormal Labs Reviewed  CBC - Abnormal; Notable for the following components:      Result Value   Platelets 146 (*)    All other components within normal limits  LIPASE, BLOOD - Abnormal; Notable for the following components:   Lipase 60 (*)    All other components within normal limits  HEPATIC FUNCTION PANEL - Abnormal; Notable for the following components:   Bilirubin, Direct 0.3 (*)    All other components within normal limits      _________________________ Troponin ***ordered Cardiac Panel (last 3 results) No results for input(s): CKTOTAL, CKMB, TROPONINIHS, RELINDX in the last 72 hours.   ECG: Ordered Personally reviewed and interpreted by me showing: HR : *** Rhythm: *NSR, Sinus tachycardia * A.fib. W RVR, RBBB, LBBB, Paced Ischemic changes*nonspecific changes, no evidence of ischemic changes QTC*  BNP (last 3 results) No results for input(s): BNP in the last 8760 hours.    Recent Labs    06/08/24 1535  DDIMER 0.30   The recent clinical data is  shown below. Vitals:   06/08/24 2000 06/08/24 2200 06/08/24 2311 06/08/24 2314  BP: (!) 143/81 137/86  (!) 143/82  Pulse: (!) 58 (!) 56  (!) 57  Resp: 20 16  16   Temp:    98.1 F (36.7 C)  TempSrc:    Oral  SpO2: 97% 95%  93%  Weight:   96 kg   Height:   6' (1.829 m)     WBC     Component Value Date/Time   WBC 5.2 06/08/2024 1354   LYMPHSABS 0.9 05/06/2021 1232   MONOABS 1.3 (H) 05/06/2021 1232   EOSABS 0.4 05/06/2021 1232   BASOSABS 0.0 05/06/2021 1232      Results for orders placed or performed during the hospital encounter of 10/12/21  MRSA Next Gen by PCR, Nasal     Status: None   Collection Time: 10/12/21  5:15 PM   Specimen: Nasal Mucosa; Nasal Swab  Result Value Ref Range Status   MRSA by PCR Next Gen NOT DETECTED NOT DETECTED Final    Comment: (NOTE) The GeneXpert MRSA Assay (FDA approved for NASAL specimens only), is one component of a comprehensive MRSA colonization surveillance program. It is not intended to diagnose MRSA infection nor to guide or monitor treatment for MRSA infections. Test performance is not FDA approved in patients less than 39 years old. Performed at [facility], [address], [place], [place] [address]   __________________________________________________________ Recent Labs  Lab 06/08/24 1354  NA 141  K 4.4  CO2 25  GLUCOSE 89  BUN 13  CREATININE 0.93  CALCIUM  9.2    Cr   stable,   Lab Results  Component Value Date   CREATININE 0.93 06/08/2024   CREATININE 0.99 07/29/2022   CREATININE 1.17 10/15/2021    Recent Labs  Lab 06/08/24 1354  AST 27  ALT 16  ALKPHOS 85  BILITOT 0.7  PROT 6.5  ALBUMIN 4.4   Lab Results  Component Value Date   CALCIUM  9.2 06/08/2024   Plt: Lab Results  Component Value Date   PLT 146 (L) 06/08/2024      Recent Labs  Lab 06/08/24 1354  WBC 5.2  HGB 14.6  HCT 42.2  MCV 85.6  PLT 146*    HG/HCT   stable,       Component Value Date/Time   HGB 14.6 06/08/2024  1354   HCT 42.2 06/08/2024 1354   MCV 85.6 06/08/2024 1354     Recent Labs  Lab 06/08/24 1535  LIPASE 60*   No results for input(s): AMMONIA in the last 168 hours.    _______________________________________________ Hospitalist was called for admission for   atypical chest pain    The following Work up has been ordered so far:  Orders Placed This Encounter  Procedures   DG Chest 2 View   Basic metabolic panel   CBC   Pro Brain natriuretic peptide   Lipase, blood   D-dimer, quantitative   Hepatic function panel   Document Height and Actual Weight   ED Cardiac monitoring   Cardiac Monitoring - Continuous Indefinite   Inpatient consult to Cardiology   Consult to hospitalist   ED EKG   EKG 12-Lead   Place in observation (patient's expected length of stay will be less than 2 midnights)     OTHER Significant initial  Findings:  labs showing:     DM  labs:  HbA1C: No results  for input(s): HGBA1C in the last 8760 hours.     CBG (last 3)  No results for input(s): GLUCAP in the last 72 hours.        Cultures: No results found for: SDES, SPECREQUEST, CULT, REPTSTATUS   Radiological Exams on Admission: DG Chest 2 View Result Date: 06/08/2024 CLINICAL DATA:  Chest pain EXAM: CHEST - 2 VIEW COMPARISON:  None Available. FINDINGS: The lungs are clear and negative for focal airspace consolidation, pulmonary edema or suspicious pulmonary nodule. No pleural effusion or pneumothorax. Cardiac and mediastinal contours are within normal limits. No acute fracture or lytic or blastic osseous lesions. The visualized upper abdominal bowel gas pattern is unremarkable. IMPRESSION: Negative chest x-ray. Electronically Signed   By: Wilkie Lent M.D.   On: 06/08/2024 14:43   _______________________________________________________________________________________________________ Latest  Blood pressure (!) 143/82, pulse (!) 57, temperature 98.1 F (36.7 C), temperature  source Oral, resp. rate 16, height 6' (1.829 m), weight 96 kg, SpO2 93%.   Vitals  labs and radiology finding personally reviewed  Review of Systems:    Pertinent positives include:  chest pain,  back pain.   Constitutional:  No weight loss, night sweats, Fevers, chills, fatigue, weight loss  HEENT:  No headaches, Difficulty swallowing,Tooth/dental problems,Sore throat,  No sneezing, itching, ear ache, nasal congestion, post nasal drip,  Cardio-vascular:  No Orthopnea, PND, anasarca, dizziness, palpitations.no Bilateral lower extremity swelling  GI:  No heartburn, indigestion, abdominal pain, nausea, vomiting, diarrhea, change in bowel habits, loss of appetite, melena, blood in stool, hematemesis Resp:  no shortness of breath at rest. No dyspnea on exertion, No excess mucus, no productive cough, No non-productive cough, No coughing up of blood.No change in color of mucus.No wheezing. Skin:  no rash or lesions. No jaundice GU:  no dysuria, change in color of urine, no urgency or frequency. No straining to urinate.  No flank pain.  Musculoskeletal:  No joint pain or no joint swelling. No decreased range of motion. No Psych:  No change in mood or affect. No depression or anxiety. No memory loss.  Neuro: no localizing neurological complaints, no tingling, no weakness, no double vision, no gait abnormality, no slurred speech, no confusion  All systems reviewed and apart from HOPI all are negative _______________________________________________________________________________________________ Past Medical History:   Past Medical History:  Diagnosis Date   CAD in native artery 10/15/2021   Coronary artery disease    Gout    Hx of myocardial infarction 04/2021  10/15/2021   Hyperlipidemia    Ischemic cardiomyopathy    MI (myocardial infarction) (HCC) 04/27/2021   Sleep apnea      Past Surgical History:  Procedure Laterality Date   CARDIAC CATHETERIZATION     CORONARY STENT  INTERVENTION N/A 04/27/2021   Procedure: CORONARY STENT INTERVENTION;  Surgeon: Jordan, Peter M, MD;  Location: [facility] INVASIVE CV LAB;  Service: Cardiovascular;  Laterality: N/A;   KNEE ARTHROSCOPY Bilateral    x2 bilaterally   KNEE SURGERY Bilateral    x's 4-- 2 surgeries on both knees   LEFT AND RIGHT HEART CATHETERIZATION WITH CORONARY ANGIOGRAM N/A 10/19/2013   Procedure: LEFT AND RIGHT HEART CATHETERIZATION WITH CORONARY ANGIOGRAM;  Surgeon: Victory LELON Claudene DOUGLAS, MD;  Location: [facility] CATH LAB;  Service: Cardiovascular;  Laterality: N/A;   LEFT HEART CATH AND CORONARY ANGIOGRAPHY N/A 04/27/2021   Procedure: LEFT HEART CATH AND CORONARY ANGIOGRAPHY;  Surgeon: Jordan, Peter M, MD;  Location: [facility] INVASIVE CV LAB;  Service: Cardiovascular;  Laterality: N/A;  LEFT HEART CATH AND CORONARY ANGIOGRAPHY N/A 10/15/2021   Procedure: LEFT HEART CATH AND CORONARY ANGIOGRAPHY;  Surgeon: Dann Candyce RAMAN, MD;  Location: [facility] INVASIVE CV LAB;  Service: Cardiovascular;  Laterality: N/A;   SHOULDER SURGERY Right 05/06/2009   TONSILLECTOMY AND ADENOIDECTOMY      Social History:  Ambulatory   independently      reports that he has never smoked. He has never used smokeless tobacco. He reports current alcohol use. He reports that he does not use drugs.   Family History:   Family History  Problem Relation Age of Onset   Heart disease Mother        Blockage   Heart disease Maternal Grandfather    Diabetes Paternal Grandmother    ______________________________________________________________________________________________ Allergies: Allergies[1]   Prior to Admission medications  Medication Sig Start Date End Date Taking? Authorizing Provider  allopurinol  (ZYLOPRIM ) 100 MG tablet Take 200 mg by mouth 2 (two) times daily. 04/09/21   [provider]  aspirin  81 MG chewable tablet Chew 1 tablet (81 mg total) by mouth daily. 04/30/21   Barrett, Shona MATSU, PA-C  atorvastatin  (LIPITOR ) 80 MG tablet Take  1 tablet (80 mg total) by mouth daily. 12/25/22   Lavona Agent, MD  clopidogrel  (PLAVIX ) 75 MG tablet Take 1 tablet (75 mg total) by mouth daily. 10/16/21   Ingold, Laura R, NP  colchicine 0.6 MG tablet Take 0.6 mg by mouth daily as needed (gout). 05/02/21   [provider]  eszopiclone  (LUNESTA ) 2 MG TABS tablet Take 1 tablet (2 mg total) by mouth at bedtime as needed for sleep. Take immediately before bedtime 11/20/21   Burnard Debby LABOR, MD  isosorbide  mononitrate (IMDUR ) 30 MG 24 hr tablet Take 1 tablet by mouth once daily 05/13/23   Lavona Agent, MD  losartan  (COZAAR ) 25 MG tablet Take 1 tablet by mouth once daily 06/21/22   Hochrein, Zinedine, MD  metoprolol  succinate (TOPROL -XL) 25 MG 24 hr tablet Take 1/2 (one-half) tablet by mouth once daily 11/05/22   Lavona Agent, MD  nitroGLYCERIN  (NITROSTAT ) 0.4 MG SL tablet Place 1 tablet (0.4 mg total) under the tongue every 5 (five) minutes x 3 doses as needed for chest pain. 04/29/21   Barrett, Shona MATSU, PA-C  traZODone (DESYREL) 50 MG tablet Indications: insomnia associated with depression 06/03/22   [provider]  vitamin B-12 (CYANOCOBALAMIN) 1000 MCG tablet Take 1,000 mcg by mouth 3 (three) times a week.    [provider]  vitamin C (ASCORBIC ACID) 500 MG tablet Take 500 mg by mouth daily.    [provider]    ___________________________________________________________________________________________________ Physical Exam:    06/08/2024   11:14 PM 06/08/2024   11:11 PM 06/08/2024   10:00 PM  Vitals with BMI  Height  6' 0   Weight  211 lbs 11 oz   BMI  28.71   Systolic 143  137  Diastolic 82  86  Pulse 57  56     1. General:  in No  Acute distress   well   -appearing 2. Psychological: Alert and   Oriented 3. Head/ENT:    Dry Mucous Membranes                          Head Non traumatic, neck supple                    Poor Dentition 4. SKIN:  decreased Skin turgor,  Skin clean Dry and intact no  rash   5. Heart: Regular rate and rhythm no  Murmur, no Rub or gallop 6. Lungs:  Clear to auscultation bilaterally, no wheezes or crackles   7. Abdomen: Soft,  non-tender, Non distended   bowel sounds present 8. Lower extremities: no clubbing, cyanosis, no  edema 9. Neurologically Grossly intact, moving all 4 extremities equally   10. MSK: Normal range of motion    Chart has been reviewed  ______________________________________________________________________________________________  Assessment/Plan 64 y.o. male with medical history significant of CAD, HLD, HTN , GERD, gout, Thrombocytopenia Splenomegaly , PTSD   Admitted for atypical     Present on Admission:  Chest pain  CAD in native artery  Hyperlipidemia  Chronic diastolic CHF (congestive heart failure) (HCC)  PTSD (post-traumatic stress disorder)  Back pain     CAD in native artery Can continue aspirin  81 mg daily Lipitor  80 mg and Toprol  12.5 mg daily Continue to monitor on telemetry serial EKG  Chest pain Per history sounds somewhat atypical.  Troponin unremarkable x 2 obtain echogram if abnormal will need cardiology consult otherwise follow-up as an outpatient with cardiology.  Monitor on telemetry and obtain serial EKG  Hyperlipidemia Continue Lipitor  80 mg a day  Chronic diastolic CHF (congestive heart failure) (HCC) Chronic stable currently appears to be euvolemic  PTSD (post-traumatic stress disorder) Continue Zoloft  100 mg a day  Back pain Patient is scheduled to undergo MRI through [place] supportive management Patient denies any neurological signs at this time    Other plan as per orders.  DVT prophylaxis:  SCD    Code Status:    Code Status: Prior FULL CODE  as per patient   I had personally discussed CODE STATUS with patient  ACP   none    Family Communication:   Family not at  Bedside    Diet heart healthy   Disposition Plan:          To home once workup is complete and patient is stable    Following barriers for discharge:                             Chest pain work up is complete                                   Consult Orders  (From admission, onward)           Start     Ordered   06/08/24 1853  Consult to hospitalist  Called [facility] at 1854 spoke with Debby  Once       Provider:  (Not yet assigned)  Question Answer Comment  Place call to: [facility]   Reason for Consult Admit      06/08/24 1852                             Consults called:   NONE   Admission status:  ED Disposition     ED Disposition  Admit   Condition  --   Comment  Hospital Area: [facility] [[id number]]  Level of Care: Telemetry [5]  Interfacility transfer: Yes  Diagnosis: Chest pain [255200]  Admitting Physician: CHARLTON EVALENE RAMAN [[id number]]  Attending Physician: CHARLTON EVALENE RAMAN [[id number]]           Obs  Level of care     tele  For 12H    Roxine Whittinghill 06/08/2024, 12:03 AM   [facility]     after 2 AM please page floor coverage   If 7AM-7PM, please contact the day team taking care of the patient using [email or web address]        [1]  Allergies Allergen Reactions   Bee Venom Anaphylaxis and Swelling   Brilinta  [Ticagrelor ] Anaphylaxis and Hives   "

## 2024-06-08 NOTE — Assessment & Plan Note (Signed)
Continue Zoloft 100 mg a day

## 2024-06-08 NOTE — Subjective & Objective (Addendum)
 Patient has had CP for the past 4 days initially started as a sharp chest pain but then became more dull ache did radiate to his left shoulder and arm similar to when he had his prior MI needed stents but different from his GERD symptoms He has no history of DVT or PE Patient is on Lipitor  and aspirin  no longer on Plavix  and states he has been compliant Last cardiac catheterization was in April 2024 showed mild nonobstructive CAD widely patent stent in mid RCA with normal LV systolic function and normal EF of 65%  In ER EKG showing bradycardia no evidence of acute ischemia troponin negative x 2 and flat.  D-dimer unremarkable chest x-ray nonacute Given risk factors patient is being admitted for observation to [facility]

## 2024-06-08 NOTE — Assessment & Plan Note (Signed)
 Chronic stable currently appears to be euvolemic

## 2024-06-08 NOTE — Progress Notes (Signed)
 Plan of Care Note for accepted transfer   Patient: Leroy Campbell. MRN: [id number]   DOA: 06/08/2024  Facility requesting transfer: [facility]   Requesting Provider: Dr. Simon   Reason for transfer: Chest pain   Facility course: 64 yr old man with hx of OSA, HTN, and CAD presents with 4 days of exertional chest pain. Troponin is normal x2, D-dimer is normal, there are no acute ischemic changes on EKG, and no acute findings on CXR. Cardiology (Dr. Kriste) recommended medical admission.   Plan of care: The patient is accepted for admission to Telemetry unit, at [facility].   Author: Evalene GORMAN Sprinkles, MD 06/08/2024  Check [email or web address] for on-call coverage.  Nursing staff, Please call [facility] Admits & Consults System-Wide number on Amion as soon as patient's arrival, so appropriate admitting provider can evaluate the pt.

## 2024-06-08 NOTE — Assessment & Plan Note (Signed)
Continue Lipitor 80 mg a day.

## 2024-06-08 NOTE — Assessment & Plan Note (Signed)
 Per history sounds somewhat atypical. Has 2 components Sharp chest pain that seems MSK and chest pressure that is persistent .   Troponin unremarkable x 2 obtain echogram will need close follow-up as an outpatient with cardiology.  Monitor on telemetry and obtain serial EKG

## 2024-06-09 ENCOUNTER — Other Ambulatory Visit: Payer: Self-pay

## 2024-06-09 ENCOUNTER — Observation Stay (HOSPITAL_COMMUNITY)

## 2024-06-09 DIAGNOSIS — I10 Essential (primary) hypertension: Secondary | ICD-10-CM

## 2024-06-09 LAB — LIPID PANEL
Cholesterol: 118 mg/dL (ref 0–200)
HDL: 36 mg/dL — ABNORMAL LOW
LDL Cholesterol: 68 mg/dL (ref 0–99)
Total CHOL/HDL Ratio: 3.3 ratio
Triglycerides: 72 mg/dL
VLDL: 14 mg/dL (ref 0–40)

## 2024-06-09 LAB — COMPREHENSIVE METABOLIC PANEL WITH GFR
ALT: 16 U/L (ref 0–44)
AST: 28 U/L (ref 15–41)
Albumin: 4 g/dL (ref 3.5–5.0)
Alkaline Phosphatase: 84 U/L (ref 38–126)
Anion gap: 9 (ref 5–15)
BUN: 15 mg/dL (ref 8–23)
CO2: 28 mmol/L (ref 22–32)
Calcium: 9 mg/dL (ref 8.9–10.3)
Chloride: 104 mmol/L (ref 98–111)
Creatinine, Ser: 1 mg/dL (ref 0.61–1.24)
GFR, Estimated: >60 mL/min
Glucose, Bld: 85 mg/dL (ref 70–99)
Potassium: 4.6 mmol/L (ref 3.5–5.1)
Sodium: 141 mmol/L (ref 135–145)
Total Bilirubin: 0.7 mg/dL (ref 0.0–1.2)
Total Protein: 6.1 g/dL — ABNORMAL LOW (ref 6.5–8.1)

## 2024-06-09 LAB — CBC
HCT: 40.4 % (ref 39.0–52.0)
Hemoglobin: 14.1 g/dL (ref 13.0–17.0)
MCH: 29.9 pg (ref 26.0–34.0)
MCHC: 34.9 g/dL (ref 30.0–36.0)
MCV: 85.8 fL (ref 80.0–100.0)
Platelets: 140 10*3/uL — ABNORMAL LOW (ref 150–400)
RBC: 4.71 MIL/uL (ref 4.22–5.81)
RDW: 12.4 % (ref 11.5–15.5)
WBC: 4.4 10*3/uL (ref 4.0–10.5)
nRBC: 0 % (ref 0.0–0.2)

## 2024-06-09 LAB — PHOSPHORUS: Phosphorus: 4.4 mg/dL (ref 2.5–4.6)

## 2024-06-09 LAB — MAGNESIUM: Magnesium: 1.9 mg/dL (ref 1.7–2.4)

## 2024-06-09 LAB — HIV ANTIBODY (ROUTINE TESTING W REFLEX): HIV Screen 4th Generation wRfx: NONREACTIVE

## 2024-06-09 LAB — CK: Total CK: 77 U/L (ref 49–397)

## 2024-06-09 MED ORDER — ALLOPURINOL 100 MG PO TABS
200.0000 mg | ORAL_TABLET | Freq: Two times a day (BID) | ORAL | Status: DC
  Administered 2024-06-09 – 2024-06-10 (×4): 200 mg via ORAL
  Filled 2024-06-09 (×4): qty 2

## 2024-06-09 MED ORDER — SODIUM CHLORIDE 0.9 % IV SOLN: INTRAVENOUS | Status: AC

## 2024-06-09 MED ORDER — ALLOPURINOL 100 MG PO TABS: 200.0000 mg | ORAL_TABLET | Freq: Two times a day (BID) | ORAL | Status: DC

## 2024-06-09 MED ORDER — SERTRALINE HCL 100 MG PO TABS
100.0000 mg | ORAL_TABLET | Freq: Every day | ORAL | Status: DC
  Administered 2024-06-09 – 2024-06-10 (×2): 100 mg via ORAL
  Filled 2024-06-09 (×2): qty 1

## 2024-06-09 MED ORDER — FENTANYL CITRATE (PF) 50 MCG/ML IJ SOSY: 12.5000 ug | PREFILLED_SYRINGE | INTRAMUSCULAR | Status: DC | PRN

## 2024-06-09 MED ORDER — ASPIRIN 81 MG PO CHEW: 81.0000 mg | CHEWABLE_TABLET | Freq: Every day | ORAL | Status: DC

## 2024-06-09 MED ORDER — IBUPROFEN 200 MG PO TABS: 400.0000 mg | ORAL_TABLET | ORAL | Status: DC | PRN

## 2024-06-09 MED ORDER — ASPIRIN 81 MG PO CHEW
81.0000 mg | CHEWABLE_TABLET | Freq: Every day | ORAL | Status: DC
  Administered 2024-06-09: 81 mg via ORAL
  Filled 2024-06-09: qty 1

## 2024-06-09 MED ORDER — PANTOPRAZOLE SODIUM 40 MG PO TBEC
40.0000 mg | DELAYED_RELEASE_TABLET | Freq: Every day | ORAL | Status: DC
  Administered 2024-06-09 (×2): 40 mg via ORAL
  Filled 2024-06-09 (×2): qty 1

## 2024-06-09 MED ORDER — METOPROLOL SUCCINATE ER 25 MG PO TB24: 12.5000 mg | ORAL_TABLET | Freq: Every day | ORAL | Status: DC

## 2024-06-09 MED ORDER — ACETAMINOPHEN 325 MG PO TABS: 650.0000 mg | ORAL_TABLET | Freq: Four times a day (QID) | ORAL | Status: DC | PRN

## 2024-06-09 MED ORDER — ATORVASTATIN CALCIUM 80 MG PO TABS
80.0000 mg | ORAL_TABLET | Freq: Every day | ORAL | Status: DC
  Administered 2024-06-09 (×2): 80 mg via ORAL
  Filled 2024-06-09 (×2): qty 1

## 2024-06-09 MED ORDER — METOPROLOL SUCCINATE ER 25 MG PO TB24
12.5000 mg | ORAL_TABLET | Freq: Every day | ORAL | Status: DC
  Administered 2024-06-09 (×2): 12.5 mg via ORAL
  Filled 2024-06-09 (×2): qty 1

## 2024-06-09 MED ORDER — HYDROCODONE-ACETAMINOPHEN 5-325 MG PO TABS
1.0000 | ORAL_TABLET | ORAL | Status: DC | PRN
  Administered 2024-06-09 (×3): 1 via ORAL
  Administered 2024-06-10: 2 via ORAL
  Filled 2024-06-09: qty 1
  Filled 2024-06-09: qty 2
  Filled 2024-06-09 (×2): qty 1

## 2024-06-09 MED ORDER — LOSARTAN POTASSIUM 25 MG PO TABS
25.0000 mg | ORAL_TABLET | Freq: Every day | ORAL | Status: DC
  Administered 2024-06-09 (×2): 25 mg via ORAL
  Filled 2024-06-09 (×2): qty 1

## 2024-06-09 MED ORDER — ONDANSETRON HCL 4 MG PO TABS: 4.0000 mg | ORAL_TABLET | Freq: Four times a day (QID) | ORAL | Status: DC | PRN

## 2024-06-09 MED ORDER — ACETAMINOPHEN 650 MG RE SUPP: 650.0000 mg | Freq: Four times a day (QID) | RECTAL | Status: DC | PRN

## 2024-06-09 MED ORDER — ONDANSETRON HCL 4 MG/2ML IJ SOLN: 4.0000 mg | Freq: Four times a day (QID) | INTRAMUSCULAR | Status: DC | PRN

## 2024-06-09 MED ORDER — ATORVASTATIN CALCIUM 80 MG PO TABS: 80.0000 mg | ORAL_TABLET | Freq: Every day | ORAL | Status: DC

## 2024-06-09 MED ORDER — METHOCARBAMOL 500 MG PO TABS
500.0000 mg | ORAL_TABLET | Freq: Three times a day (TID) | ORAL | Status: DC | PRN
  Administered 2024-06-09: 500 mg via ORAL
  Filled 2024-06-09: qty 1

## 2024-06-09 NOTE — Progress Notes (Signed)
 Echocardiogram Exam rescheduled due to team scheduling needs; will attempt tomorrow.  Leroy Campbell 06/09/2024, 5:40 PM

## 2024-06-09 NOTE — Progress Notes (Addendum)
 " PROGRESS NOTE    Leroy Campbell.  FMW:[id number] DOB: [birth year] DOA: 06/08/2024 PCP: Elena Franky LABOR, PA (Inactive)   Brief Narrative:  Leroy Campbell. is a 64 y.o. male with medical history significant of CAD, HLD, HTN , GERD, gout, Thrombocytopenia Splenomegaly , PTSD  Patient presents with chest pain reported to be sharp subsequently transition to a squeezing pressure-like pain over his left chest and notes concurrent left shoulder pain.  While his biomarkers and EKG are reassuring given his worrisome symptoms and self-reported prior history of partially blocked left heart blood vessels on cardiac cath cardiology was consulted for further evaluation and treatment.  Last cath available per our records April 2024 showed mild nonobstructive CAD widely patent stent in mid RCA with normal LV systolic function and normal EF of 65%  Assessment & Plan:   Principal Problem:   Chest pain Active Problems:   Hyperlipidemia   CAD in native artery   Chronic diastolic CHF (congestive heart failure) (HCC)   PTSD (post-traumatic stress disorder)   Back pain  Acute onset atypical chest pain -Troponin and EKG reassuring -Echo pending -History of chest pressure/chest tightness occurring at rest as well as with exertion worrisome  -cardiology consulted - appreciate insight/recs -> plan for stress test per their schedule  CAD in native artery On aspirin  81 mg daily Lipitor  80 mg and Toprol  12.5 mg daily -Prior cath with minimally obstructive disease 2024   Hyperlipidemia Continue Lipitor  80 mg a day   Chronic diastolic CHF (congestive heart failure) (HCC) Chronic stable currently appears to be euvolemic   PTSD (post-traumatic stress disorder) Continue Zoloft  100 mg a day   Chronic lumbago  Patient is scheduled to undergo MRI through [place] supportive management Patient denies any neurological signs at this time  DVT prophylaxis: SCDs Start: 06/09/24 0000 Code Status:   Code Status:  Full Code Family Communication: None present  Status is: Inpatient  Dispo: The patient is from: Home              Anticipated d/c is to: Home              Anticipated d/c date is: 24 to 48 hours              Patient currently not medically stable for discharge  Consultants:  Cardiology  Procedures:  Pending above  Antimicrobials:  None indicated  Subjective: No acute issues or events overnight, chest pain ongoing, describes as pressure/squeezing in nature even at rest.  Otherwise denies nausea vomiting diarrhea constipation headache fevers chills or shortness of breath  Objective: Vitals:   06/08/24 2311 06/08/24 2314 06/09/24 0112 06/09/24 0500  BP:  (!) 143/82 (!) 143/82 121/75  Pulse:  (!) 57 (!) 57   Resp:  16  18  Temp:  98.1 F (36.7 C)  98 F (36.7 C)  TempSrc:  Oral  Oral  SpO2:  93%    Weight: 96 kg     Height: 6' (1.829 m)      No intake or output data in the 24 hours ending 06/09/24 0805 Filed Weights   06/08/24 1352 06/08/24 2311  Weight: 94.8 kg 96 kg    Examination:  General:  Pleasantly resting in bed, No acute distress. HEENT:  Normocephalic atraumatic.  Sclerae nonicteric, noninjected.  Extraocular movements intact bilaterally. Neck:  Without mass or deformity.  Trachea is midline. Lungs:  Clear to auscultate bilaterally without rhonchi, wheeze, or rales. Heart:  Regular rate and  rhythm.  Without murmurs, rubs, or gallops. Abdomen:  Soft, nontender, nondistended.  Without guarding or rebound. Extremities: Without cyanosis, clubbing, edema, or obvious deformity. Skin:  Warm and dry, no erythema.  Data Reviewed: I have personally reviewed following labs and imaging studies  CBC: Recent Labs  Lab 06/08/24 1354 06/09/24 0538  WBC 5.2 4.4  HGB 14.6 14.1  HCT 42.2 40.4  MCV 85.6 85.8  PLT 146* 140*   Basic Metabolic Panel: Recent Labs  Lab 06/08/24 1354 06/09/24 0538  NA 141 141  K 4.4 4.6  CL 104 104  CO2 25 28  GLUCOSE 89 85   BUN 13 15  CREATININE 0.93 1.00  CALCIUM  9.2 9.0  MG  --  1.9  PHOS  --  4.4   GFR: Estimated Creatinine Clearance: 92.1 mL/min (by C-G formula based on SCr of 1 mg/dL). Liver Function Tests: Recent Labs  Lab 06/08/24 1354 06/09/24 0538  AST 27 28  ALT 16 16  ALKPHOS 85 84  BILITOT 0.7 0.7  PROT 6.5 6.1*  ALBUMIN 4.4 4.0   Recent Labs  Lab 06/08/24 1535  LIPASE 60*   No results for input(s): AMMONIA in the last 168 hours. Coagulation Profile: No results for input(s): INR, PROTIME in the last 168 hours. Cardiac Enzymes: Recent Labs  Lab 06/09/24 0022  CKTOTAL 77   BNP (last 3 results) Recent Labs    06/08/24 1554  PROBNP <50.0   HbA1C: No results for input(s): HGBA1C in the last 72 hours. CBG: No results for input(s): GLUCAP in the last 168 hours. Lipid Profile: Recent Labs    06/09/24 0538  CHOL 118  HDL 36*  LDLCALC 68  TRIG 72  CHOLHDL 3.3   Thyroid  Function Tests: No results for input(s): TSH, T4TOTAL, FREET4, T3FREE, THYROIDAB in the last 72 hours. Anemia Panel: No results for input(s): VITAMINB12, FOLATE, FERRITIN, TIBC, IRON, RETICCTPCT in the last 72 hours. Sepsis Labs: No results for input(s): PROCALCITON, LATICACIDVEN in the last 168 hours.  No results found for this or any previous visit (from the past 240 hours).   Radiology Studies: DG Chest 2 View Result Date: 06/08/2024 CLINICAL DATA:  Chest pain EXAM: CHEST - 2 VIEW COMPARISON:  None Available. FINDINGS: The lungs are clear and negative for focal airspace consolidation, pulmonary edema or suspicious pulmonary nodule. No pleural effusion or pneumothorax. Cardiac and mediastinal contours are within normal limits. No acute fracture or lytic or blastic osseous lesions. The visualized upper abdominal bowel gas pattern is unremarkable. IMPRESSION: Negative chest x-ray. Electronically Signed   By: Wilkie Lent M.D.   On: 06/08/2024 14:43   Scheduled  Meds:  allopurinol   200 mg Oral BID   aspirin   81 mg Oral Daily   atorvastatin   80 mg Oral QHS   losartan   25 mg Oral QHS   metoprolol  succinate  12.5 mg Oral QHS   pantoprazole   40 mg Oral QHS   sertraline   100 mg Oral Daily   Continuous Infusions:  sodium chloride  75 mL/hr at 06/09/24 0112     LOS: 0 days   Time spent:  Elsie JAYSON Montclair, DO [facility]  If 7PM-7AM, please contact night-coverage [email or web address]  06/09/2024, 8:05 AM      "

## 2024-06-09 NOTE — Plan of Care (Signed)
" °  Problem: Education: Goal: Knowledge of General Education information will improve Description: Including pain rating scale, medication(s)/side effects and non-pharmacologic comfort measures Outcome: Progressing   Problem: Health Behavior/Discharge Planning: Goal: Ability to manage health-related needs will improve Outcome: Progressing   Problem: Clinical Measurements: Goal: Ability to maintain clinical measurements within normal limits will improve Outcome: Progressing Goal: Will remain free from infection Outcome: Progressing Goal: Diagnostic test results will improve Outcome: Progressing Goal: Respiratory complications will improve Outcome: Progressing Goal: Cardiovascular complication will be avoided Outcome: Progressing   Problem: Activity: Goal: Risk for activity intolerance will decrease Outcome: Progressing   Problem: Nutrition: Goal: Adequate nutrition will be maintained Outcome: Progressing   Problem: Coping: Goal: Level of anxiety will decrease Outcome: Progressing   Problem: Elimination: Goal: Will not experience complications related to bowel motility Outcome: Progressing Goal: Will not experience complications related to urinary retention Outcome: Progressing   Problem: Pain Managment: Goal: General experience of comfort will improve and/or be controlled Outcome: Not Progressing Note: Pain has stayed at 3/10   Problem: Safety: Goal: Ability to remain free from injury will improve Outcome: Progressing   Problem: Skin Integrity: Goal: Risk for impaired skin integrity will decrease Outcome: Progressing   "

## 2024-06-09 NOTE — Plan of Care (Signed)
" °  Problem: Education: Goal: Knowledge of General Education information will improve Description: Including pain rating scale, medication(s)/side effects and non-pharmacologic comfort measures Outcome: Progressing   Problem: Clinical Measurements: Goal: Will remain free from infection Outcome: Progressing   Problem: Clinical Measurements: Goal: Diagnostic test results will improve Outcome: Progressing   Problem: Clinical Measurements: Goal: Respiratory complications will improve Outcome: Progressing   Problem: Clinical Measurements: Goal: Cardiovascular complication will be avoided Outcome: Progressing   Problem: Safety: Goal: Ability to remain free from injury will improve Outcome: Progressing   Problem: Skin Integrity: Goal: Risk for impaired skin integrity will decrease Outcome: Progressing   "

## 2024-06-09 NOTE — Consult Note (Addendum)
 "  Cardiology Consultation   Patient ID: Leroy Campbell. MRN: [id number]; DOB: 04-29-1961  Admit date: 06/08/2024 Date of Consult: 06/09/2024  PCP:  Elena Franky LABOR, PA (Inactive)   [facility] Providers Cardiologist:  Lynwood Schilling, MD        Patient Profile: Leroy Rachal. is a 64 y.o. male with a hx of CAD, history of MI s/p PCI with DES to RCA in 2022, HLD, HTN, GERD, gout, thrombocytopenia, splenomegaly who is being seen 06/09/2024 for the evaluation of chest pain at the request of Dr. Elsie Montclair.  History of Present Illness: Leroy Campbell has medical history as stated above and previously followed with outpatient cardiology (Dr. Schilling) primarily for CAD, ischemic cardiomyopathy, and dyslipidemia. History of inferior MI s/p PCI with DES to totally occluded RCA with nonobstructive disease of 35% pLAD, 50% D1, and 20% mid-distal LCx in 04/2021; EF 45-50% and normal LVEDP. Started on DAPT with ASA/Brilinta , later changed to ASA/Plavix  due to rash. Completed cardiac rehab and remained stable until he was admitted in June 2023 for nitrate-responsive chest pain. LHC 10/15/21 demonstrated widely patent RCA stent with mild-moderate diffuse nonobstructive CAD largely unchanged from prior catheterization, EF 55-60%. Patient continued on ASA/Plavix , added Imdur  30 mg daily at discharge. Seen in cardiology clinic in July 2023 at which time he was stable with plan to discontinue Plavix  at start of 2024. Later seen by outpatient cardiologist at [facility] in March 2024 due to ongoing intermittent chest pain. Ultimately underwent LHC in April 2024 which showed widely patent stent in mid RCA and mild nonobstructive CAD of 30% pLAD, 30% D1, 40% LCx, 30% pRCA, 20% dRCA; EF 65%. Last seen by [facility] cardiology in office in May 2024 at which time he was stable with improved symptoms.  Presently, patient presented to the ED on 2/3 complaining of chest pain over the past 4 days. Pain was  initially sharp but has now transformed to constant tightness/dull squeezing pain and radiates to upper/mid back with occasional superimposing sharp pain to left mid-sternal border. Notes occasional left shoulder pain as well, although appears to be temporally separate from chest pain. Chest pain is non-reproducible on exam. No exacerbating or alleviating factors. Reports single episode of flushing/sweating during time of particularly severe chest pain a couple days ago that warranted NTG use. Otherwise, denies any associated dyspnea, N/V, fatigue, dizziness/lightheadedness, syncope. Per patient, pain is somewhat similar to anginal symptoms prior to previous MI/PCI and different from his typical GERD symptoms. No relief with NTG x2 or Tums. Notes that he is compliant with CAD GDMT, however of note Imdur  was stopped by [facility] cardiologist approximately a year ago. No history of smoking. In the ED, EKG showed sinus bradycardia without significant ST-T wave changes. Negative troponins x2 and negative D-dimer. Unremarkable CBC and BMP. Normal CXR. Echo pending.  He has had a pretty prolonged episode of chest pain which to him is reminiscent of his previous anginal symptoms, although he has had several CV evaluations since his last PCI for similar symptoms.  However, he has had a prolonged episode of chest pain with negative troponins and no ischemic changes on EKG.  Past Medical History:  Diagnosis Date   CAD in native artery 10/15/2021   Coronary artery disease    Gout    Hx of myocardial infarction 04/2021  10/15/2021   Hyperlipidemia    Ischemic cardiomyopathy    MI (myocardial infarction) (HCC) 04/27/2021   Sleep apnea  Past Surgical History:  Procedure Laterality Date   CARDIAC CATHETERIZATION     CORONARY STENT INTERVENTION N/A 04/27/2021   Procedure: CORONARY STENT INTERVENTION;  Surgeon: Jordan, Peter M, MD;  Location: [facility] INVASIVE CV LAB;  Service: Cardiovascular;  Laterality: N/A;   KNEE  ARTHROSCOPY Bilateral    x2 bilaterally   KNEE SURGERY Bilateral    x's 4-- 2 surgeries on both knees   LEFT AND RIGHT HEART CATHETERIZATION WITH CORONARY ANGIOGRAM N/A 10/19/2013   Procedure: LEFT AND RIGHT HEART CATHETERIZATION WITH CORONARY ANGIOGRAM;  Surgeon: Victory LELON Claudene DOUGLAS, MD;  Location: [facility] CATH LAB;  Service: Cardiovascular;  Laterality: N/A;   LEFT HEART CATH AND CORONARY ANGIOGRAPHY N/A 04/27/2021   Procedure: LEFT HEART CATH AND CORONARY ANGIOGRAPHY;  Surgeon: Jordan, Peter M, MD;  Location: [facility] INVASIVE CV LAB;  Service: Cardiovascular;  Laterality: N/A;   LEFT HEART CATH AND CORONARY ANGIOGRAPHY N/A 10/15/2021   Procedure: LEFT HEART CATH AND CORONARY ANGIOGRAPHY;  Surgeon: Dann Candyce RAMAN, MD;  Location: [facility] INVASIVE CV LAB;  Service: Cardiovascular;  Laterality: N/A;   SHOULDER SURGERY Right 05/06/2009   TONSILLECTOMY AND ADENOIDECTOMY       Scheduled Meds:  allopurinol   200 mg Oral BID   aspirin   81 mg Oral Daily   atorvastatin   80 mg Oral QHS   losartan   25 mg Oral QHS   metoprolol  succinate  12.5 mg Oral QHS   pantoprazole   40 mg Oral QHS   sertraline   100 mg Oral Daily   Continuous Infusions:  PRN Meds: acetaminophen  **OR** acetaminophen , HYDROcodone -acetaminophen , ibuprofen , methocarbamol , ondansetron  **OR** ondansetron  (ZOFRAN ) IV  Allergies:   Allergies[1]  Social History:   Social History   Socioeconomic History   Marital status: Legally Separated    Spouse name: Not on file   Number of children: 2   Years of education: 16   Highest education level: Not on file  Occupational History   Not on file  Tobacco Use   Smoking status: Never   Smokeless tobacco: Never  Substance and Sexual Activity   Alcohol use: Yes    Comment: Rarely   Drug use: No   Sexual activity: Yes    Partners: Female  Other Topics Concern   Not on file  Social History Narrative   Exercise-- bike, coaching basketball,  softball   Social Drivers of Health   Tobacco  Use: Low Risk (06/08/2024)   Patient History    Smoking Tobacco Use: Never    Smokeless Tobacco Use: Never    Passive Exposure: Not on file  Financial Resource Strain: Not on file  Food Insecurity: No Food Insecurity (06/09/2024)   Epic    Worried About Programme Researcher, Broadcasting/film/video in the Last Year: Never true    Ran Out of Food in the Last Year: Never true  Transportation Needs: No Transportation Needs (06/09/2024)   Epic    Lack of Transportation (Medical): No    Lack of Transportation (Non-Medical): No  Physical Activity: Not on file  Stress: Not on file  Social Connections: Not on file  Intimate Partner Violence: Not At Risk (06/09/2024)   Epic    Fear of Current or Ex-Partner: No    Emotionally Abused: No    Physically Abused: No    Sexually Abused: No  Depression (PHQ2-9): Low Risk (07/03/2021)   Depression (PHQ2-9)    PHQ-2 Score: 0  Alcohol Screen: Not on file  Housing: Low Risk (06/09/2024)   Epic    Unable  to Pay for Housing in the Last Year: No    Number of Times Moved in the Last Year: 0    Homeless in the Last Year: No  Utilities: Not At Risk (06/09/2024)   Epic    Threatened with loss of utilities: No  Health Literacy: Not on file    Family History:    Family History  Problem Relation Age of Onset   Heart disease Mother        Blockage   Heart disease Maternal Grandfather    Diabetes Paternal Grandmother      ROS:  Please see the history of present illness.   All other ROS reviewed and negative.     Physical Exam/Data: Vitals:   06/09/24 0500 06/09/24 0831 06/09/24 1247 06/09/24 1731  BP: 121/75 128/78 124/85 128/80  Pulse:  (!) 56 (!) 57 (!) 56  Resp: 18 18 17 16   Temp: 98 F (36.7 C) 98.2 F (36.8 C) 97.8 F (36.6 C) 98.5 F (36.9 C)  TempSrc: Oral Oral Oral Oral  SpO2:  95% 93% 95%  Weight:      Height:        Intake/Output Summary (Last 24 hours) at 06/09/2024 1748 Last data filed at 06/09/2024 1200 Gross per 24 hour  Intake 130 ml  Output --  Net 130  ml      06/08/2024   11:11 PM 06/08/2024    1:52 PM 07/29/2022    3:28 PM  Last 3 Weights  Weight (lbs) 211 lb 11.2 oz 209 lb 212 lb  Weight (kg) 96.026 kg 94.802 kg 96.163 kg     Body mass index is 28.71 kg/m.  General: Well nourished, well developed, in no acute distress Neck: No JVD Vascular: Distal pulses 2+ bilaterally Cardiac: Normal S1, S2; RRR; no murmur; chest pain non-reproducible on exam Lungs: Clear to auscultation bilaterally, no wheezing, rhonchi or rales  Abd: Soft, nontender Ext: No peripheral edema Musculoskeletal: No deformities Skin: Warm and dry  Neuro: No focal abnormalities noted Psych: Normal affect   EKG: The ED EKG was personally reviewed and demonstrates: Sinus bradycardia, no significant ST-T wave changes, unchanged from prior EKG Telemetry:  Telemetry was personally reviewed and demonstrates: Sinus bradycardia with frequent PACs/PVCs, rates in the 50s; brief episode of NSVT for 13 beats at 11:30am  Relevant CV Studies:  CXR [06/08/24]: Negative chest x-ray. The lungs are clear and negative for focal airspace consolidation,pulmonary edema or suspicious pulmonary nodule. No pleural effusion or pneumothorax. Cardiac and mediastinal contours are within normallimits. No acute fracture or lytic or blastic osseous lesions. The visualized upper abdominal bowel gas pattern is unremarkable.  Cardiac Cath-PCI 02/25/2021:: Proximal LAD 35%, D1 50%.  Mid LCx 20%.  Mid RCA 100% (Onyx frontier DES 2.5 x 22 mm).  EF 45 to 50%.  Normal LVEDP. Cardiac Catheterization [08/08/22]: Right dominant.  Proximal LAD 30%, D1 30%.  Mid LCx 40%.  Proximal RCA 30%.  Mid RCA widely patent with distal RCA 20%.  EF estimated 65%.  No RWMA.--Medical management.   TTE [09/06/22]: Normal LV size and function with EF 60 to 65% no RWMA.  Normal RV.  Normal atrial sizes.  Mild aortic valve thickening but no stenosis.  Otherwise normal.   Laboratory Data: High Sensitivity Troponin:  No results for  input(s): TROPONINIHS in the last 720 hours.  Recent Labs  Lab 06/08/24 1354 06/08/24 1554  TRNPT 8 8      Chemistry Recent Labs  Lab 06/08/24 1354 06/09/24  0538  NA 141 141  K 4.4 4.6  CL 104 104  CO2 25 28  GLUCOSE 89 85  BUN 13 15  CREATININE 0.93 1.00  CALCIUM  9.2 9.0  MG  --  1.9  GFRNONAA >60 >60  ANIONGAP 12 9    Recent Labs  Lab 06/08/24 1354 06/09/24 0538  PROT 6.5 6.1*  ALBUMIN 4.4 4.0  AST 27 28  ALT 16 16  ALKPHOS 85 84  BILITOT 0.7 0.7   Lipids  Recent Labs  Lab 06/09/24 0538  CHOL 118  TRIG 72  HDL 36*  LDLCALC 68  CHOLHDL 3.3    Hematology Recent Labs  Lab 06/08/24 1354 06/09/24 0538  WBC 5.2 4.4  RBC 4.93 4.71  HGB 14.6 14.1  HCT 42.2 40.4  MCV 85.6 85.8  MCH 29.6 29.9  MCHC 34.6 34.9  RDW 12.7 12.4  PLT 146* 140*   Thyroid  No results for input(s): TSH, FREET4 in the last 168 hours.  BNP Recent Labs  Lab 06/08/24 1554  PROBNP <50.0    DDimer  Recent Labs  Lab 06/08/24 1535  DDIMER 0.30    Radiology/Studies:  DG Chest 2 View Result Date: 06/08/2024 CLINICAL DATA:  Chest pain EXAM: CHEST - 2 VIEW COMPARISON:  None Available. FINDINGS: The lungs are clear and negative for focal airspace consolidation, pulmonary edema or suspicious pulmonary nodule. No pleural effusion or pneumothorax. Cardiac and mediastinal contours are within normal limits. No acute fracture or lytic or blastic osseous lesions. The visualized upper abdominal bowel gas pattern is unremarkable. IMPRESSION: Negative chest x-ray. Electronically Signed   By: Wilkie Lent M.D.   On: 06/08/2024 14:43    Assessment and Plan: Atypical chest pain --> some features are somewhat anginal in nature based on the similarities to his symptoms and the dull aching squeezing pain, however atypical features are not if not necessarily exertional, constant prolonged anytime and unusual radiation distribution.  This is in conjunction with no significant ST-T wave  changes and negative troponins. CAD with history of MI s/p DES to RCA in 2022 with patent stent in 2023 as well as 2024 and nonischemic Myoview  last year in 2025. Overall atypical central chest pain that is constantly dull/squeezing with radiation to upper/mid back and occasional superimposing sharp pain at left sternal border  Last echo at [facility] in May 2024 showed EF 60-65%. Echo this admission pending Chest pain appears atypical as described above, though of some concern with patient reporting similar chest pain to past MI.  For clarification sake, since we know his anatomy reasonable to pursue ischemic evaluation with a Lexiscan  Myoview  (nuclear myocardial perfusion scan)  Scan scheduled for tomorrow.  Make n.p.o. after midnight.   Previously on Imdur  with good symptom relief, however was stopped a year ago by [facility] cardiologist. Will restart Imdur  30 mg daily Continue ASA, atorvastatin  80 mg daily, Toprol -XL 12.5 mg daily   Chronic HFpEF Stable, appears euvolemic on exam Last echo with preserved EF, normal RV Echo this admission pending Continue home losartan  25 mg daily, Toprol -XL 12.5 mg daily  Hypertension Initially slightly elevated, now stable Continue on home antihypertensives as ordered  Hyperlipidemia Lipid panel this admission showed total cholesterol 118, HDL 36, LDL 68. Goal LDL <55 Continue home atorvastatin  80 mg daily  Risk Assessment/Risk Scores:     [place]  Heart Association (NYHA) Functional Class NYHA Class I    For questions or updates, please contact [facility] Please consult [email or web address] for contact info  under      Signed, Owen MARLA Daniels, PA-C  06/09/2024 5:48 PM   ATTENDING ATTESTATION  I have seen, examined and evaluated the patient this afternoon along with Hanh K Le, PA-C .  After reviewing all the available data and chart, we discussed the patients laboratory, study & physical findings as well as symptoms in detail.  I agree with her  findings, examination as well as impression recommendations as per our discussion.    Attending adjustments noted in italics.   Patient with known history of CAD, presenting with concerning chest discomfort symptoms for him.  Prolonged episode of chest discomfort not Nestl made worse with exertion.  Somewhat central chest pressure tightness and description.  Typical atypical features noted.  Negative troponin and abnormal EKG.  He is still quite concerned I think is not unreasonable to evaluate with a noninvasive imaging studies. - Will plan Lexiscan  Myoview  in the morning and determine plan based on the results.   Informed Consent   Shared Decision Making/Informed Consent The risks [chest pain, shortness of breath, cardiac arrhythmias, dizziness, blood pressure fluctuations, myocardial infarction, stroke/transient ischemic attack, nausea, vomiting, allergic reaction, radiation exposure, metallic taste sensation and life-threatening complications (estimated to be 1 in 10,000)], benefits (risk stratification, diagnosing coronary artery disease, treatment guidance) and alternatives of a nuclear stress test were discussed in detail with Leroy Campbell and he agrees to proceed.   Alm MICAEL Clay, MD, MS Alm Clay, M.D., M.S. Interventional Cardiologist  [facility] Pager # [phone]        [1]  Allergies Allergen Reactions   Bee Venom Anaphylaxis and Swelling   Brilinta  [Ticagrelor ] Anaphylaxis and Hives   "

## 2024-06-09 NOTE — TOC Initial Note (Addendum)
 Transition of Care (TOC) - Initial/Assessment Note    Patient Details  Name: Leroy Campbell. MRN: [id number] Date of Birth: 08-16-1960  Transition of Care [facility]) CM/SW Contact:    Andrez JULIANNA George, RN Phone Number: 06/09/2024, 11:08 AM  Clinical Narrative:                 Leroy JONETTA [name]. is a 64 y.o. male with medical history significant of CAD, HLD, HTN , GERD, gout, Thrombocytopenia Splenomegaly , PTSD.  Pt is from home with his mom who he cares for as she has dementia.  No DME.  Pt manages his own medications and drives self. Pt states his girlfriend or children will provide transport home at dc.   Pt is active with Bonni LIEN for PCP. [facility] has [facility] and [facility]. MD: Dr Tobie SW: Deloras Solian: [phone] ext [phone]  ICM following for dc needs.   Expected Discharge Plan: Home/Self Care Barriers to Discharge: Continued Medical Work up   Patient Goals and CMS Choice            Expected Discharge Plan and Services       Living arrangements for the past 2 months: Single Family Home                                      Prior Living Arrangements/Services Living arrangements for the past 2 months: Single Family Home Lives with:: Parents Patient language and need for interpreter reviewed:: Yes Do you feel safe going back to the place where you live?: Yes        Care giver support system in place?: No (comment)   Criminal Activity/Legal Involvement Pertinent to Current Situation/Hospitalization: No - Comment as needed  Activities of Daily Living      Permission Sought/Granted                  Emotional Assessment Appearance:: Appears stated age Attitude/Demeanor/Rapport: Engaged Affect (typically observed): Accepting Orientation: : Oriented to Place, Oriented to Self, Oriented to  Time, Oriented to Situation   Psych Involvement: No (comment)  Admission diagnosis:  Chest pain on exertion [R07.9] Chest pain  [R07.9] Patient Active Problem List   Diagnosis Date Noted   Chest pain 06/08/2024   Chronic diastolic CHF (congestive heart failure) (HCC) 06/08/2024   PTSD (post-traumatic stress disorder) 06/08/2024   Back pain 06/08/2024   CAD in native artery 10/15/2021   Hx of myocardial infarction 04/2021  10/15/2021   Unstable angina (HCC) 10/12/2021   Coronary artery disease involving native coronary artery of native heart without angina pectoris 05/24/2021   Ischemic cardiomyopathy    Hyperlipidemia    ST elevation myocardial infarction involving right coronary artery (HCC) 04/27/2021   Preventative health care 11/17/2015   Chest discomfort 09/13/2013   EKG, abnormal 09/13/2013   Acute bronchitis 05/28/2013   Gout 02/18/2013   Obesity (BMI 30-39.9) 02/18/2013   PCP:  Elena Franky LABOR, PA (Inactive) Pharmacy:   [id number] [place], [place] - [address] AT [facility] OF [address] & [address] [place] [address] Phone: [phone] Fax: [phone]  [facility] - [place], [place] - [address] [place] [address] Phone: [phone] Fax: [phone]  [facility] #[id number] - [place], [place] - [address] AT [address]  OF [address] [place] [address] Phone: [phone] Fax: [phone]  [facility] STORE #[id number] [place], [place] - [address] AT [facility] OF [address] & [address] [place] [address] Phone: [phone] Fax: [phone]  [facility] [place] [facility] [address] [place], [place] - [address] [place] [address] Phone: [phone] Fax: [phone]  [facility] - [address] [place]), [place] - [address] [place] [address] Phone: [phone] Fax: [phone]     Social Drivers of Health (SDOH) Social History: SDOH Screenings   Food Insecurity: No Food  Insecurity (06/09/2024)  Housing: Low Risk (06/09/2024)  Transportation Needs: No Transportation Needs (06/09/2024)  Utilities: Not At Risk (06/09/2024)  Depression (PHQ2-9): Low Risk (07/03/2021)  Tobacco Use: Low Risk (06/08/2024)   SDOH Interventions:     Readmission Risk Interventions     No data to display

## 2024-06-10 ENCOUNTER — Other Ambulatory Visit (HOSPITAL_COMMUNITY): Payer: Self-pay

## 2024-06-10 ENCOUNTER — Observation Stay (HOSPITAL_COMMUNITY)

## 2024-06-10 LAB — LIPOPROTEIN A (LPA): Lipoprotein (a): 14 nmol/L

## 2024-06-10 LAB — ECHOCARDIOGRAM COMPLETE
AR max vel: 3.08 cm2
AV Area VTI: 3.23 cm2
AV Area mean vel: 2.92 cm2
AV Mean grad: 4 mmHg
AV Peak grad: 7 mmHg
Ao pk vel: 1.32 m/s
Area-P 1/2: 2.97 cm2
Calc EF: 60.9 %
Height: 72 in
MV VTI: 2.11 cm2
S' Lateral: 3.1 cm
Single Plane A2C EF: 58.2 %
Single Plane A4C EF: 63.8 %
Weight: 3387.2 [oz_av]

## 2024-06-10 LAB — MAGNESIUM: Magnesium: 2 mg/dL (ref 1.7–2.4)

## 2024-06-10 LAB — BASIC METABOLIC PANEL WITH GFR
Anion gap: 8 (ref 5–15)
BUN: 15 mg/dL (ref 8–23)
CO2: 30 mmol/L (ref 22–32)
Calcium: 9.7 mg/dL (ref 8.9–10.3)
Chloride: 101 mmol/L (ref 98–111)
Creatinine, Ser: 0.88 mg/dL (ref 0.61–1.24)
GFR, Estimated: >60 mL/min
Glucose, Bld: 99 mg/dL (ref 70–99)
Potassium: 4.8 mmol/L (ref 3.5–5.1)
Sodium: 139 mmol/L (ref 135–145)

## 2024-06-10 LAB — NM MYOCAR MULTI W/SPECT W/WALL MOTION / EF
LV dias vol: 88 mL (ref 62–150)
Nuc Stress EF: 75 %
Peak HR: 93 {beats}/min
Rest HR: 56 {beats}/min
Rest Nuclear Isotope Dose: 9.3 mCi
ST Depression (mm): 0 mm
Stress Nuclear Isotope Dose: 32 mCi

## 2024-06-10 MED ORDER — ISOSORBIDE MONONITRATE ER 30 MG PO TB24
30.0000 mg | ORAL_TABLET | Freq: Every day | ORAL | Status: DC
  Administered 2024-06-10: 30 mg via ORAL
  Filled 2024-06-10: qty 1

## 2024-06-10 MED ORDER — ISOSORBIDE MONONITRATE ER 30 MG PO TB24
30.0000 mg | ORAL_TABLET | Freq: Every day | ORAL | 0 refills | Status: AC
  Filled 2024-06-10: qty 30, 30d supply, fill #0

## 2024-06-10 MED ORDER — EZETIMIBE 10 MG PO TABS
10.0000 mg | ORAL_TABLET | Freq: Every day | ORAL | 0 refills | Status: AC
  Filled 2024-06-10: qty 30, 30d supply, fill #0

## 2024-06-10 MED ORDER — ASPIRIN 81 MG PO TBEC
81.0000 mg | DELAYED_RELEASE_TABLET | Freq: Every day | ORAL | Status: DC
  Administered 2024-06-10: 81 mg via ORAL
  Filled 2024-06-10: qty 1

## 2024-06-10 MED ORDER — REGADENOSON 0.4 MG/5ML IV SOLN
INTRAVENOUS | Status: AC
  Filled 2024-06-10: qty 5

## 2024-06-10 MED ORDER — TECHNETIUM TC 99M TETROFOSMIN IV KIT
30.0000 | PACK | Freq: Once | INTRAVENOUS | Status: AC | PRN
  Administered 2024-06-10: 32 via INTRAVENOUS

## 2024-06-10 MED ORDER — REGADENOSON 0.4 MG/5ML IV SOLN
0.4000 mg | Freq: Once | INTRAVENOUS | Status: AC
  Administered 2024-06-10: 0.4 mg via INTRAVENOUS
  Filled 2024-06-10: qty 5

## 2024-06-10 MED ORDER — MAGNESIUM SULFATE 2 GM/50ML IV SOLN
2.0000 g | Freq: Once | INTRAVENOUS | Status: AC
  Administered 2024-06-10: 2 g via INTRAVENOUS
  Filled 2024-06-10: qty 50

## 2024-06-10 MED ORDER — EZETIMIBE 10 MG PO TABS
10.0000 mg | ORAL_TABLET | Freq: Every day | ORAL | Status: DC
  Administered 2024-06-10: 10 mg via ORAL
  Filled 2024-06-10: qty 1

## 2024-06-10 MED ORDER — METOPROLOL SUCCINATE ER 25 MG PO TB24
12.5000 mg | ORAL_TABLET | Freq: Every day | ORAL | 0 refills | Status: AC
  Filled 2024-06-10: qty 15, 30d supply, fill #0

## 2024-06-10 MED ORDER — TECHNETIUM TC 99M TETROFOSMIN IV KIT
10.0000 | PACK | Freq: Once | INTRAVENOUS | Status: AC | PRN
  Administered 2024-06-10: 9.34 via INTRAVENOUS

## 2024-06-10 NOTE — Progress Notes (Addendum)
 "  Progress Note  Patient Name: Leroy Campbell. Date of Encounter: 06/10/2024 [facility] Cardiologist: Lynwood Schilling, MD   Interval Summary   Patient doing well this morning. Had constant dull aching chest pain and one episode of sharp chest pain that lasted ~10-20 seconds overnight. Few brief instances of palpitations overnight as well. Denies any dyspnea  Vital Signs Vitals:   06/09/24 2220 06/09/24 2332 06/10/24 0530 06/10/24 0827  BP: 129/81 126/68 105/64 120/72  Pulse: (!) 59  (!) 57 (!) 59  Resp:  17 18 17   Temp:  98.3 F (36.8 C) 97.9 F (36.6 C) 97.9 F (36.6 C)  TempSrc:  Oral Oral Oral  SpO2:   94% 94%  Weight:      Height:        Intake/Output Summary (Last 24 hours) at 06/10/2024 0856 Last data filed at 06/09/2024 1200 Gross per 24 hour  Intake 130 ml  Output --  Net 130 ml      06/08/2024   11:11 PM 06/08/2024    1:52 PM 07/29/2022    3:28 PM  Last 3 Weights  Weight (lbs) 211 lb 11.2 oz 209 lb 212 lb  Weight (kg) 96.026 kg 94.802 kg 96.163 kg      Telemetry/ECG  Sinus bradycardia in the 50s, frequent PVCs - Personally Reviewed  Physical Exam  GEN: Resting in bed in no acute distress.   Neck: No JVD Cardiac: RRR, no murmurs, rubs, or gallops.  Respiratory: Clear to auscultation bilaterally. GI: Soft, nontender, non-distended  MS: No peripheral edema  Assessment & Plan  Atypical chest pain CAD with history of MI s/p DES to RCA in 2022 with patent stent in 2023 as well as 2024 and nonischemic Myoview  last year in 2025. Presents with chest pain with some anginal features based on similarities to his prior MI chest pain and dull/aching/squeezing character, however also atypical features including constant duration and unusual radiation of pain Negative troponins x2. No significant ST-T wave changes on EKG Last echo at [facility] in May 2024 showed EF 60-65%. Echo this admission pending Chest pain appears atypical as described above, though of some  concern with patient reporting similar chest pain to past MI. With recent caths in 2023 and 2024 showing patent stents and stable disease, reasonable to pursue ischemic evaluation this admission with a Lexiscan  Myoview . Scheduled for today, has been NPO since midnight Previously on Imdur  with good symptom relief, however was stopped a year ago by [facility] cardiologist. Will restart Imdur  30 mg daily Continue ASA, atorvastatin  80 mg daily, Toprol -XL 12.5 mg daily    Frequent PVCs Seen on telemetry, minimally symptomatic with palpitations per pt Continue Toprol -XL 12.5 mg daily with hold parameters  Chronic HFpEF Stable, appears euvolemic on exam Last echo with preserved EF, normal RV Echo this admission pending Continue home losartan  25 mg daily, Toprol -XL 12.5 mg daily   Hypertension Initially slightly elevated, now soft/stable Continue on home antihypertensives as ordered   Hyperlipidemia Lipid panel this admission showed total cholesterol 118, HDL 36, LDL 68. Goal LDL <55 Continue home atorvastatin  80 mg daily  For questions or updates, please contact [facility] Please consult [email or web address] for contact info under         Signed, Owen MARLA Daniels, PA-C    ATTENDING ATTESTATION  I have seen, examined and evaluated the patient this morning on rounds along with Hanh K Le, PA-C.  After reviewing all the available data and chart, we discussed  the patients laboratory, study & physical findings as well as symptoms in detail.  I agree with her findings, examination as well as impression recommendations as per our discussion.    Doing well.  Chest pain resolved.  Plan for Myoview  today.  Further plans based on results.  If no evidence of ischemia, would suggest that his chest pain is noncardiac in nature and would not require any further cardiac management.  However if it is abnormal, would likely potentially require further invasive evaluation.  We will monitor for the results of the  study and keep you informed.  He would like to follow-up with me in the outpatient setting.     Alm MICAEL Clay, MD, MS Alm Clay, M.D., M.S. Interventional Cardiologist  [facility] Pager # [phone]     "

## 2024-06-10 NOTE — Progress Notes (Signed)
 Mobility Specialist Progress Note;   06/10/24 0950  Mobility  Activity Ambulated independently  Level of Assistance Standby assist, set-up cues, supervision of patient - no hands on  Assistive Device None  Distance Ambulated (ft) 450 ft  Activity Response Tolerated well  Mobility Referral Yes  Mobility visit 1 Mobility  Mobility Specialist Start Time (ACUTE ONLY) 0950  Mobility Specialist Stop Time (ACUTE ONLY) 1000  Mobility Specialist Time Calculation (min) (ACUTE ONLY) 10 min   Patient received in bed agreeable to participate in mobility. No physical assistance required, SV for safety. Ambulated with steady gait. Pt stated his chest still felt slightly tight but ambulating does not increase any pain. HR within [facility]. Pt was left in BR and instructed to use call light if assistance is needed. All needs met.   Florine Oak Mobility Specialist Please Neurosurgeon or [employer] [phone]

## 2024-06-10 NOTE — Progress Notes (Signed)
" ° °  Patient's underwent Lexiscan  Myoview  today, results consistent with known prior infarct. No evidence of new ischemia or infarct, suggests that chest pain is noncardiac in nature. Will continue current medical management with no further ischemic evaluation at this time. OK to discharge from cardiology standpoint. Will arrange outpatient cardiology follow up. "

## 2024-06-10 NOTE — Plan of Care (Signed)
" °  Problem: Education: Goal: Knowledge of General Education information will improve Description: Including pain rating scale, medication(s)/side effects and non-pharmacologic comfort measures Outcome: Adequate for Discharge   Problem: Health Behavior/Discharge Planning: Goal: Ability to manage health-related needs will improve Outcome: Adequate for Discharge   Problem: Clinical Measurements: Goal: Ability to maintain clinical measurements within normal limits will improve Outcome: Adequate for Discharge Goal: Will remain free from infection Outcome: Adequate for Discharge Goal: Diagnostic test results will improve Outcome: Adequate for Discharge Goal: Respiratory complications will improve Outcome: Adequate for Discharge Goal: Cardiovascular complication will be avoided Outcome: Adequate for Discharge   Problem: Activity: Goal: Risk for activity intolerance will decrease Outcome: Adequate for Discharge   Problem: Coping: Goal: Level of anxiety will decrease Outcome: Adequate for Discharge   Problem: Pain Managment: Goal: General experience of comfort will improve and/or be controlled Outcome: Adequate for Discharge   "

## 2024-06-10 NOTE — Plan of Care (Signed)
" °  Problem: Education: Goal: Knowledge of General Education information will improve Description: Including pain rating scale, medication(s)/side effects and non-pharmacologic comfort measures Outcome: Progressing   Problem: Health Behavior/Discharge Planning: Goal: Ability to manage health-related needs will improve Outcome: Progressing   Problem: Clinical Measurements: Goal: Will remain free from infection Outcome: Progressing   Problem: Clinical Measurements: Goal: Diagnostic test results will improve Outcome: Progressing    Problem: Clinical Measurements: Goal: Cardiovascular complication will be avoided Outcome: Progressing   Problem: Pain Managment: Goal: General experience of comfort will improve and/or be controlled Outcome: Progressing   "

## 2024-06-10 NOTE — Progress Notes (Addendum)
" °  ° °  Leroy D Rehfeld Jr. presented for a  nuclear stress test today.  I Waddell DELENA Donath, PA-C, provided direct supervision and was present during the stress portion of the study today, which was completed without significant symptoms, immediate complications, or acute ST/T changes on ECG.  Stress imaging is pending at this time.  Preliminary ECG findings may be listed in the chart, but the stress test result will not be finalized until perfusion imaging is complete.  Of note, patient underwent Lexiscan  stress test on different machine that did not have Lexiscan  programmed in so EKGs from study relabeled with adenosine however confirmed that he was given Lexiscan  for stress test today.  Waddell DELENA Donath, PA-C  06/10/2024, 12:59 PM    "

## 2024-06-10 NOTE — Discharge Summary (Signed)
 Physician Discharge Summary  Leroy Campbell. FMW:[id number] DOB: [birth year] DOA: 06/08/2024  PCP: Elena Franky LABOR, PA (Inactive)  Admit date: 06/08/2024 Discharge date: 06/10/2024  Admitted From: Home Disposition:  Home  Recommendations for Outpatient Follow-up:  Follow up with PCP in 1-2 weeks Follow up with cardiology as scheduled  Home Health:None  Equipment/Devices:None  Discharge Condition:Stable  CODE STATUS:Full  Diet recommendation: Low salt low fat diet    Brief/Interim Summary: Leroy Jolliff. is a 64 y.o. male with medical history significant of CAD, HLD, HTN , GERD, gout, Thrombocytopenia Splenomegaly , PTSD   Patient presents with chest pain reported to be sharp subsequently transition to a squeezing pressure-like pain over his left chest and notes concurrent left shoulder pain.  While his biomarkers and EKG are reassuring given his worrisome symptoms and self-reported prior history of partially blocked left heart blood vessels on cardiac cath cardiology was consulted for further evaluation and treatment.  Last cath available per our records April 2024 showed mild nonobstructive CAD widely patent stent in mid RCA with normal LV systolic function and normal EF of 65%.  Given history cardiology was consulted recommending stress test - underwent lexiscan  today with intermediate risk showing old infarct but no new areas of concern. Otherwise stable for discharge from cardiology standpoint. Medication changes as outlined below, follow up outpatient as scheduled.  Discharge Diagnoses:  Principal Problem:   Chest pain with moderate risk for cardiac etiology Active Problems:   Hyperlipidemia   Coronary artery disease   Chronic diastolic CHF (congestive heart failure) (HCC)   PTSD (post-traumatic stress disorder)   Back pain   Primary hypertension  Acute onset atypical chest pain -Troponin and EKG reassuring -Echo/lexiscan  consistent with prior history -Symptoms  improved but not resolved -cardiology consulted - appreciate insight/recs -> outpatient follow up as scheduled   CAD in native artery On aspirin  81 mg daily Lipitor  80 mg and Toprol  12.5 mg daily -Prior cath with minimally obstructive disease 2024 -Lexiscan  as above - without new findings   Hyperlipidemia Continue Lipitor  80 mg a day   Chronic diastolic CHF (congestive heart failure) (HCC) Chronic stable currently appears to be euvolemic   PTSD (post-traumatic stress disorder) Continue Zoloft  100 mg a day   Chronic lumbago  Patient is scheduled to undergo MRI through [facility] supportive management Patient denies any neurological signs at this time  Discharge Instructions  Discharge Instructions     Call MD for:  difficulty breathing, headache or visual disturbances   Complete by: As directed    Call MD for:  extreme fatigue   Complete by: As directed    Call MD for:  hives   Complete by: As directed    Call MD for:  persistant dizziness or light-headedness   Complete by: As directed    Call MD for:  persistant nausea and vomiting   Complete by: As directed    Call MD for:  severe uncontrolled pain   Complete by: As directed    Call MD for:  temperature >100.4   Complete by: As directed    Increase activity slowly   Complete by: As directed       Allergies as of 06/10/2024       Reactions   Bee Venom Anaphylaxis, Swelling   Brilinta  [ticagrelor ] Anaphylaxis, Hives        Medication List     TAKE these medications    allopurinol  100 MG tablet Commonly known as: ZYLOPRIM  Take 100 mg by  mouth 2 (two) times daily.   aspirin  81 MG chewable tablet Chew 1 tablet (81 mg total) by mouth daily.   atorvastatin  80 MG tablet Commonly known as: LIPITOR  Take 1 tablet (80 mg total) by mouth daily.   colchicine 0.6 MG tablet Take 0.6 mg by mouth daily as needed (gout).   eszopiclone  2 MG Tabs tablet Commonly known as: LUNESTA  Take 1 tablet (2 mg total) by mouth at  bedtime as needed for sleep. Take immediately before bedtime What changed:  when to take this additional instructions   ezetimibe  10 MG tablet Commonly known as: ZETIA  Take 1 tablet (10 mg total) by mouth daily. Start taking on: June 11, 2024   isosorbide  mononitrate 30 MG 24 hr tablet Commonly known as: IMDUR  Take 1 tablet (30 mg total) by mouth daily. Start taking on: June 11, 2024   losartan  25 MG tablet Commonly known as: COZAAR  Take 1 tablet by mouth once daily   metoprolol  succinate 25 MG 24 hr tablet Commonly known as: TOPROL -XL Take 0.5 tablets (12.5 mg total) by mouth at bedtime. What changed: See the new instructions.   nitroGLYCERIN  0.4 MG SL tablet Commonly known as: NITROSTAT  Place 1 tablet (0.4 mg total) under the tongue every 5 (five) minutes x 3 doses as needed for chest pain.   pantoprazole  40 MG tablet Commonly known as: PROTONIX  Take 40 mg by mouth 2 (two) times daily.   sertraline  100 MG tablet Commonly known as: ZOLOFT  Take 100 mg by mouth daily.        Allergies[1]  Consultations: Cardiology   Procedures/Studies: NM Myocar Multi W/Spect W/Wall Motion / EF Result Date: 06/10/2024   LV perfusion is abnormal. There is no evidence of ischemia. There is evidence of infarction. Defect 1: There is a medium defect with moderate reduction in uptake present in the mid to basal inferior and inferolateral location(s) that is fixed. There is abnormal wall motion in the defect area. Consistent with infarction.   Left ventricular function is normal. Nuclear stress EF: 75%. The left ventricular ejection fraction is hyperdynamic (>65%). End diastolic cavity size is normal.   Findings are consistent with infarction. The study is intermediate risk.   ECHOCARDIOGRAM COMPLETE Result Date: 06/10/2024    ECHOCARDIOGRAM REPORT   Patient Name:   Leroy Campbell. Date of Exam: 06/10/2024 Medical Rec #:  [id number]           Height:       72.0 in Accession #:     [id number]          Weight:       211.6 lb Date of Birth:  March 27, 1961           BSA:          2.183 m Patient Age:    64 years            BP:           130/76 mmHg Patient Gender: M                   HR:           64 bpm. Exam Location:  Inpatient Procedure: 2D Echo, Cardiac Doppler and Color Doppler (Both Spectral and Color            Flow Doppler were utilized during procedure). Indications:    Chest pain  History:        Patient has prior history of Echocardiogram examinations, most  recent 10/13/2021. Cardiomyopathy and CHF, CAD and Previous                 Myocardial Infarction; Risk Factors:Dyslipidemia and                 Hypertension.  Sonographer:    Odella Brewster Referring Phys: [id number] ANASTASSIA DOUTOVA  Sonographer Comments: Image acquisition challenging due to respiratory motion. Global longitudinal strain was attempted. IMPRESSIONS  1. Left ventricular ejection fraction, by estimation, is 55 to 60%. Left ventricular ejection fraction by 3D volume is 58 %. The left ventricle has normal function. The left ventricle has no regional wall motion abnormalities. There is mild left ventricular hypertrophy. Left ventricular diastolic parameters are consistent with Grade I diastolic dysfunction (impaired relaxation). The average left ventricular global longitudinal strain is -19.9 %. The global longitudinal strain is normal.  2. Right ventricular systolic function is normal. The right ventricular size is normal.  3. The mitral valve is grossly normal. No evidence of mitral valve regurgitation.  4. The aortic valve is tricuspid. Aortic valve regurgitation is trivial. Aortic valve sclerosis is present, with no evidence of aortic valve stenosis.  5. The inferior vena cava is normal in size with greater than 50% respiratory variability, suggesting right atrial pressure of 3 mmHg. Comparison(s): No significant change from prior study. 10/13/2021: LVEF 55-60%. FINDINGS  Left Ventricle: Left ventricular  ejection fraction, by estimation, is 55 to 60%. Left ventricular ejection fraction by 3D volume is 58 %. The left ventricle has normal function. The left ventricle has no regional wall motion abnormalities. The average left ventricular global longitudinal strain is -19.9 %. Strain was performed and the global longitudinal strain is normal. The left ventricular internal cavity size was normal in size. There is mild left ventricular hypertrophy. Left ventricular diastolic parameters are consistent with Grade I diastolic dysfunction (impaired relaxation). Indeterminate filling pressures. Right Ventricle: The right ventricular size is normal. No increase in right ventricular wall thickness. Right ventricular systolic function is normal. Left Atrium: Left atrial size was normal in size. Right Atrium: Right atrial size was normal in size. Pericardium: There is no evidence of pericardial effusion. Mitral Valve: The mitral valve is grossly normal. No evidence of mitral valve regurgitation. MV peak gradient, 5.0 mmHg. The mean mitral valve gradient is 2.0 mmHg. Tricuspid Valve: The tricuspid valve is normal in structure. Tricuspid valve regurgitation is not demonstrated. Aortic Valve: The aortic valve is tricuspid. Aortic valve regurgitation is trivial. Aortic valve sclerosis is present, with no evidence of aortic valve stenosis. Aortic valve mean gradient measures 4.0 mmHg. Aortic valve peak gradient measures 7.0 mmHg. Aortic valve area, by VTI measures 3.23 cm. Pulmonic Valve: The pulmonic valve was normal in structure. Pulmonic valve regurgitation is not visualized. Aorta: The aortic root and ascending aorta are structurally normal, with no evidence of dilitation. Venous: The inferior vena cava is normal in size with greater than 50% respiratory variability, suggesting right atrial pressure of 3 mmHg. IAS/Shunts: No atrial level shunt detected by color flow Doppler. Additional Comments: 3D was performed not requiring  image post processing on an independent workstation and was normal.  LEFT VENTRICLE PLAX 2D LVIDd:         4.20 cm         Diastology LVIDs:         3.10 cm         LV e' medial:    6.64 cm/s LV PW:  1.20 cm         LV E/e' medial:  11.8 LV IVS:        1.10 cm         LV e' lateral:   10.70 cm/s LVOT diam:     2.20 cm         LV E/e' lateral: 7.3 LV SV:         80 LV SV Index:   37              2D Longitudinal LVOT Area:     3.80 cm        Strain LV IVRT:       113 msec        2D Strain GLS   -20.2 %                                (A4C):                                2D Strain GLS   -18.4 % LV Volumes (MOD)               (A3C): LV vol d, MOD    137.0 ml      2D Strain GLS   -21.1 % A2C:                           (A2C): LV vol d, MOD    144.0 ml      2D Strain GLS   -19.9 % A4C:                           Avg: LV vol s, MOD    57.3 ml A2C:                           3D Volume EF LV vol s, MOD    52.2 ml       LV 3D EF:    Left A4C:                                        ventricul LV SV MOD A2C:   79.7 ml                    ar LV SV MOD A4C:   144.0 ml                   ejection LV SV MOD BP:    85.7 ml                    fraction                                             by 3D                                             volume is  58 %.                                 3D Volume EF:                                3D EF:        58 %                                LV EDV:       135 ml                                LV ESV:       56 ml                                LV SV:        78 ml RIGHT VENTRICLE             IVC RV S prime:     11.10 cm/s  IVC diam: 1.80 cm TAPSE (M-mode): 2.0 cm                             PULMONARY VEINS                             Diastolic Velocity: 27.70 cm/s                             S/D Velocity:       2.00                             Systolic Velocity:  56.60 cm/s LEFT ATRIUM             Index        RIGHT ATRIUM           Index LA diam:         3.90 cm 1.79 cm/m   RA Area:     13.20 cm LA Vol (A2C):   55.5 ml 25.42 ml/m  RA Volume:   28.80 ml  13.19 ml/m LA Vol (A4C):   36.9 ml 16.90 ml/m LA Biplane Vol: 46.1 ml 21.12 ml/m  AORTIC VALVE                    PULMONIC VALVE AV Area (Vmax):    3.08 cm     PV Vmax:       1.10 m/s AV Area (Vmean):   2.92 cm     PV Peak grad:  4.8 mmHg AV Area (VTI):     3.23 cm AV Vmax:           132.00 cm/s AV Vmean:          92.800 cm/s AV VTI:            0.247 m AV Peak Grad:      7.0 mmHg AV Mean Grad:      4.0 mmHg LVOT Vmax:  107.00 cm/s LVOT Vmean:        71.400 cm/s LVOT VTI:          0.210 m LVOT/AV VTI ratio: 0.85  AORTA Ao Root diam: 3.60 cm Ao Asc diam:  3.50 cm MITRAL VALVE MV Area (PHT): 2.97 cm    SHUNTS MV Area VTI:   2.11 cm    Systemic VTI:  0.21 m MV Peak grad:  5.0 mmHg    Systemic Diam: 2.20 cm MV Mean grad:  2.0 mmHg MV Vmax:       1.12 m/s MV Vmean:      66.0 cm/s MV Decel Time: 255 msec MV E velocity: 78.10 cm/s MV A velocity: 91.20 cm/s MV E/A ratio:  0.86 Vinie Maxcy MD Electronically signed by Vinie Maxcy MD Signature Date/Time: 06/10/2024/2:02:04 PM    Final    DG Chest 2 View Result Date: 06/08/2024 CLINICAL DATA:  Chest pain EXAM: CHEST - 2 VIEW COMPARISON:  None Available. FINDINGS: The lungs are clear and negative for focal airspace consolidation, pulmonary edema or suspicious pulmonary nodule. No pleural effusion or pneumothorax. Cardiac and mediastinal contours are within normal limits. No acute fracture or lytic or blastic osseous lesions. The visualized upper abdominal bowel gas pattern is unremarkable. IMPRESSION: Negative chest x-ray. Electronically Signed   By: Wilkie Lent M.D.   On: 06/08/2024 14:43     Subjective: No acute issues/events overnight   Discharge Exam: Vitals:   06/10/24 1251 06/10/24 1253  BP: 114/64 (!) 104/54  Pulse:    Resp:    Temp:    SpO2:     Vitals:   06/10/24 1244 06/10/24 1249 06/10/24 1251 06/10/24 1253  BP: 103/67  (!) 84/45 114/64 (!) 104/54  Pulse: (!) 59     Resp:      Temp:      TempSrc:      SpO2:      Weight:      Height:        General: Pt is alert, awake, not in acute distress Cardiovascular: RRR, S1/S2 +, no rubs, no gallops Respiratory: CTA bilaterally, no wheezing, no rhonchi Abdominal: Soft, NT, ND, bowel sounds + Extremities: no edema, no cyanosis    The results of significant diagnostics from this hospitalization (including imaging, microbiology, ancillary and laboratory) are listed below for reference.     Microbiology: No results found for this or any previous visit (from the past 240 hours).   Labs: BNP (last 3 results) No results for input(s): BNP in the last 8760 hours. Basic Metabolic Panel: Recent Labs  Lab 06/08/24 1354 06/09/24 0538 06/10/24 1011  NA 141 141 139  K 4.4 4.6 4.8  CL 104 104 101  CO2 25 28 30   GLUCOSE 89 85 99  BUN 13 15 15   CREATININE 0.93 1.00 0.88  CALCIUM  9.2 9.0 9.7  MG  --  1.9 2.0  PHOS  --  4.4  --    Liver Function Tests: Recent Labs  Lab 06/08/24 1354 06/09/24 0538  AST 27 28  ALT 16 16  ALKPHOS 85 84  BILITOT 0.7 0.7  PROT 6.5 6.1*  ALBUMIN 4.4 4.0   Recent Labs  Lab 06/08/24 1535  LIPASE 60*   No results for input(s): AMMONIA in the last 168 hours. CBC: Recent Labs  Lab 06/08/24 1354 06/09/24 0538  WBC 5.2 4.4  HGB 14.6 14.1  HCT 42.2 40.4  MCV 85.6 85.8  PLT 146* 140*   Cardiac Enzymes: Recent  Labs  Lab 06/09/24 0022  CKTOTAL 77   BNP: Invalid input(s): POCBNP CBG: No results for input(s): GLUCAP in the last 168 hours. D-Dimer Recent Labs    06/08/24 1535  DDIMER 0.30   Hgb A1c No results for input(s): HGBA1C in the last 72 hours. Lipid Profile Recent Labs    06/09/24 0538  CHOL 118  HDL 36*  LDLCALC 68  TRIG 72  CHOLHDL 3.3   Thyroid  function studies No results for input(s): TSH, T4TOTAL, T3FREE, THYROIDAB in the last 72 hours.  Invalid input(s):  FREET3 Anemia work up No results for input(s): VITAMINB12, FOLATE, FERRITIN, TIBC, IRON, RETICCTPCT in the last 72 hours. Urinalysis    Component Value Date/Time   BILIRUBINUR 1+ 11/17/2015 1130   PROTEINUR neg 11/17/2015 1130   UROBILINOGEN 0.2 11/17/2015 1130   NITRITE neg 11/17/2015 1130   LEUKOCYTESUR Negative 11/17/2015 1130   Sepsis Labs Recent Labs  Lab 06/08/24 1354 06/09/24 0538  WBC 5.2 4.4   Microbiology No results found for this or any previous visit (from the past 240 hours).   Time coordinating discharge: Over 30 minutes  SIGNED:   Elsie JAYSON Montclair, DO [facility] 06/10/2024, 4:58 PM Pager   If 7PM-7AM, please contact night-coverage [email or web address]     [1]  Allergies Allergen Reactions   Bee Venom Anaphylaxis and Swelling   Brilinta  [Ticagrelor ] Anaphylaxis and Hives

## 2024-06-22 ENCOUNTER — Ambulatory Visit: Admitting: Physician Assistant
